# Patient Record
Sex: Female | Born: 1940 | Race: White | Hispanic: No | Smoking: Never smoker
Health system: Southern US, Community
[De-identification: ages and names within clinical notes are randomized; demographics above are authoritative.]

## PROBLEM LIST (undated history)

## (undated) DIAGNOSIS — Z8674 Personal history of sudden cardiac arrest: Secondary | ICD-10-CM

## (undated) DIAGNOSIS — Z90710 Acquired absence of both cervix and uterus: Secondary | ICD-10-CM

## (undated) DIAGNOSIS — R601 Generalized edema: Secondary | ICD-10-CM

## (undated) DIAGNOSIS — J189 Pneumonia, unspecified organism: Secondary | ICD-10-CM

## (undated) DIAGNOSIS — I2510 Atherosclerotic heart disease of native coronary artery without angina pectoris: Secondary | ICD-10-CM

## (undated) DIAGNOSIS — I10 Essential (primary) hypertension: Secondary | ICD-10-CM

## (undated) DIAGNOSIS — J449 Chronic obstructive pulmonary disease, unspecified: Secondary | ICD-10-CM

## (undated) DIAGNOSIS — J9600 Acute respiratory failure, unspecified whether with hypoxia or hypercapnia: Secondary | ICD-10-CM

## (undated) DIAGNOSIS — I219 Acute myocardial infarction, unspecified: Secondary | ICD-10-CM

## (undated) DIAGNOSIS — Z9049 Acquired absence of other specified parts of digestive tract: Secondary | ICD-10-CM

## (undated) DIAGNOSIS — R579 Shock, unspecified: Secondary | ICD-10-CM

## (undated) DIAGNOSIS — K922 Gastrointestinal hemorrhage, unspecified: Secondary | ICD-10-CM

## (undated) DIAGNOSIS — E876 Hypokalemia: Secondary | ICD-10-CM

## (undated) DIAGNOSIS — Z951 Presence of aortocoronary bypass graft: Secondary | ICD-10-CM

## (undated) DIAGNOSIS — I509 Heart failure, unspecified: Secondary | ICD-10-CM

## (undated) DIAGNOSIS — E46 Unspecified protein-calorie malnutrition: Secondary | ICD-10-CM

## (undated) DIAGNOSIS — K632 Fistula of intestine: Secondary | ICD-10-CM

## (undated) DIAGNOSIS — N189 Chronic kidney disease, unspecified: Secondary | ICD-10-CM

## (undated) DIAGNOSIS — D759 Disease of blood and blood-forming organs, unspecified: Secondary | ICD-10-CM

## (undated) DIAGNOSIS — D649 Anemia, unspecified: Secondary | ICD-10-CM

## (undated) DIAGNOSIS — E872 Acidosis, unspecified: Secondary | ICD-10-CM

## (undated) DIAGNOSIS — N390 Urinary tract infection, site not specified: Secondary | ICD-10-CM

## (undated) DIAGNOSIS — Z8719 Personal history of other diseases of the digestive system: Secondary | ICD-10-CM

## (undated) DIAGNOSIS — A809 Acute poliomyelitis, unspecified: Secondary | ICD-10-CM

## (undated) DIAGNOSIS — Z9889 Other specified postprocedural states: Secondary | ICD-10-CM

## (undated) HISTORY — PX: ABDOMINAL HYSTERECTOMY: SHX81

## (undated) HISTORY — PX: OTHER SURGICAL HISTORY: SHX169

## (undated) HISTORY — PX: TONSILLECTOMY: SUR1361

---
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
## 1996-06-06 HISTORY — PX: CORONARY ARTERY BYPASS GRAFT: SHX141

## 1998-06-16 ENCOUNTER — Observation Stay (HOSPITAL_COMMUNITY): Admission: AD | Admit: 1998-06-16 | Discharge: 1998-06-17 | Payer: Self-pay | Admitting: Cardiology

## 1998-06-16 ENCOUNTER — Encounter: Payer: Self-pay | Admitting: Cardiology

## 2005-10-13 ENCOUNTER — Encounter: Admission: RE | Admit: 2005-10-13 | Discharge: 2005-10-13 | Payer: Self-pay | Admitting: Neurosurgery

## 2005-10-27 ENCOUNTER — Encounter: Admission: RE | Admit: 2005-10-27 | Discharge: 2005-10-27 | Payer: Self-pay | Admitting: Neurosurgery

## 2005-11-16 ENCOUNTER — Encounter: Admission: RE | Admit: 2005-11-16 | Discharge: 2005-11-16 | Payer: Self-pay | Admitting: Neurosurgery

## 2013-08-04 DIAGNOSIS — D649 Anemia, unspecified: Secondary | ICD-10-CM

## 2013-08-04 DIAGNOSIS — J189 Pneumonia, unspecified organism: Secondary | ICD-10-CM

## 2013-08-04 DIAGNOSIS — D759 Disease of blood and blood-forming organs, unspecified: Secondary | ICD-10-CM

## 2013-08-04 DIAGNOSIS — J9600 Acute respiratory failure, unspecified whether with hypoxia or hypercapnia: Secondary | ICD-10-CM

## 2013-08-04 DIAGNOSIS — R579 Shock, unspecified: Secondary | ICD-10-CM

## 2013-08-04 DIAGNOSIS — E46 Unspecified protein-calorie malnutrition: Secondary | ICD-10-CM

## 2013-08-04 HISTORY — DX: Shock, unspecified: R57.9

## 2013-08-04 HISTORY — DX: Anemia, unspecified: D64.9

## 2013-08-04 HISTORY — DX: Disease of blood and blood-forming organs, unspecified: D75.9

## 2013-08-04 HISTORY — DX: Pneumonia, unspecified organism: J18.9

## 2013-08-04 HISTORY — DX: Unspecified protein-calorie malnutrition: E46

## 2013-08-04 HISTORY — DX: Acute respiratory failure, unspecified whether with hypoxia or hypercapnia: J96.00

## 2013-08-07 ENCOUNTER — Inpatient Hospital Stay (HOSPITAL_COMMUNITY): Payer: Medicare Other

## 2013-08-07 ENCOUNTER — Encounter (HOSPITAL_COMMUNITY): Payer: Self-pay | Admitting: General Practice

## 2013-08-07 ENCOUNTER — Inpatient Hospital Stay (HOSPITAL_COMMUNITY)
Admission: EM | Admit: 2013-08-07 | Discharge: 2013-09-04 | DRG: 004 | Disposition: E | Payer: Medicare Other | Source: Other Acute Inpatient Hospital | Attending: Pulmonary Disease | Admitting: Pulmonary Disease

## 2013-08-07 DIAGNOSIS — E2749 Other adrenocortical insufficiency: Secondary | ICD-10-CM | POA: Diagnosis not present

## 2013-08-07 DIAGNOSIS — E872 Acidosis, unspecified: Secondary | ICD-10-CM | POA: Diagnosis present

## 2013-08-07 DIAGNOSIS — B952 Enterococcus as the cause of diseases classified elsewhere: Secondary | ICD-10-CM | POA: Diagnosis present

## 2013-08-07 DIAGNOSIS — R609 Edema, unspecified: Secondary | ICD-10-CM

## 2013-08-07 DIAGNOSIS — K838 Other specified diseases of biliary tract: Secondary | ICD-10-CM | POA: Diagnosis not present

## 2013-08-07 DIAGNOSIS — R7309 Other abnormal glucose: Secondary | ICD-10-CM | POA: Diagnosis not present

## 2013-08-07 DIAGNOSIS — Z23 Encounter for immunization: Secondary | ICD-10-CM

## 2013-08-07 DIAGNOSIS — K560 Paralytic ileus: Secondary | ICD-10-CM | POA: Diagnosis present

## 2013-08-07 DIAGNOSIS — K7200 Acute and subacute hepatic failure without coma: Secondary | ICD-10-CM | POA: Diagnosis present

## 2013-08-07 DIAGNOSIS — I214 Non-ST elevation (NSTEMI) myocardial infarction: Secondary | ICD-10-CM | POA: Diagnosis present

## 2013-08-07 DIAGNOSIS — R601 Generalized edema: Secondary | ICD-10-CM

## 2013-08-07 DIAGNOSIS — G9341 Metabolic encephalopathy: Secondary | ICD-10-CM | POA: Diagnosis present

## 2013-08-07 DIAGNOSIS — K5732 Diverticulitis of large intestine without perforation or abscess without bleeding: Secondary | ICD-10-CM

## 2013-08-07 DIAGNOSIS — K632 Fistula of intestine: Secondary | ICD-10-CM

## 2013-08-07 DIAGNOSIS — N189 Chronic kidney disease, unspecified: Secondary | ICD-10-CM | POA: Diagnosis present

## 2013-08-07 DIAGNOSIS — J4489 Other specified chronic obstructive pulmonary disease: Secondary | ICD-10-CM | POA: Diagnosis present

## 2013-08-07 DIAGNOSIS — N179 Acute kidney failure, unspecified: Secondary | ICD-10-CM | POA: Diagnosis present

## 2013-08-07 DIAGNOSIS — J9600 Acute respiratory failure, unspecified whether with hypoxia or hypercapnia: Secondary | ICD-10-CM

## 2013-08-07 DIAGNOSIS — J449 Chronic obstructive pulmonary disease, unspecified: Secondary | ICD-10-CM | POA: Diagnosis present

## 2013-08-07 DIAGNOSIS — J9620 Acute and chronic respiratory failure, unspecified whether with hypoxia or hypercapnia: Secondary | ICD-10-CM | POA: Diagnosis present

## 2013-08-07 DIAGNOSIS — N170 Acute kidney failure with tubular necrosis: Secondary | ICD-10-CM | POA: Diagnosis present

## 2013-08-07 DIAGNOSIS — I129 Hypertensive chronic kidney disease with stage 1 through stage 4 chronic kidney disease, or unspecified chronic kidney disease: Secondary | ICD-10-CM | POA: Diagnosis present

## 2013-08-07 DIAGNOSIS — E8770 Fluid overload, unspecified: Secondary | ICD-10-CM | POA: Diagnosis present

## 2013-08-07 DIAGNOSIS — I509 Heart failure, unspecified: Secondary | ICD-10-CM | POA: Diagnosis present

## 2013-08-07 DIAGNOSIS — K5792 Diverticulitis of intestine, part unspecified, without perforation or abscess without bleeding: Secondary | ICD-10-CM | POA: Diagnosis present

## 2013-08-07 DIAGNOSIS — E46 Unspecified protein-calorie malnutrition: Secondary | ICD-10-CM

## 2013-08-07 DIAGNOSIS — R6520 Severe sepsis without septic shock: Secondary | ICD-10-CM

## 2013-08-07 DIAGNOSIS — E875 Hyperkalemia: Secondary | ICD-10-CM | POA: Diagnosis present

## 2013-08-07 DIAGNOSIS — J189 Pneumonia, unspecified organism: Secondary | ICD-10-CM | POA: Diagnosis present

## 2013-08-07 DIAGNOSIS — R6521 Severe sepsis with septic shock: Secondary | ICD-10-CM

## 2013-08-07 DIAGNOSIS — I252 Old myocardial infarction: Secondary | ICD-10-CM

## 2013-08-07 DIAGNOSIS — E440 Moderate protein-calorie malnutrition: Secondary | ICD-10-CM | POA: Diagnosis present

## 2013-08-07 DIAGNOSIS — E8779 Other fluid overload: Secondary | ICD-10-CM

## 2013-08-07 DIAGNOSIS — E871 Hypo-osmolality and hyponatremia: Secondary | ICD-10-CM | POA: Diagnosis not present

## 2013-08-07 DIAGNOSIS — I959 Hypotension, unspecified: Secondary | ICD-10-CM | POA: Diagnosis present

## 2013-08-07 DIAGNOSIS — Z79899 Other long term (current) drug therapy: Secondary | ICD-10-CM

## 2013-08-07 DIAGNOSIS — I2510 Atherosclerotic heart disease of native coronary artery without angina pectoris: Secondary | ICD-10-CM | POA: Diagnosis present

## 2013-08-07 DIAGNOSIS — Z951 Presence of aortocoronary bypass graft: Secondary | ICD-10-CM

## 2013-08-07 DIAGNOSIS — D696 Thrombocytopenia, unspecified: Secondary | ICD-10-CM | POA: Diagnosis present

## 2013-08-07 DIAGNOSIS — D649 Anemia, unspecified: Secondary | ICD-10-CM | POA: Diagnosis present

## 2013-08-07 DIAGNOSIS — A419 Sepsis, unspecified organism: Principal | ICD-10-CM | POA: Diagnosis present

## 2013-08-07 DIAGNOSIS — R570 Cardiogenic shock: Secondary | ICD-10-CM | POA: Diagnosis present

## 2013-08-07 DIAGNOSIS — J811 Chronic pulmonary edema: Secondary | ICD-10-CM

## 2013-08-07 DIAGNOSIS — J9690 Respiratory failure, unspecified, unspecified whether with hypoxia or hypercapnia: Secondary | ICD-10-CM | POA: Diagnosis present

## 2013-08-07 DIAGNOSIS — N390 Urinary tract infection, site not specified: Secondary | ICD-10-CM | POA: Diagnosis present

## 2013-08-07 DIAGNOSIS — Z66 Do not resuscitate: Secondary | ICD-10-CM | POA: Diagnosis present

## 2013-08-07 DIAGNOSIS — I5043 Acute on chronic combined systolic (congestive) and diastolic (congestive) heart failure: Secondary | ICD-10-CM | POA: Diagnosis present

## 2013-08-07 DIAGNOSIS — Z515 Encounter for palliative care: Secondary | ICD-10-CM

## 2013-08-07 DIAGNOSIS — R579 Shock, unspecified: Secondary | ICD-10-CM | POA: Diagnosis present

## 2013-08-07 HISTORY — DX: Chronic kidney disease, unspecified: N18.9

## 2013-08-07 HISTORY — DX: Acidosis, unspecified: E87.20

## 2013-08-07 HISTORY — DX: Generalized edema: R60.1

## 2013-08-07 HISTORY — DX: Unspecified protein-calorie malnutrition: E46

## 2013-08-07 HISTORY — DX: Acute poliomyelitis, unspecified: A80.9

## 2013-08-07 HISTORY — DX: Acute myocardial infarction, unspecified: I21.9

## 2013-08-07 HISTORY — DX: Acidosis: E87.2

## 2013-08-07 HISTORY — DX: Gastrointestinal hemorrhage, unspecified: K92.2

## 2013-08-07 HISTORY — DX: Hypokalemia: E87.6

## 2013-08-07 HISTORY — DX: Fistula of intestine: K63.2

## 2013-08-07 HISTORY — DX: Personal history of other diseases of the digestive system: Z87.19

## 2013-08-07 HISTORY — DX: Essential (primary) hypertension: I10

## 2013-08-07 HISTORY — DX: Atherosclerotic heart disease of native coronary artery without angina pectoris: I25.10

## 2013-08-07 HISTORY — DX: Shock, unspecified: R57.9

## 2013-08-07 HISTORY — DX: Heart failure, unspecified: I50.9

## 2013-08-07 HISTORY — DX: Personal history of sudden cardiac arrest: Z86.74

## 2013-08-07 HISTORY — DX: Acquired absence of both cervix and uterus: Z90.710

## 2013-08-07 HISTORY — DX: Chronic obstructive pulmonary disease, unspecified: J44.9

## 2013-08-07 HISTORY — DX: Urinary tract infection, site not specified: N39.0

## 2013-08-07 HISTORY — DX: Disease of blood and blood-forming organs, unspecified: D75.9

## 2013-08-07 HISTORY — DX: Acute respiratory failure, unspecified whether with hypoxia or hypercapnia: J96.00

## 2013-08-07 HISTORY — DX: Acquired absence of other specified parts of digestive tract: Z90.49

## 2013-08-07 HISTORY — DX: Other specified postprocedural states: Z98.890

## 2013-08-07 HISTORY — DX: Pneumonia, unspecified organism: J18.9

## 2013-08-07 HISTORY — DX: Presence of aortocoronary bypass graft: Z95.1

## 2013-08-07 HISTORY — DX: Anemia, unspecified: D64.9

## 2013-08-07 LAB — GLUCOSE, CAPILLARY
GLUCOSE-CAPILLARY: 142 mg/dL — AB (ref 70–99)
GLUCOSE-CAPILLARY: 126 mg/dL — AB (ref 70–99)
Glucose-Capillary: 106 mg/dL — ABNORMAL HIGH (ref 70–99)
Glucose-Capillary: 114 mg/dL — ABNORMAL HIGH (ref 70–99)

## 2013-08-07 LAB — URINALYSIS, ROUTINE W REFLEX MICROSCOPIC
Glucose, UA: 100 mg/dL — AB
Ketones, ur: 15 mg/dL — AB
Nitrite: POSITIVE — AB
PH: 7 (ref 5.0–8.0)
Protein, ur: >300 mg/dL — AB
Specific Gravity, Urine: 1.02 (ref 1.005–1.030)
Urobilinogen, UA: >8 mg/dL — ABNORMAL HIGH (ref 0.0–1.0)

## 2013-08-07 LAB — POCT I-STAT 3, ART BLOOD GAS (G3+)
Acid-base deficit: 7 mmol/L — ABNORMAL HIGH (ref 0.0–2.0)
Bicarbonate: 19 mEq/L — ABNORMAL LOW (ref 20.0–24.0)
O2 SAT: 92 %
TCO2: 20 mmol/L (ref 0–100)
pCO2 arterial: 37.3 mmHg (ref 35.0–45.0)
pH, Arterial: 7.315 — ABNORMAL LOW (ref 7.350–7.450)
pO2, Arterial: 67 mmHg — ABNORMAL LOW (ref 80.0–100.0)

## 2013-08-07 LAB — URINE MICROSCOPIC-ADD ON

## 2013-08-07 LAB — COMPREHENSIVE METABOLIC PANEL
ALK PHOS: 102 U/L (ref 39–117)
ALT: 38 U/L — ABNORMAL HIGH (ref 0–35)
AST: 446 U/L — AB (ref 0–37)
Albumin: 2.4 g/dL — ABNORMAL LOW (ref 3.5–5.2)
BILIRUBIN TOTAL: 1.6 mg/dL — AB (ref 0.3–1.2)
BUN: 87 mg/dL — ABNORMAL HIGH (ref 6–23)
CHLORIDE: 100 meq/L (ref 96–112)
CO2: 19 meq/L (ref 19–32)
Calcium: 7.9 mg/dL — ABNORMAL LOW (ref 8.4–10.5)
Creatinine, Ser: 2.31 mg/dL — ABNORMAL HIGH (ref 0.50–1.10)
GFR calc Af Amer: 23 mL/min — ABNORMAL LOW (ref >90)
GFR, EST NON AFRICAN AMERICAN: 20 mL/min — AB (ref >90)
Glucose, Bld: 116 mg/dL — ABNORMAL HIGH (ref 70–99)
Potassium: 5.7 mEq/L — ABNORMAL HIGH (ref 3.7–5.3)
Sodium: 136 mEq/L — ABNORMAL LOW (ref 137–147)
Total Protein: 4.6 g/dL — ABNORMAL LOW (ref 6.0–8.3)

## 2013-08-07 LAB — BASIC METABOLIC PANEL
BUN: 85 mg/dL — AB (ref 6–23)
CHLORIDE: 100 meq/L (ref 96–112)
CO2: 19 meq/L (ref 19–32)
Calcium: 7.9 mg/dL — ABNORMAL LOW (ref 8.4–10.5)
Creatinine, Ser: 2.29 mg/dL — ABNORMAL HIGH (ref 0.50–1.10)
GFR calc Af Amer: 24 mL/min — ABNORMAL LOW (ref >90)
GFR, EST NON AFRICAN AMERICAN: 20 mL/min — AB (ref >90)
GLUCOSE: 111 mg/dL — AB (ref 70–99)
POTASSIUM: 5.7 meq/L — AB (ref 3.7–5.3)
SODIUM: 135 meq/L — AB (ref 137–147)

## 2013-08-07 LAB — CBC
HCT: 26.3 % — ABNORMAL LOW (ref 36.0–46.0)
Hemoglobin: 9.3 g/dL — ABNORMAL LOW (ref 12.0–15.0)
MCH: 30.4 pg (ref 26.0–34.0)
MCHC: 35.4 g/dL (ref 30.0–36.0)
MCV: 85.9 fL (ref 78.0–100.0)
PLATELETS: 206 10*3/uL (ref 150–400)
RBC: 3.06 MIL/uL — ABNORMAL LOW (ref 3.87–5.11)
RDW: 17.6 % — AB (ref 11.5–15.5)
WBC: 34 10*3/uL — ABNORMAL HIGH (ref 4.0–10.5)

## 2013-08-07 LAB — MRSA PCR SCREENING: MRSA by PCR: NEGATIVE

## 2013-08-07 MED ORDER — FUROSEMIDE 10 MG/ML IJ SOLN
160.0000 mg | Freq: Four times a day (QID) | INTRAMUSCULAR | Status: DC
  Administered 2013-08-07 – 2013-08-08 (×3): 160 mg via INTRAVENOUS
  Filled 2013-08-07 (×5): qty 16

## 2013-08-07 MED ORDER — BIOTENE DRY MOUTH MT LIQD
15.0000 mL | Freq: Four times a day (QID) | OROMUCOSAL | Status: DC
  Administered 2013-08-07 – 2013-08-23 (×63): 15 mL via OROMUCOSAL

## 2013-08-07 MED ORDER — SODIUM CHLORIDE 0.9 % IV SOLN
INTRAVENOUS | Status: DC
  Administered 2013-08-07 (×2): via INTRAVENOUS
  Administered 2013-08-15: 250 mL via INTRAVENOUS
  Administered 2013-08-18 – 2013-08-22 (×3): via INTRAVENOUS

## 2013-08-07 MED ORDER — PANTOPRAZOLE SODIUM 40 MG IV SOLR
40.0000 mg | INTRAVENOUS | Status: DC
  Administered 2013-08-07 – 2013-08-22 (×16): 40 mg via INTRAVENOUS
  Filled 2013-08-07 (×17): qty 40

## 2013-08-07 MED ORDER — PNEUMOCOCCAL VAC POLYVALENT 25 MCG/0.5ML IJ INJ
0.5000 mL | INJECTION | INTRAMUSCULAR | Status: AC
  Administered 2013-08-08: 0.5 mL via INTRAMUSCULAR
  Filled 2013-08-07: qty 0.5

## 2013-08-07 MED ORDER — VANCOMYCIN HCL IN DEXTROSE 1-5 GM/200ML-% IV SOLN
1000.0000 mg | INTRAVENOUS | Status: DC
  Administered 2013-08-07 – 2013-08-10 (×4): 1000 mg via INTRAVENOUS
  Filled 2013-08-07 (×5): qty 200

## 2013-08-07 MED ORDER — SODIUM CHLORIDE 0.9 % IV SOLN
0.0000 ug/h | INTRAVENOUS | Status: DC
  Administered 2013-08-07: 100 ug/h via INTRAVENOUS
  Filled 2013-08-07 (×3): qty 50

## 2013-08-07 MED ORDER — SODIUM CHLORIDE 0.9 % IV SOLN
250.0000 mg | Freq: Three times a day (TID) | INTRAVENOUS | Status: DC
  Administered 2013-08-07 – 2013-08-08 (×5): 250 mg via INTRAVENOUS
  Filled 2013-08-07 (×6): qty 250

## 2013-08-07 MED ORDER — HEPARIN SODIUM (PORCINE) 5000 UNIT/ML IJ SOLN
5000.0000 [IU] | Freq: Three times a day (TID) | INTRAMUSCULAR | Status: DC
  Administered 2013-08-07 – 2013-08-10 (×8): 5000 [IU] via SUBCUTANEOUS
  Filled 2013-08-07 (×12): qty 1

## 2013-08-07 MED ORDER — FENTANYL BOLUS VIA INFUSION
25.0000 ug | INTRAVENOUS | Status: DC | PRN
  Filled 2013-08-07: qty 50

## 2013-08-07 MED ORDER — FENTANYL CITRATE 0.05 MG/ML IJ SOLN
50.0000 ug | Freq: Once | INTRAMUSCULAR | Status: DC
  Filled 2013-08-07: qty 2

## 2013-08-07 MED ORDER — CHLORHEXIDINE GLUCONATE 0.12 % MT SOLN
15.0000 mL | Freq: Two times a day (BID) | OROMUCOSAL | Status: DC
  Administered 2013-08-07 – 2013-08-23 (×34): 15 mL via OROMUCOSAL
  Filled 2013-08-07 (×31): qty 15

## 2013-08-07 MED FILL — Fentanyl Citrate Inj 0.05 MG/ML: INTRAMUSCULAR | Qty: 2 | Status: AC

## 2013-08-07 MED FILL — Midazolam HCl Inj 5 MG/5ML (Base Equivalent): INTRAMUSCULAR | Qty: 5 | Status: AC

## 2013-08-07 NOTE — Progress Notes (Signed)
ANTIBIOTIC CONSULT NOTE - INITIAL  Pharmacy Consult for vancomycin and imipenem Indication: intraabdominal infection; UTI with enterococcus  Allergies  Allergen Reactions  . Cephalexin Other (See Comments)    Unknown  . Codeine Nausea And Vomiting    Patient Measurements: Height: 5\' 1"  (154.9 cm) Weight: 189 lb 9.5 oz (86 kg) IBW/kg (Calculated) : 47.8   Vital Signs: Temp: 97.8 F (36.6 C) (03/04 1124) Temp src: Oral (03/04 1124) BP: 118/70 mmHg (03/04 1530) Pulse Rate: 76 (03/04 1530) Intake/Output from previous day:   Intake/Output from this shift: Total I/O In: 70 [I.V.:40; NG/GT:30] Out: 550 [Urine:50; Emesis/NG output:50; Other:350; Stool:100]  Labs:  Recent Labs  14-Dec-2013 1200  WBC 34.0*  HGB 9.3*  PLT 206  CREATININE 2.31*   Estimated Creatinine Clearance: 22.3 ml/min (by C-G formula based on Cr of 2.31). No results found for this basename: VANCOTROUGH, Leodis BinetVANCOPEAK, VANCORANDOM, GENTTROUGH, GENTPEAK, GENTRANDOM, TOBRATROUGH, TOBRAPEAK, TOBRARND, AMIKACINPEAK, AMIKACINTROU, AMIKACIN,  in the last 72 hours   Microbiology: Recent Results (from the past 720 hour(s))  MRSA PCR SCREENING     Status: None   Collection Time    14-Dec-2013 11:24 AM      Result Value Ref Range Status   MRSA by PCR NEGATIVE  NEGATIVE Final   Comment:            The GeneXpert MRSA Assay (FDA     approved for NASAL specimens     only), is one component of a     comprehensive MRSA colonization     surveillance program. It is not     intended to diagnose MRSA     infection nor to guide or     monitor treatment for     MRSA infections.    Medical History: Past Medical History  Diagnosis Date  . COPD (chronic obstructive pulmonary disease)   . CHF (congestive heart failure)   . 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 Hypertension   . Coronary artery disease   . Hypokalemia   . Acute respiratory failure 08/2013  . Malnutrition 08/2013    OF MODERATE  DEGREE  . Pneumonia 08/2013  . Anemia 08/2013    ACUTE  . Acidosis   . Myocardial infarction     NSTEMI  . Chronic kidney disease     CKD  . Blood dyscrasia 08/2013    LEUKOCYTOSIS  . UTI (lower urinary tract infection)   . Shock 08/2013    RESOLVED  . Anasarca   . GI bleed     Assessment: 73 yo F [facility]. Pharmacy consulted to dose imipenem and vancomycin for intraabdominal infection and enterococcus UTI. [facility]: Pt on primaxin 250 mg IV q6h, last dose this am at 0700am Pt on levaquin 750 mg IV q48hr, last dose 3/4 at 0600 Pt on vancomycin 1000 mg IV q24, last dose 3/2 at 0700 Pt on diflucan 200 mg IV q24.  Pt s/p exp lap w/ SBR, LOA on 2/13 for enterocolonic fistula.  Course complicated by ileus, renal failure, cardiopulmonary arrest/coded on 2/22, then to ICU and intubated.\ WBC 34, creat 2.31, wt 86 kg. AF.    Goal of Therapy:  Vancomycin trough level 15-20 mcg/ml  Plan:  1. Imipenem 250 mg IV q8h 2. Vancomycin 1000 mg IV q24 3. F/u renal function; temp, wbc, culture data, clinical course 4. Steady-state vanc trough as needed  Herby AbrahamMichelle T. Donis Kotowski, Pharm.D. [phone]  08/05/2013 4:28 PM

## 2013-08-07 NOTE — H&P (Signed)
Patient unable to answer questions at this time . History taken from MD notes [facility]

## 2013-08-07 NOTE — Consult Note (Signed)
Patient interviewed and examined, agree with PA note above.  Difficult situation with enterocutaneous fistula several weeks following laparotomy, transferred in with multiple medical problems including ventilator dependent respiratory failure, renal failure and anasarca. She certainly would not be a candidate for surgical intervention. Efforts will be directed at wound care and nutritional support and management of her severe medical problems as above. Will review of outside CT scans with radiology to make sure she does not have any collections that required drainage.  Mariella SaaBenjamin T Mikias Lanz MD, FACS  08/30/2013 7:55 PM

## 2013-08-07 NOTE — H&P (Signed)
Name: Leah [name] MRN: [id number] DOB: November 26, 1940    ADMISSION DATE:  08/19/2013  REFERRING MD :  [facility]  PRIMARY SERVICE: PCCM  CHIEF COMPLAINT:  Respiratory Failure   BRIEF PATIENT DESCRIPTION: 73 y/o F admitted to OSH for planned repair of enterocolonic fistula.  Course complicated by ileus, renal fx, cardiopulmonary arrest.  Tx to [facility] on 3/4 for further care.   SIGNIFICANT EVENTS / STUDIES:  2/13 - 3/4:  Admit to [facility] for surgical repair of enterorectal fistula.  Course complicated by ileus with n/v, acute renal failure, respiratory arrest.  F/U CT ABD with enterocutaneous fistula.    2/13 - Ex-lap with SB resection, extensive lysis of adhesions 2/16 - improved per notes, ambulating 2/21 - urinary retention, increased SOB, vomiting August 16, 2022 - POD #9, ongoing urinary retention, ambulating, concern for pos-op ileus.  CODED 08/16/2022 PM, to ICU & intubated ............................................................................................................................................................................................. 3/4 - Progressive decline, acute on chronic renal injury, hypervolemia, increased WBC's  LINES / TUBES: OETT 2/22>>> R IJ TLC 2/14 (?)>>>3/4 L IJ TLC 3/4>>>  CULTURES: UC (OSH)>>>enterococcus faecalis>>>sens imipenem, resistant to levaquin/diflucan  ............................................................................................................................................ 3/4 BCx2>>> 3/4 UC>>> 3/4 Sputum>>>  ANTIBIOTICS: (pt was on diflucan, vanco, imipenem at OSH) Vanco 3/4>>> Imipenem 3/4>>>  HISTORY OF PRESENT ILLNESS:  73 y/o F with PMH of COPD, CHF, HTN, CKD (not on HD) who was initially admitted to [facility] for planned repair of enterocolonic fistula on 07/19/13.  She initially had improvement post-op and was ambulating in hall. However, her course complicated by ileus with n/v, acute renal  failure & enterococcal UTI (See sensitivities as above).  F/U CT ABD with enterocutaneous fistula.  On post op day #9 she had progressive ileus, worsening SOB and had cardiopulmonary arrest.  Patient was transferred to ICU / intubated.  She had progressive volume overload & worsening renal failure despite IV lasix gtt (10 mg/hr). On admission, she weighed 119 lbs and on arrival to [facility] she weighed 189 lbs.     PAST MEDICAL HISTORY :  Past Medical History  Diagnosis Date  . COPD (chronic obstructive pulmonary disease)   . CHF (congestive heart failure)   . 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 Hypertension   . Coronary artery disease   . Hypokalemia   . Acute respiratory failure 08/2013  . Malnutrition 08/2013    OF MODERATE DEGREE  . Pneumonia 08/2013  . Anemia 08/2013    ACUTE  . Acidosis   . Myocardial infarction     NSTEMI  . Chronic kidney disease     CKD  . Blood dyscrasia 08/2013    LEUKOCYTOSIS  . UTI (lower urinary tract infection)   . Shock 08/2013    RESOLVED  . Anasarca   . GI bleed    Past Surgical History  Procedure Laterality Date  . Colostomy    . Appendectomy  1950  . Coronary artery bypass graft  1998    x4  . Abdominal hysterectomy    . Tonsillectomy    . Enterocolic fistula repair     Prior to Admission medications   Medication Sig Start Date End Date Taking? Authorizing Provider  acetaminophen (TYLENOL) 325 MG tablet Take 650 mg by mouth every 6 (six) hours as needed for fever.   Yes Historical Provider, MD  acetaminophen (TYLENOL) 650 MG suppository Place 650 mg rectally every 6 (six) hours as needed for fever.   Yes Historical Provider, MD  Albuterol Sulfate (  PROVENTIL HFA IN) Inhale 8 puffs into the lungs.   Yes Historical Provider, MD  atorvastatin (LIPITOR) 80 MG tablet Take 80 mg by mouth daily.   Yes Historical Provider, MD  bethanechol (URECHOLINE) 25 MG tablet Take 25 mg by mouth 3 (three) times daily.   Yes Historical  Provider, MD  chlorhexidine (PERIDEX) 0.12 % solution Use as directed 15 mLs in the mouth or throat 2 (two) times daily.   Yes Historical Provider, MD  FLORA-Q [facility]) CAPS capsule Take 1 capsule by mouth 2 (two) times daily.   Yes Historical Provider, MD  hydrocortisone sodium succinate (SOLU-CORTEF) 100 mg/2 mL injection Inject 100 mg into the vein every 8 (eight) hours.   Yes Historical Provider, MD  ipratropium (ATROVENT HFA) 17 MCG/ACT inhaler Inhale 2 puffs into the lungs every 6 (six) hours.   Yes Historical Provider, MD  ipratropium-albuterol (DUONEB) 0.5-2.5 (3) MG/3ML SOLN Take 3 mLs by nebulization every 2 (two) hours as needed.   Yes Historical Provider, MD  lansoprazole (PREVACID SOLUTAB) 30 MG disintegrating tablet Take 30 mg by mouth daily.   Yes Historical Provider, MD  losartan (COZAAR) 25 MG tablet Take 25 mg by mouth daily.   Yes Historical Provider, MD  metoprolol (LOPRESSOR) 50 MG tablet Take 50 mg by mouth 2 (two) times daily.   Yes Historical Provider, MD  nefazodone (SERZONE) 200 MG tablet Take 200 mg by mouth 2 (two) times daily.   Yes Historical Provider, MD  nitroGLYCERIN (NITROSTAT) 0.4 MG SL tablet Place 0.4 mg under the tongue every 5 (five) minutes as needed for chest pain.   Yes Historical Provider, MD  Nutritional Supplements (FEEDING SUPPLEMENT, JEVITY 1.5 CAL,) LIQD Place 1,000 mLs into feeding tube continuous.   Yes Historical Provider, MD  nystatin ointment (MYCOSTATIN) Apply 1 application topically 2 (two) times daily.   Yes Historical Provider, MD  ranolazine (RANEXA) 500 MG 12 hr tablet Take 500 mg by mouth 2 (two) times daily.   Yes Historical Provider, MD  senna-docusate (SENOKOT S) 8.6-50 MG per tablet Take 1 tablet by mouth 2 (two) times daily.   Yes Historical Provider, MD   Allergies  Allergen Reactions  . Cephalexin Other (See Comments)    Unknown    FAMILY HISTORY:  No family history on file. SOCIAL HISTORY:  has no tobacco, alcohol, and drug  history on file.  REVIEW OF SYSTEMS:  Unable to perform, pt on vent.   SUBJECTIVE:   VITAL SIGNS: Temp:  [97.8 F (36.6 C)] 97.8 F (36.6 C) (03/04 1124) Pulse Rate:  [64-96] 64 (03/04 1415) Resp:  [0-26] 0 (03/04 1415) BP: (88-145)/(50-103) 88/50 mmHg (03/04 1415) SpO2:  [92 %-95 %] 95 % (03/04 1415) FiO2 (%):  [30 %] 30 % (03/04 1123) Weight:  [189 lb 9.5 oz (86 kg)] 189 lb 9.5 oz (86 kg) (03/04 1124)  HEMODYNAMICS:    VENTILATOR SETTINGS: Vent Mode:  [-] PRVC FiO2 (%):  [30 %] 30 % Set Rate:  [26 bmp] 26 bmp Vt Set:  [380 mL] 380 mL PEEP:  [5 cmH20] 5 cmH20 Plateau Pressure:  [28 cmH20] 28 cmH20  INTAKE / OUTPUT: Intake/Output     03/03 0701 - 03/04 0700 03/04 0701 - 03/05 0700   I.V. (mL/kg)  10 (0.1)   NG/GT  30   Total Intake(mL/kg)  40 (0.5)   Urine (mL/kg/hr)  50   Total Output   50   Net   -10  PHYSICAL EXAMINATION: General:  Chronically ill in NAD on vent, grossly volume overloaded Neuro:  Arouses, follows commands, generalized weakness  HEENT:  OETT, mm pink/moist Cardiovascular:  s1s2 distant Lungs:  resp's even/non-labored, lungs bilaterally coarse Abdomen:  LLQ colostomy, RLQ fistula, abd dressing c/d/i Musculoskeletal:  No acute deformities Skin:  Anasarca, surgical wound dressed  LABS:  CBC  Recent Labs Lab 08-30-2013 1200  WBC 34.0*  HGB 9.3*  HCT 26.3*  PLT 206   Coag's No results found for this basename: APTT, INR,  in the last 168 hours BMET  Recent Labs Lab August 30, 2013 1200  NA 136*  K 5.7*  CL 100  CO2 19  BUN 87*  CREATININE 2.31*  GLUCOSE 116*   Electrolytes  Recent Labs Lab 30-Aug-2013 1200  CALCIUM 7.9*   Sepsis Markers No results found for this basename: LATICACIDVEN, PROCALCITON, O2SATVEN,  in the last 168 hours ABG  Recent Labs Lab 08/30/2013 1314  PHART 7.315*  PCO2ART 37.3  PO2ART 67.0*   Liver Enzymes  Recent Labs Lab 08/30/13 1200  AST 446*  ALT 38*  ALKPHOS 102  BILITOT 1.6*    ALBUMIN 2.4*   Cardiac Enzymes No results found for this basename: TROPONINI, PROBNP,  in the last 168 hours Glucose  Recent Labs Lab 08-30-13 1123 08-30-13 1200  GLUCAP 142* 126*    Imaging Dg Chest Port 1 View  08/30/2013   CLINICAL DATA:  73 year old female respiratory failure. Initial encounter.  EXAM: PORTABLE CHEST - 1 VIEW  COMPARISON:  0510 hr the same day and earlier.  FINDINGS: Portable AP upright view at 1211 hrs. Endotracheal tube tip in good position between the clavicles and carina. Stable enteric tube coursing to the left upper quadrant. Stable right IJ central line. Sequelae of CABG again noted. Increased EKG leads and wires overlying the chest.  Stable cardiac size and mediastinal contours. Stable to mildly regressed confluent and patchy bilateral perihilar opacity. No pneumothorax. No large effusion.  IMPRESSION: 1.  Stable lines and tubes. 2. No significant change in ventilation with confluent bilateral perihilar opacity, with Main differential considerations of bilateral pneumonia/aspiration and asymmetric pulmonary edema.   Electronically Signed   By: Augusto Gamble M.D.   On: 08/30/13 12:34    ASSESSMENT / PLAN:  PULMONARY A: Acute Respiratory Failure Pulmonary Edema ? 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  Ultimately will need PICC line for long term TNA to allow for EC fistula to close -CCS Consult -IV PPI  HEMATOLOGIC A:   Anemia - s/p transfusion at OSH P:  -trend CBC -tx if Hgb <7%, active bleed or MI <8% -heparin SQ  INFECTIOUS A:   UTI R/O PNA - most likely pulmonary edema P:   -reassess  cultures -change foley -change TLC  -abx as above  ENDOCRINE A:   At Risk Hyper / Hypoglycemia  P:   -SSI   NEUROLOGIC A:   Acute Metabolic Encephalopathy - multifactorial in setting of uremia, ICU delirium, fentanyl P:   -fentanyl gtt -promote sleep / wake cycle minimize sedation as able  GLOBAL  -DNR, full medical care otherwise  Canary BrimBrandi Ollis, NP-C [place] [facility] Pgr: [phone] or [phone]  08/08/2013, 2:28 PM   Reviewed above, examined pt, and agree with assessment/plan.  73 yo with complicated abdominal process, VDRF, AKI, and volume overload.  [facility].  Renal consulted >> try lasix first, but may still need HD at some point.  CCS consulted to assist with post-op management.  Continue TNA.  Continue Abx and repeat cx.  CC time 50 minutes.  Coralyn HellingVineet Marri Mcneff, MD [facility] 08/31/2013, 4:28 PM Pager:  [phone] After 3pm call: [phone]

## 2013-08-07 NOTE — Consult Note (Signed)
HPI: I was asked by Dr. Gar Gibbon to see Leah [name] who is a 73 y.o. female transferred from [facility] after surgical repair of an enterorectal fistula complicated by postop ileus and a cardiopulmonary respiratory arrest requiring CPR. 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  She had prolonged hypotension and was fluid resuscitated and treated with pressors, with an apparent weight change from about 120 pounds up to 180 pounds.  She did not respond well to a furosemide drip at about 10 mg per hour. Nephrology is available at [facility] but was not consulted.  She is a DNR. On 2/24 creat was 1.76m/dl. UOP has decreased and creat rising to 2.379mdl today.  BUN is 78.  She has been receiving steroids. UOP was reportedly 300cc over the last day prior to transfer.  Past Medical History  Diagnosis Date  . COPD (chronic obstructive pulmonary disease)   . CHF (congestive heart failure)   . 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 Hypertension   . Coronary artery disease   . Hypokalemia   . Acute respiratory failure 08/2013  . Malnutrition 08/2013    OF MODERATE DEGREE  . Pneumonia 08/2013  . Anemia 08/2013    ACUTE  . Acidosis   . Myocardial infarction     NSTEMI  . Chronic kidney disease     CKD  . Blood dyscrasia 08/2013    LEUKOCYTOSIS  . UTI (lower urinary tract infection)   . Shock 08/2013    RESOLVED  . Anasarca   . GI bleed    Past Surgical History  Procedure Laterality Date  . Colostomy    . Appendectomy  1950  . Coronary artery bypass graft  1998    x4  . Abdominal hysterectomy    . Tonsillectomy    . Enterocolic fistula repair     Social History:  has no tobacco, alcohol, and drug history on file. Allergies:  Allergies  Allergen Reactions  . Cephalexin Other (See Comments)    Unknown  . Codeine Nausea And Vomiting   No family history on file.  Medications:  Prior to Admission:  Prescriptions prior to admission   Medication Sig Dispense Refill  . acetaminophen (TYLENOL) 325 MG tablet Take 650 mg by mouth every 6 (six) hours as needed for fever.      . Marland Kitchencetaminophen (TYLENOL) 650 MG suppository Place 650 mg rectally every 6 (six) hours as needed for fever.      . Albuterol Sulfate (PROVENTIL HFA IN) Inhale 8 puffs into the lungs.      . Marland Kitchentorvastatin (LIPITOR) 80 MG tablet Take 80 mg by mouth daily.      . bethanechol (URECHOLINE) 25 MG tablet Take 25 mg by mouth 3 (three) times daily.      . chlorhexidine (PERIDEX) 0.12 % solution Use as directed 15 mLs in the mouth or throat 2 (two) times daily.      . Marland KitchenLORA-Q (FLORA-Q) CAPS capsule Take 1 capsule by mouth 2 (two) times daily.      . hydrocortisone sodium succinate (SOLU-CORTEF) 100 mg/2 mL injection Inject 100 mg into the vein every 8 (eight) hours.      . Marland Kitchenpratropium (ATROVENT HFA) 17 MCG/ACT inhaler Inhale 2 puffs into the lungs every 6 (six) hours.      . Marland Kitchenpratropium-albuterol (DUONEB) 0.5-2.5 (3) MG/3ML SOLN Take 3 mLs by nebulization every 2 (two) hours as needed.      . lansoprazole (  PREVACID SOLUTAB) 30 MG disintegrating tablet Take 30 mg by mouth daily.      Marland Kitchen losartan (COZAAR) 25 MG tablet Take 25 mg by mouth daily.      . metoprolol (LOPRESSOR) 50 MG tablet Take 50 mg by mouth 2 (two) times daily.      . nefazodone (SERZONE) 200 MG tablet Take 200 mg by mouth 2 (two) times daily.      . nitroGLYCERIN (NITROSTAT) 0.4 MG SL tablet Place 0.4 mg under the tongue every 5 (five) minutes as needed for chest pain.      . Nutritional Supplements (FEEDING SUPPLEMENT, JEVITY 1.5 CAL,) LIQD Place 1,000 mLs into feeding tube continuous.      Marland Kitchen nystatin ointment (MYCOSTATIN) Apply 1 application topically 2 (two) times daily.      . ranolazine (RANEXA) 500 MG 12 hr tablet Take 500 mg by mouth 2 (two) times daily.      Marland Kitchen senna-docusate (SENOKOT S) 8.6-50 MG per tablet Take 1 tablet by mouth 2 (two) times daily.       ROS: not obtainable  Blood pressure  118/70, pulse 76, temperature 97.8 F (36.6 C), temperature source Oral, resp. rate 26, height _0  (1.549 m), weight 86 kg (189 lb 9.5 oz), SpO2 97.00%.  General appearance: slowed mentation and eyes staring Head: Normocephalic, without obvious abnormality, atraumatic Eyes: negative Ears: normal TM's and external ear canals both ears Throat: intubated Resp: clear to auscultation bilaterally Chest wall: no tenderness Cardio: regular rate and rhythm, S1, S2 normal, no murmur, click, rub or gallop GI: .multiple wounds and bag Extremities: edema diffuse anasacrca Skin: Integrity intact Neurologic: Not following commands  Responds by blinking to threat Results for orders placed during the hospital encounter of 08/31/2013 (from the past 48 hour(s))  GLUCOSE, CAPILLARY     Status: Abnormal   Collection Time    08/04/2013 11:23 AM      Result Value Ref Range   Glucose-Capillary 142 (*) 70 - 99 mg/dL  MRSA PCR SCREENING     Status: None   Collection Time    08/21/2013 11:24 AM      Result Value Ref Range   MRSA by PCR NEGATIVE  NEGATIVE   Comment:            The GeneXpert MRSA Assay (FDA     approved for NASAL specimens     only), is one component of a     comprehensive MRSA colonization     surveillance program. It is not     intended to diagnose MRSA     infection nor to guide or     monitor treatment for     MRSA infections.  URINALYSIS, ROUTINE W REFLEX MICROSCOPIC     Status: Abnormal   Collection Time    08/21/2013 11:26 AM      Result Value Ref Range   Color, Urine BROWN (*) YELLOW   Comment: BIOCHEMICALS MAY BE AFFECTED BY COLOR   APPearance TURBID (*) CLEAR   Specific Gravity, Urine 1.020  1.005 - 1.030   pH 7.0  5.0 - 8.0   Glucose, UA 100 (*) NEGATIVE mg/dL   Hgb urine dipstick LARGE (*) NEGATIVE   Bilirubin Urine LARGE (*) NEGATIVE   Ketones, ur 15 (*) NEGATIVE mg/dL   Protein, ur >300 (*) NEGATIVE mg/dL   Urobilinogen, UA >8.0 (*) 0.0 - 1.0 mg/dL   Nitrite POSITIVE  (*) NEGATIVE   Leukocytes, UA LARGE (*) NEGATIVE  URINE MICROSCOPIC-ADD  ON     Status: Abnormal   Collection Time    08/20/2013 11:26 AM      Result Value Ref Range   Squamous Epithelial / LPF FEW (*) RARE   WBC, UA 7-10  <3 WBC/hpf   RBC / HPF 7-10  <3 RBC/hpf   Bacteria, UA MANY (*) RARE   Casts GRANULAR CAST (*) NEGATIVE   Urine-Other RARE YEAST     Comment: LESS THAN 10 mL OF URINE SUBMITTED     MICROSCOPIC EXAM PERFORMED ON UNCONCENTRATED URINE  COMPREHENSIVE METABOLIC PANEL     Status: Abnormal   Collection Time    08/12/2013 12:00 PM      Result Value Ref Range   Sodium 136 (*) 137 - 147 mEq/L   Potassium 5.7 (*) 3.7 - 5.3 mEq/L   Chloride 100  96 - 112 mEq/L   CO2 19  19 - 32 mEq/L   Glucose, Bld 116 (*) 70 - 99 mg/dL   BUN 87 (*) 6 - 23 mg/dL   Creatinine, Ser 2.31 (*) 0.50 - 1.10 mg/dL   Calcium 7.9 (*) 8.4 - 10.5 mg/dL   Total Protein 4.6 (*) 6.0 - 8.3 g/dL   Albumin 2.4 (*) 3.5 - 5.2 g/dL   AST 446 (*) 0 - 37 U/L   ALT 38 (*) 0 - 35 U/L   Alkaline Phosphatase 102  39 - 117 U/L   Total Bilirubin 1.6 (*) 0.3 - 1.2 mg/dL   GFR calc non Af Amer 20 (*) >90 mL/min   GFR calc Af Amer 23 (*) >90 mL/min   Comment: (NOTE)     The eGFR has been calculated using the CKD EPI equation.     This calculation has not been validated in all clinical situations.     eGFR's persistently <90 mL/min signify possible Chronic Kidney     Disease.  CBC     Status: Abnormal   Collection Time    08/10/2013 12:00 PM      Result Value Ref Range   WBC 34.0 (*) 4.0 - 10.5 K/uL   Comment: REPEATED TO VERIFY   RBC 3.06 (*) 3.87 - 5.11 MIL/uL   Hemoglobin 9.3 (*) 12.0 - 15.0 g/dL   HCT 26.3 (*) 36.0 - 46.0 %   MCV 85.9  78.0 - 100.0 fL   MCH 30.4  26.0 - 34.0 pg   MCHC 35.4  30.0 - 36.0 g/dL   RDW 17.6 (*) 11.5 - 15.5 %   Platelets 206  150 - 400 K/uL  GLUCOSE, CAPILLARY     Status: Abnormal   Collection Time    08/05/2013 12:00 PM      Result Value Ref Range   Glucose-Capillary 126 (*) 70 -  99 mg/dL  POCT I-STAT 3, BLOOD GAS (G3+)     Status: Abnormal   Collection Time    08/29/2013  1:14 PM      Result Value Ref Range   pH, Arterial 7.315 (*) 7.350 - 7.450   pCO2 arterial 37.3  35.0 - 45.0 mmHg   pO2, Arterial 67.0 (*) 80.0 - 100.0 mmHg   Bicarbonate 19.0 (*) 20.0 - 24.0 mEq/L   TCO2 20  0 - 100 mmol/L   O2 Saturation 92.0     Acid-base deficit 7.0 (*) 0.0 - 2.0 mmol/L   Patient temperature 97.9 F     Collection site RADIAL, ALLEN'S TEST ACCEPTABLE     Drawn by Operator     Sample  type ARTERIAL     Dg Chest Port 1 View  08/08/2013   CLINICAL DATA:  73 year old female respiratory failure. Initial encounter.  EXAM: PORTABLE CHEST - 1 VIEW  COMPARISON:  0510 hr the same day and earlier.  FINDINGS: Portable AP upright view at 1211 hrs. Endotracheal tube tip in good position between the clavicles and carina. Stable enteric tube coursing to the left upper quadrant. Stable right IJ central line. Sequelae of CABG again noted. Increased EKG leads and wires overlying the chest.  Stable cardiac size and mediastinal contours. Stable to mildly regressed confluent and patchy bilateral perihilar opacity. No pneumothorax. No large effusion.  IMPRESSION: 1.  Stable lines and tubes. 2. No significant change in ventilation with confluent bilateral perihilar opacity, with Main differential considerations of bilateral pneumonia/aspiration and asymmetric pulmonary edema.   Electronically Signed   By: Lars Pinks M.D.   On: 08/13/2013 12:34    Assessment:  1 Oliguric AKI, hemodynamically mediated 2 Volume overload, severe 3 Acidosis, metabolic 4 Hyperkalemia  Plan: 1 Higher dose diuretic trial, furosemide 160 mg IV q 6 hours. 2 If dialytic intervention considered, will need to know goals of care for realistic duration of treatment.  If needed, CVVHD best option given lower BP.  Kristiana Jacko C 08/21/2013, 4:08 PM

## 2013-08-07 NOTE — Procedures (Signed)
Central Venous Catheter Insertion Procedure Note Deveron FurlongSandra K Douds [id number] 02/22/1941  Procedure: Insertion of Central Venous Catheter Indications: Drug and/or fluid administration  Procedure Details Consent: Risks of procedure as well as the alternatives and risks of each were explained to the (patient/caregiver).  Consent for procedure obtained. Time Out: Verified patient identification, verified procedure, site/side was marked, verified correct patient position, special equipment/implants available, medications/allergies/relevent history reviewed, required imaging and test results available.  Performed  Maximum sterile technique was used including antiseptics, cap, gloves, gown, hand hygiene, mask and sheet. Skin prep: Chlorhexidine; local anesthetic administered A antimicrobial bonded/coated triple lumen catheter was placed in the left internal jugular vein using the Seldinger technique.  Evaluation Blood flow good Complications: No apparent complications Patient did tolerate procedure well. Chest X-ray ordered to verify placement.  CXR: pending.  Performed under direct MD supervision using ultrasound guidance.  Wire visualized in vessel on u/s.   Placed by Rutherford Guysahul Desai - PA

## 2013-08-07 NOTE — Consult Note (Signed)
Leah [name] 05/06/1941  [id number].   Primary Care MD: unknown Requesting MD: Dr. Chesley Mires Chief Complaint/Reason for Consult: management of surgical complications HPI: All history essentially obtained from the chart as patient is on the ventilator.  The patient had a prior Hartman's procedure for diverticulitis.  She developed, over time, an rectoenteric fistula.  She went in on 07-19-13 for a planned takedown of this.  She had a laparotomy with LOA, SBR, and takedown of fistula completed.  She did well initially but developed an ileus, had N/V, and aspirated.  She then had cardiopulmonary arrest.  She was intubated and has been on the vent since then.  At some point during these last few weeks, she has developed an enterocutaneous fistula.  She has gone into renal failure and has accrued a 70lb weight gain from fluid retention.  Due to continuing medical problems, she was transferred to [facility] for further care.  We have been asked to assist with surgical care of this patient.  ROS: Unable to obtain as patient is on vent.  History reviewed. No pertinent family history.  Past Medical History  Diagnosis Date  . COPD (chronic obstructive pulmonary disease)   . CHF (congestive heart failure)   . 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 Hypertension   . Coronary artery disease   . Hypokalemia   . Acute respiratory failure 08/2013  . Malnutrition 08/2013    OF MODERATE DEGREE  . Pneumonia 08/2013  . Anemia 08/2013    ACUTE  . Acidosis   . Myocardial infarction     NSTEMI  . Chronic kidney disease     CKD  . Blood dyscrasia 08/2013    LEUKOCYTOSIS  . UTI (lower urinary tract infection)   . Shock 08/2013    RESOLVED  . Anasarca   . GI bleed     Past Surgical History  Procedure Laterality Date  . Sigmoid colectomy with colostomy    . Appendectomy  1950  . Coronary artery bypass graft  1998    x4  . Abdominal hysterectomy    . Tonsillectomy    .  Exploratory laparotomy with small bowel resection, lysis of ahdesions, and takedown of enterorectal fistula      Social History:  has no tobacco, alcohol, and drug history on file.  Allergies:  Allergies  Allergen Reactions  . Cephalexin Other (See Comments)    Unknown  . Codeine Nausea And Vomiting    Medications Prior to Admission  Medication Sig Dispense Refill  . acetaminophen (TYLENOL) 325 MG tablet Take 650 mg by mouth every 6 (six) hours as needed for fever.      Marland Kitchen acetaminophen (TYLENOL) 650 MG suppository Place 650 mg rectally every 6 (six) hours as needed for fever.      . Albuterol Sulfate (PROVENTIL HFA IN) Inhale 8 puffs into the lungs.      Marland Kitchen atorvastatin (LIPITOR) 80 MG tablet Take 80 mg by mouth daily.      . bethanechol (URECHOLINE) 25 MG tablet Take 25 mg by mouth 3 (three) times daily.      . chlorhexidine (PERIDEX) 0.12 % solution Use as directed 15 mLs in the mouth or throat 2 (two) times daily.      Marland Kitchen FLORA-Q (FLORA-Q) CAPS capsule Take 1 capsule by mouth 2 (two) times daily.      . hydrocortisone sodium succinate (SOLU-CORTEF) 100 mg/2 mL injection Inject 100 mg into the  vein every 8 (eight) hours.      Marland Kitchen ipratropium (ATROVENT HFA) 17 MCG/ACT inhaler Inhale 2 puffs into the lungs every 6 (six) hours.      Marland Kitchen ipratropium-albuterol (DUONEB) 0.5-2.5 (3) MG/3ML SOLN Take 3 mLs by nebulization every 2 (two) hours as needed.      . lansoprazole (PREVACID SOLUTAB) 30 MG disintegrating tablet Take 30 mg by mouth daily.      Marland Kitchen losartan (COZAAR) 25 MG tablet Take 25 mg by mouth daily.      . metoprolol (LOPRESSOR) 50 MG tablet Take 50 mg by mouth 2 (two) times daily.      . nefazodone (SERZONE) 200 MG tablet Take 200 mg by mouth 2 (two) times daily.      . nitroGLYCERIN (NITROSTAT) 0.4 MG SL tablet Place 0.4 mg under the tongue every 5 (five) minutes as needed for chest pain.      . Nutritional Supplements (FEEDING SUPPLEMENT, JEVITY 1.5 CAL,) LIQD Place 1,000 mLs into  feeding tube continuous.      Marland Kitchen nystatin ointment (MYCOSTATIN) Apply 1 application topically 2 (two) times daily.      . ranolazine (RANEXA) 500 MG 12 hr tablet Take 500 mg by mouth 2 (two) times daily.      Marland Kitchen senna-docusate (SENOKOT S) 8.6-50 MG per tablet Take 1 tablet by mouth 2 (two) times daily.        Blood pressure 118/70, pulse 76, temperature 97.8 F (36.6 C), temperature source Oral, resp. rate 26, height 5' 1" (1.549 m), weight 189 lb 9.5 oz (86 kg), SpO2 97.00%. Physical Exam: General: obviously edematous ill-appearing white female who is intubated on the ventilator. HEENT: head is normocephalic, atraumatic.  Sclera are noninjected.  PERRL.  Ears and nose without any masses or lesions.  Mouth has ET tube in place Heart: regular, rate, and rhythm.  Normal s1,s2. No obvious murmurs, gallops, or rubs noted.  Palpable radial and pedal pulses bilaterally Lungs: patient on ventilator, relatively clear with anterior ausculation. Abd: extensive anasarca, abdomen is distended secondary to anasarca.  Tenderness unable to be determined due to mental state, few BS.  Midline wound with staples still present.  Lower portion of the wound has an Eakin's pouch in place with succus present.  She has a left sided colostomy present with no output currently. no masses, hernias, or organomegaly MS: all 4 extremities are symmetrical with extensive edema Skin: warm and dry with no masses, lesions, or rashes Psych: unable to assess secondary to on vent    Results for orders placed during the hospital encounter of 08/17/2013 (from the past 48 hour(s))  GLUCOSE, CAPILLARY     Status: Abnormal   Collection Time    08/22/2013 11:23 AM      Result Value Ref Range   Glucose-Capillary 142 (*) 70 - 99 mg/dL  MRSA PCR SCREENING     Status: None   Collection Time    08/15/2013 11:24 AM      Result Value Ref Range   MRSA by PCR NEGATIVE  NEGATIVE   Comment:            The GeneXpert MRSA Assay (FDA     approved  for NASAL specimens     only), is one component of a     comprehensive MRSA colonization     surveillance program. It is not     intended to diagnose MRSA     infection nor to guide or     monitor  treatment for     MRSA infections.  URINALYSIS, ROUTINE W REFLEX MICROSCOPIC     Status: Abnormal   Collection Time    08/29/2013 11:26 AM      Result Value Ref Range   Color, Urine BROWN (*) YELLOW   Comment: BIOCHEMICALS MAY BE AFFECTED BY COLOR   APPearance TURBID (*) CLEAR   Specific Gravity, Urine 1.020  1.005 - 1.030   pH 7.0  5.0 - 8.0   Glucose, UA 100 (*) NEGATIVE mg/dL   Hgb urine dipstick LARGE (*) NEGATIVE   Bilirubin Urine LARGE (*) NEGATIVE   Ketones, ur 15 (*) NEGATIVE mg/dL   Protein, ur >300 (*) NEGATIVE mg/dL   Urobilinogen, UA >8.0 (*) 0.0 - 1.0 mg/dL   Nitrite POSITIVE (*) NEGATIVE   Leukocytes, UA LARGE (*) NEGATIVE  URINE MICROSCOPIC-ADD ON     Status: Abnormal   Collection Time    08/10/2013 11:26 AM      Result Value Ref Range   Squamous Epithelial / LPF FEW (*) RARE   WBC, UA 7-10  <3 WBC/hpf   RBC / HPF 7-10  <3 RBC/hpf   Bacteria, UA MANY (*) RARE   Casts GRANULAR CAST (*) NEGATIVE   Urine-Other RARE YEAST     Comment: LESS THAN 10 mL OF URINE SUBMITTED     MICROSCOPIC EXAM PERFORMED ON UNCONCENTRATED URINE  COMPREHENSIVE METABOLIC PANEL     Status: Abnormal   Collection Time    08/04/2013 12:00 PM      Result Value Ref Range   Sodium 136 (*) 137 - 147 mEq/L   Potassium 5.7 (*) 3.7 - 5.3 mEq/L   Chloride 100  96 - 112 mEq/L   CO2 19  19 - 32 mEq/L   Glucose, Bld 116 (*) 70 - 99 mg/dL   BUN 87 (*) 6 - 23 mg/dL   Creatinine, Ser 2.31 (*) 0.50 - 1.10 mg/dL   Calcium 7.9 (*) 8.4 - 10.5 mg/dL   Total Protein 4.6 (*) 6.0 - 8.3 g/dL   Albumin 2.4 (*) 3.5 - 5.2 g/dL   AST 446 (*) 0 - 37 U/L   ALT 38 (*) 0 - 35 U/L   Alkaline Phosphatase 102  39 - 117 U/L   Total Bilirubin 1.6 (*) 0.3 - 1.2 mg/dL   GFR calc non Af Amer 20 (*) >90 mL/min   GFR calc Af Amer  23 (*) >90 mL/min   Comment: (NOTE)     The eGFR has been calculated using the CKD EPI equation.     This calculation has not been validated in all clinical situations.     eGFR's persistently <90 mL/min signify possible Chronic Kidney     Disease.  CBC     Status: Abnormal   Collection Time    08/12/2013 12:00 PM      Result Value Ref Range   WBC 34.0 (*) 4.0 - 10.5 K/uL   Comment: REPEATED TO VERIFY   RBC 3.06 (*) 3.87 - 5.11 MIL/uL   Hemoglobin 9.3 (*) 12.0 - 15.0 g/dL   HCT 26.3 (*) 36.0 - 46.0 %   MCV 85.9  78.0 - 100.0 fL   MCH 30.4  26.0 - 34.0 pg   MCHC 35.4  30.0 - 36.0 g/dL   RDW 17.6 (*) 11.5 - 15.5 %   Platelets 206  150 - 400 K/uL  GLUCOSE, CAPILLARY     Status: Abnormal   Collection Time    08/16/2013 12:00  PM      Result Value Ref Range   Glucose-Capillary 126 (*) 70 - 99 mg/dL  POCT I-STAT 3, BLOOD GAS (G3+)     Status: Abnormal   Collection Time    08/09/2013  1:14 PM      Result Value Ref Range   pH, Arterial 7.315 (*) 7.350 - 7.450   pCO2 arterial 37.3  35.0 - 45.0 mmHg   pO2, Arterial 67.0 (*) 80.0 - 100.0 mmHg   Bicarbonate 19.0 (*) 20.0 - 24.0 mEq/L   TCO2 20  0 - 100 mmol/L   O2 Saturation 92.0     Acid-base deficit 7.0 (*) 0.0 - 2.0 mmol/L   Patient temperature 97.9 F     Collection site RADIAL, ALLEN'S TEST ACCEPTABLE     Drawn by Operator     Sample type ARTERIAL     Dg Chest Port 1 View  08/12/2013   CLINICAL DATA:  73 year old female respiratory failure. Initial encounter.  EXAM: PORTABLE CHEST - 1 VIEW  COMPARISON:  0510 hr the same day and earlier.  FINDINGS: Portable AP upright view at 1211 hrs. Endotracheal tube tip in good position between the clavicles and carina. Stable enteric tube coursing to the left upper quadrant. Stable right IJ central line. Sequelae of CABG again noted. Increased EKG leads and wires overlying the chest.  Stable cardiac size and mediastinal contours. Stable to mildly regressed confluent and patchy bilateral perihilar  opacity. No pneumothorax. No large effusion.  IMPRESSION: 1.  Stable lines and tubes. 2. No significant change in ventilation with confluent bilateral perihilar opacity, with Main differential considerations of bilateral pneumonia/aspiration and asymmetric pulmonary edema.   Electronically Signed   By: Lars Pinks M.D.   On: 08/29/2013 12:34       Assessment/Plan 1. S/p ex lap with SBR, LOA, and takedown of rectoenteric fistula 2. EC fistula 3. VDRF 4. ARF 5. S/p cardiopulmonary arrest 6. PCM/TNA  Plan: 1. We will need to remove her staples and plan on removing her Eakin's pouch tomorrow to better be able to examine and address this problem.  For now, dry dressings to her midline wound.  Cont routine ostomy care. 2. She will need to remain NPO on TNA until her fistula heals.  Recommend restarting this once she has access. 3. Will need to look at CT scan from yesterday with our radiologist to go over this and determine if anything else needs to be acutely done. 4. Cont abx coverage for intra-abdominal processes at this time. 5. We will follow with you.  Verbie Babic E 08/17/2013, 4:18 PM Pager: [phone]

## 2013-08-08 ENCOUNTER — Inpatient Hospital Stay (HOSPITAL_COMMUNITY): Payer: Medicare Other

## 2013-08-08 DIAGNOSIS — I059 Rheumatic mitral valve disease, unspecified: Secondary | ICD-10-CM

## 2013-08-08 LAB — CBC
HCT: 26.7 % — ABNORMAL LOW (ref 36.0–46.0)
HEMOGLOBIN: 9 g/dL — AB (ref 12.0–15.0)
MCH: 29.3 pg (ref 26.0–34.0)
MCHC: 33.7 g/dL (ref 30.0–36.0)
MCV: 87 fL (ref 78.0–100.0)
PLATELETS: 208 10*3/uL (ref 150–400)
RBC: 3.07 MIL/uL — ABNORMAL LOW (ref 3.87–5.11)
RDW: 17.7 % — ABNORMAL HIGH (ref 11.5–15.5)
WBC: 35.9 10*3/uL — ABNORMAL HIGH (ref 4.0–10.5)

## 2013-08-08 LAB — DIFFERENTIAL
Basophils Absolute: 0 10*3/uL (ref 0.0–0.1)
Basophils Relative: 0 % (ref 0–1)
EOS ABS: 0 10*3/uL (ref 0.0–0.7)
Eosinophils Relative: 0 % (ref 0–5)
LYMPHS ABS: 1.1 10*3/uL (ref 0.7–4.0)
Lymphocytes Relative: 3 % — ABNORMAL LOW (ref 12–46)
MONO ABS: 1.4 10*3/uL — AB (ref 0.1–1.0)
MONOS PCT: 4 % (ref 3–12)
Neutro Abs: 33.4 10*3/uL — ABNORMAL HIGH (ref 1.7–7.7)
Neutrophils Relative %: 93 % — ABNORMAL HIGH (ref 43–77)

## 2013-08-08 LAB — COMPREHENSIVE METABOLIC PANEL WITH GFR
ALT: 34 U/L (ref 0–35)
AST: 555 U/L — ABNORMAL HIGH (ref 0–37)
Albumin: 2.2 g/dL — ABNORMAL LOW (ref 3.5–5.2)
Alkaline Phosphatase: 101 U/L (ref 39–117)
BUN: 91 mg/dL — ABNORMAL HIGH (ref 6–23)
CO2: 19 meq/L (ref 19–32)
Calcium: 8.2 mg/dL — ABNORMAL LOW (ref 8.4–10.5)
Chloride: 99 meq/L (ref 96–112)
Creatinine, Ser: 2.45 mg/dL — ABNORMAL HIGH (ref 0.50–1.10)
GFR calc Af Amer: 22 mL/min — ABNORMAL LOW
GFR calc non Af Amer: 19 mL/min — ABNORMAL LOW
Glucose, Bld: 88 mg/dL (ref 70–99)
Potassium: 5.8 meq/L — ABNORMAL HIGH (ref 3.7–5.3)
Sodium: 134 meq/L — ABNORMAL LOW (ref 137–147)
Total Bilirubin: 1.7 mg/dL — ABNORMAL HIGH (ref 0.3–1.2)
Total Protein: 4.7 g/dL — ABNORMAL LOW (ref 6.0–8.3)

## 2013-08-08 LAB — URINE CULTURE
Colony Count: >=100000
Special Requests: NORMAL

## 2013-08-08 LAB — PREALBUMIN: Prealbumin: 15.6 mg/dL — ABNORMAL LOW (ref 17.0–34.0)

## 2013-08-08 LAB — BASIC METABOLIC PANEL
BUN: 93 mg/dL — ABNORMAL HIGH (ref 6–23)
CO2: 18 mEq/L — ABNORMAL LOW (ref 19–32)
CREATININE: 2.6 mg/dL — AB (ref 0.50–1.10)
Calcium: 8 mg/dL — ABNORMAL LOW (ref 8.4–10.5)
Chloride: 99 mEq/L (ref 96–112)
GFR, EST AFRICAN AMERICAN: 20 mL/min — AB (ref >90)
GFR, EST NON AFRICAN AMERICAN: 17 mL/min — AB (ref >90)
Glucose, Bld: 73 mg/dL (ref 70–99)
Potassium: 5.8 mEq/L — ABNORMAL HIGH (ref 3.7–5.3)
Sodium: 133 mEq/L — ABNORMAL LOW (ref 137–147)

## 2013-08-08 LAB — MAGNESIUM: Magnesium: 2.1 mg/dL (ref 1.5–2.5)

## 2013-08-08 LAB — GLUCOSE, CAPILLARY
GLUCOSE-CAPILLARY: 70 mg/dL (ref 70–99)
GLUCOSE-CAPILLARY: 54 mg/dL — AB (ref 70–99)
GLUCOSE-CAPILLARY: 87 mg/dL (ref 70–99)
Glucose-Capillary: 85 mg/dL (ref 70–99)
Glucose-Capillary: 93 mg/dL (ref 70–99)
Glucose-Capillary: 83 mg/dL (ref 70–99)
Glucose-Capillary: 72 mg/dL (ref 70–99)

## 2013-08-08 LAB — POCT ACTIVATED CLOTTING TIME
ACTIVATED CLOTTING TIME: 193 s
ACTIVATED CLOTTING TIME: 193 s
Activated Clotting Time: 199 seconds
Activated Clotting Time: 204 seconds
Activated Clotting Time: 210 seconds

## 2013-08-08 LAB — PHOSPHORUS: Phosphorus: 7.2 mg/dL — ABNORMAL HIGH (ref 2.3–4.6)

## 2013-08-08 LAB — TRIGLYCERIDES: Triglycerides: 91 mg/dL (ref <150)

## 2013-08-08 MED ORDER — PRISMASOL BGK 0/2.5 32-2.5 MEQ/L IV SOLN
INTRAVENOUS | Status: DC
  Administered 2013-08-08 – 2013-08-11 (×4): via INTRAVENOUS_CENTRAL
  Filled 2013-08-08 (×5): qty 5000

## 2013-08-08 MED ORDER — INSULIN ASPART 100 UNIT/ML ~~LOC~~ SOLN
0.0000 [IU] | Freq: Four times a day (QID) | SUBCUTANEOUS | Status: DC
  Administered 2013-08-09: 1 [IU] via SUBCUTANEOUS

## 2013-08-08 MED ORDER — HEPARIN SODIUM (PORCINE) 1000 UNIT/ML DIALYSIS
1000.0000 [IU] | INTRAMUSCULAR | Status: DC | PRN
  Administered 2013-08-08: 4000 [IU] via INTRAVENOUS_CENTRAL
  Administered 2013-08-21: 5000 [IU] via INTRAVENOUS_CENTRAL
  Filled 2013-08-08: qty 2
  Filled 2013-08-08: qty 6
  Filled 2013-08-08: qty 1
  Filled 2013-08-08: qty 2
  Filled 2013-08-08: qty 3

## 2013-08-08 MED ORDER — ALBUTEROL SULFATE (2.5 MG/3ML) 0.083% IN NEBU: 2.5000 mg | INHALATION_SOLUTION | RESPIRATORY_TRACT | Status: DC | PRN

## 2013-08-08 MED ORDER — M.V.I. ADULT IV INJ
INTRAVENOUS | Status: AC
  Administered 2013-08-08: 18:00:00 via INTRAVENOUS
  Filled 2013-08-08: qty 1000

## 2013-08-08 MED ORDER — PRISMASOL BGK 0/2.5 32-2.5 MEQ/L IV SOLN
INTRAVENOUS | Status: DC
  Administered 2013-08-08 – 2013-08-09 (×4): via INTRAVENOUS_CENTRAL
  Filled 2013-08-08 (×10): qty 5000

## 2013-08-08 MED ORDER — FAT EMULSION 20 % IV EMUL
250.0000 mL | INTRAVENOUS | Status: AC
  Administered 2013-08-08: 250 mL via INTRAVENOUS
  Filled 2013-08-08: qty 250

## 2013-08-08 MED ORDER — DEXTROSE 5 % IV SOLN
2.0000 ug/min | INTRAVENOUS | Status: DC
  Administered 2013-08-08 (×2): 10 ug/min via INTRAVENOUS
  Administered 2013-08-08: 30 ug/min via INTRAVENOUS
  Administered 2013-08-08: 20 ug/min via INTRAVENOUS
  Administered 2013-08-09: 10 ug/min via INTRAVENOUS
  Administered 2013-08-09: 6 ug/min via INTRAVENOUS
  Administered 2013-08-10: 9 ug/min via INTRAVENOUS
  Administered 2013-08-10: 4 ug/min via INTRAVENOUS
  Administered 2013-08-11: 14 ug/min via INTRAVENOUS
  Administered 2013-08-11: 12 ug/min via INTRAVENOUS
  Administered 2013-08-11: 7.013 ug/min via INTRAVENOUS
  Administered 2013-08-12: 11.493 ug/min via INTRAVENOUS
  Administered 2013-08-12 (×2): 12 ug/min via INTRAVENOUS
  Administered 2013-08-13: 15 ug/min via INTRAVENOUS
  Administered 2013-08-13: 12 ug/min via INTRAVENOUS
  Filled 2013-08-08 (×15): qty 4

## 2013-08-08 MED ORDER — SODIUM CHLORIDE 0.9 % FOR CRRT
INTRAVENOUS_CENTRAL | Status: DC | PRN
  Administered 2013-08-22: 13:00:00 via INTRAVENOUS_CENTRAL
  Filled 2013-08-08: qty 1000

## 2013-08-08 MED ORDER — PHENYLEPHRINE HCL 10 MG/ML IJ SOLN
30.0000 ug/min | INTRAVENOUS | Status: DC
  Filled 2013-08-08: qty 1

## 2013-08-08 MED ORDER — SODIUM CHLORIDE 0.9 % IV SOLN
100.0000 mg | Freq: Every day | INTRAVENOUS | Status: DC
  Filled 2013-08-08: qty 100

## 2013-08-08 MED ORDER — HEPARIN BOLUS VIA INFUSION (CRRT)
1000.0000 [IU] | INTRAVENOUS | Status: DC | PRN
  Filled 2013-08-08: qty 1000

## 2013-08-08 MED ORDER — HEPARIN SODIUM (PORCINE) 5000 UNIT/ML IJ SOLN
250.0000 [IU]/h | INTRAMUSCULAR | Status: DC
  Administered 2013-08-08: 0.5 [IU]/h via INTRAVENOUS_CENTRAL
  Administered 2013-08-10: 250 [IU]/h via INTRAVENOUS_CENTRAL
  Filled 2013-08-08 (×3): qty 2

## 2013-08-08 NOTE — Procedures (Signed)
HD Catheter Insertion Procedure Note Deveron FurlongSandra K Paras [id number] 05/27/1941  Procedure: Insertion of HD Catheter Indications: CVVH  Procedure Details Consent: Risks of procedure as well as the alternatives and risks of each were explained to the (patient/caregiver).  Consent for procedure obtained. Time Out: Verified patient identification, verified procedure, site/side was marked, verified correct patient position, special equipment/implants available, medications/allergies/relevent history reviewed, required imaging and test results available.  Performed  Maximum sterile technique was used including antiseptics, cap, gloves, gown, hand hygiene, mask and sheet. Skin prep: Chlorhexidine; local anesthetic administered A antimicrobial bonded/coated triple lumen catheter was placed in the right internal jugular vein using the Seldinger technique.  Evaluation Blood flow good Complications: No apparent complications Patient did tolerate procedure well. Chest X-ray ordered to verify placement.  CXR: pending.  Procedure performed under direct US guidance and guidewire was visualized in the vessel.   Rutherford Guysahul Desai, PA - [facility] Pgr: [phone]  or [phone]  Supervised and presented through entire procedure.  Lonia FarberZUBELEVITSKIY, Rihana Kiddy, MD Pulmonary and Critical Care Medicine [facility] Pager: [phone]

## 2013-08-08 NOTE — Consult Note (Addendum)
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  Pt is not awake and on vent, no family in room for pouch change.  CCS in room to assess wound and ostomy sites and discuss plan of care. Stomal assessment/size: Stoma red and viable, above skin level, appears edematous, approx 2 inches.    Mod amt yellow drainage in pouch (approx 50cc) no stool or flatus. Peristomal assessment: Leaking mod amt yellow drainage around edges of stoma R/T generalized anasarca but skin intact surrounding stoma. Ostomy pouching: 1pc. Applied one piece pouch with barrier ring to maintain seal.  Education provided: Will follow for educational sessions when stable and out of ICU.  WOC wound consult note Reason for Consult: Midline abd with staples intact and well approximated.  Mod amt yellow drainage from between staples R/T anasarca. Dry 4X4 and tape applied to site. 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 Dressing procedure/placement/frequency: Difficult to maintain a pouch seal R/T constant moisture on skin to abd.  Applied Eakin pouch to control drainage; this may be attached to drainage bag if large amt occurs.  Emptied approx 100cc from previous drainage bag. Applied pink tape in a picture-frame fashion to help maintain seal. Supplies ordered to bedside for staff use. Leah Mcgeeawn Leobardo Granlund MSN, RN, CWOCN, ChadronWCN-AP, CNS [phone]

## 2013-08-08 NOTE — Progress Notes (Signed)
eLink Physician-Brief Progress Note Patient Name: Leah FurlongSandra K [name] DOB: 02/20/1941 MRN: [id number]  Date of Service  08/08/2013   HPI/Events of Note   Tachy with levophed.  eICU Interventions  Add neo and hold levophed.   Intervention Category Major Interventions: Hypotension - evaluation and management  [name] 08/08/2013, 6:49 PM

## 2013-08-08 NOTE — Progress Notes (Signed)
PULMONARY / CRITICAL CARE MEDICINE   Name: Leah [name] MRN: [id number] DOB: [birth year]    ADMISSION DATE:  09/03/2013  REFERRING MD :  [facility]  PRIMARY SERVICE: PCCM  CHIEF COMPLAINT:  Respiratory Failure   BRIEF PATIENT DESCRIPTION: 73 y/o admitted to [facility] for planned repair of enterocolonic fistula.  Course was complicated by ileus, renal failure, cardiopulmonary arrest.  Transferred to [facility] on 3/4 for further care.   SIGNIFICANT EVENTS / STUDIES:  2/13  OR >>> Ex-lap with small bowel resection, extensive lysis of adhesions, vomiting 2/22  Cardiac arrest 3/5    TTE >>> 3/5    CVVHD started  LINES / TUBES: OETT 2/22 >>> R IJ TLC 2/14 >>> 3/4 L IJ TLC 3/4 >>> Foley ??? >>> NGT ??? >>>  CULTURES: ???[facility]) >>> ENTEROCOCCUS FAECALIS 3/4 Blood >>> 3/4 Urine >>> 3/4 Sputum >>>  ANTIBIOTICS: Vanco 3/4 >>> Imipenem 3/4 >>>  INTERVAL HISTORY:  Seen by Renal.  HD cath placement requested.  VITAL SIGNS: Temp:  [96 F (35.6 C)-97.9 F (36.6 C)] 97.5 F (36.4 C) (03/05 0430) Pulse Rate:  [59-112] 83 (03/05 0800) Resp:  [0-28] 14 (03/05 0800) BP: (82-149)/(40-106) 104/61 mmHg (03/05 0800) SpO2:  [92 %-100 %] 100 % (03/05 0800) FiO2 (%):  [30 %] 30 % (03/05 0800) Weight:  [86 kg (189 lb 9.5 oz)-86.6 kg (190 lb 14.7 oz)] 86.6 kg (190 lb 14.7 oz) (03/05 0622)  HEMODYNAMICS:   VENTILATOR SETTINGS: Vent Mode:  [-] PSV;CPAP FiO2 (%):  [30 %] 30 % Set Rate:  [26 bmp] 26 bmp Vt Set:  [380 mL] 380 mL PEEP:  [5 cmH20] 5 cmH20 Pressure Support:  [15 cmH20] 15 cmH20 Plateau Pressure:  [28 cmH20-32 cmH20] 30 cmH20  INTAKE / OUTPUT: Intake/Output     03/04 0701 - 03/05 0700 03/05 0701 - 03/06 0700   I.V. (mL/kg) 303.7 (3.5)    NG/GT 60    IV Piggyback 798    Total Intake(mL/kg) 1161.7 (13.4)    Urine (mL/kg/hr) 106    Emesis/NG output 150    Other 1225    Stool 275    Total Output 1756     Net -594.3           PHYSICAL  EXAMINATION: General:  Mechanically ventilated, synchronous Neuro:  Sedated, gag / cough diminished HEENT:  OETT / NGT Cardiovascular:  Regular, no murmurs Lungs:  Bilateral air entry, no added sounds Abdomen:  LLQ colostomy, RLQ fistula, surgical dressing c/d/i Musculoskeletal:  Moves all extremities Skin:  Anasarca  LABS:  CBC  Recent Labs Lab 08/12/2013 1200 08/08/13 0405  WBC 34.0* 35.9*  HGB 9.3* 9.0*  HCT 26.3* 26.7*  PLT 206 208   Coag's No results found for this basename: APTT, INR,  in the last 168 hours  BMET  Recent Labs Lab 08/05/2013 1200 08/26/2013 1648 08/08/13 0405  NA 136* 135* 134*  K 5.7* 5.7* 5.8*  CL 100 100 99  CO2 19 19 19   BUN 87* 85* 91*  CREATININE 2.31* 2.29* 2.45*  GLUCOSE 116* 111* 88   Electrolytes  Recent Labs Lab 08/30/2013 1200 08/11/2013 1648 08/08/13 0405  CALCIUM 7.9* 7.9* 8.2*  MG  --   --  2.1  PHOS  --   --  7.2*   Sepsis Markers No results found for this basename: LATICACIDVEN, PROCALCITON, O2SATVEN,  in the last 168 hours  ABG  Recent Labs Lab 08/28/2013 1314  PHART 7.315*  PCO2ART 37.3  PO2ART 67.0*   Liver Enzymes  Recent Labs Lab 08/26/2013 1200 08/08/13 0405  AST 446* 555*  ALT 38* 34  ALKPHOS 102 101  BILITOT 1.6* 1.7*  ALBUMIN 2.4* 2.2*   Cardiac Enzymes No results found for this basename: TROPONINI, PROBNP,  in the last 168 hours  Glucose  Recent Labs Lab 08/17/2013 1200 08/15/2013 1635 08/16/2013 1900 08/29/2013 2348 08/08/13 0433 08/08/13 0811  GLUCAP 126* 106* 114* 93 85 83   IMAGING:  Dg Chest Port 1 View  08/08/2013   CLINICAL DATA:  Check endotracheal tube position  EXAM: PORTABLE CHEST - 1 VIEW  COMPARISON:  08/21/2013  FINDINGS: The endotracheal tube is again identified 5.2 cm above the carina. A right-sided central venous line has been removed in the interval. A left jugular line remains stable with the tip in the proximal right atrium. A nasogastric catheter is noted proximally. Its distal  aspect is not well visualized on this exam. Diffuse bilateral airspace disease and vascular congestion is noted. Postsurgical changes are seen.  IMPRESSION: Increasing vascular congestion superimposed over the bilateral infiltrates.  Tubes and lines as described.   Electronically Signed   By: Alcide CleverMark  Lukens M.D.   On: 08/08/2013 08:08   Dg Chest Portable 1 View  08/22/2013   CLINICAL DATA:  Central line placement.  EXAM: PORTABLE CHEST - 1 VIEW  COMPARISON:  Single view of the chest 08/30/2013 at 05/2010.  FINDINGS: New left IJ catheter is in place with the tip projecting over the right atrium. Right IJ catheter with the tip projecting in the mid superior vena cava is unchanged. Endotracheal tube and NG tube are again noted.  Diffuse bilateral airspace disease persists. No pneumothorax. Cardiomegaly noted. Recommend withdrawal of 3.5 x 4 cm.  IMPRESSION: New left IJ catheter tip projects in the right atrium. The line should be withdrawn 3.5-4 cm. Support apparatus is otherwise unchanged. No pneumothorax.  No change in diffuse bilateral airspace disease.   Electronically Signed   By: Drusilla Kannerhomas  Dalessio M.D.   On: 08/16/2013 17:47   Dg Chest Port 1 View  08/15/2013   CLINICAL DATA:  73 year old female respiratory failure. Initial encounter.  EXAM: PORTABLE CHEST - 1 VIEW  COMPARISON:  0510 hr the same day and earlier.  FINDINGS: Portable AP upright view at 1211 hrs. Endotracheal tube tip in good position between the clavicles and carina. Stable enteric tube coursing to the left upper quadrant. Stable right IJ central line. Sequelae of CABG again noted. Increased EKG leads and wires overlying the chest.  Stable cardiac size and mediastinal contours. Stable to mildly regressed confluent and patchy bilateral perihilar opacity. No pneumothorax. No large effusion.  IMPRESSION: 1.  Stable lines and tubes. 2. No significant change in ventilation with confluent bilateral perihilar opacity, with Main differential  considerations of bilateral pneumonia/aspiration and asymmetric pulmonary edema.   Electronically Signed   By: Augusto GambleLee  Hall M.D.   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  CRITICAL CARE:  The patient is critically ill with multiple organ systems failure and requires high complexity decision making for assessment and support, frequent evaluation and titration of therapies, application of advanced monitoring technologies and extensive interpretation of multiple databases. Critical Care Time devoted to patient care services described in this note is 35 minutes.   Lonia Farber, MD Pulmonary and Critical Care Medicine [facility] Pager: [phone]  08/08/2013, 10:02 AM

## 2013-08-08 NOTE — Progress Notes (Signed)
Aline insertion attempted unsuccessfully by 2 RT's per protocol. No complications noted. MD and RN aware.

## 2013-08-08 NOTE — Progress Notes (Signed)
Patient  examined, agree with PA note above. Will review outside CT today  Mariella SaaBenjamin T Delbert Darley MD, FACS  08/08/2013 10:16 AM

## 2013-08-08 NOTE — Progress Notes (Signed)
Patient ID: Deveron FurlongSandra K Goodley, female   DOB: 03/01/1941, 73 y.o.   MRN: [id number]    Subjective: Pt on vent.  Still with diffuse anasarca  Objective: Vital signs in last 24 hours: Temp:  [96 F (35.6 C)-97.9 F (36.6 C)] 97.5 F (36.4 C) (03/05 0430) Pulse Rate:  [59-112] 83 (03/05 0800) Resp:  [0-28] 14 (03/05 0800) BP: (82-149)/(40-106) 104/61 mmHg (03/05 0800) SpO2:  [92 %-100 %] 100 % (03/05 0800) FiO2 (%):  [30 %] 30 % (03/05 0800) Weight:  [189 lb 9.5 oz (86 kg)-190 lb 14.7 oz (86.6 kg)] 190 lb 14.7 oz (86.6 kg) (03/05 0622)    Intake/Output from previous day: 03/04 0701 - 03/05 0700 In: 1161.7 [I.V.:303.7; NG/GT:60; IV Piggyback:798] Out: 1756 [Urine:106; Emesis/NG output:150; Stool:275] Intake/Output this shift:    PE: Abd: diffuse edema, abdominal wall hard secondary to edema.  Eakin's pouch removed.  Suspect this drainage is actually secondary to weeping from anasarca instead of an EC fistula.  Due to this drainage and extensive edema, we will leave staples for now.  Ostomy is pink and viable, no significant output  Lab Results:   Recent Labs  01/12/14 1200 08/08/13 0405  WBC 34.0* 35.9*  HGB 9.3* 9.0*  HCT 26.3* 26.7*  PLT 206 208   BMET  Recent Labs  01/12/14 1648 08/08/13 0405  NA 135* 134*  K 5.7* 5.8*  CL 100 99  CO2 19 19  GLUCOSE 111* 88  BUN 85* 91*  CREATININE 2.29* 2.45*  CALCIUM 7.9* 8.2*   PT/INR No results found for this basename: LABPROT, INR,  in the last 72 hours CMP     Component Value Date/Time   NA 134* 08/08/2013 0405   K 5.8* 08/08/2013 0405   CL 99 08/08/2013 0405   CO2 19 08/08/2013 0405   GLUCOSE 88 08/08/2013 0405   BUN 91* 08/08/2013 0405   CREATININE 2.45* 08/08/2013 0405   CALCIUM 8.2* 08/08/2013 0405   PROT 4.7* 08/08/2013 0405   ALBUMIN 2.2* 08/08/2013 0405   AST 555* 08/08/2013 0405   ALT 34 08/08/2013 0405   ALKPHOS 101 08/08/2013 0405   BILITOT 1.7* 08/08/2013 0405   GFRNONAA 19* 08/08/2013 0405   GFRAA 22* 08/08/2013 0405   Lipase   No results found for this basename: lipase       Studies/Results: Dg Chest Port 1 View  08/08/2013   CLINICAL DATA:  Check endotracheal tube position  EXAM: PORTABLE CHEST - 1 VIEW  COMPARISON:  12/31/2013  FINDINGS: The endotracheal tube is again identified 5.2 cm above the carina. A right-sided central venous line has been removed in the interval. A left jugular line remains stable with the tip in the proximal right atrium. A nasogastric catheter is noted proximally. Its distal aspect is not well visualized on this exam. Diffuse bilateral airspace disease and vascular congestion is noted. Postsurgical changes are seen.  IMPRESSION: Increasing vascular congestion superimposed over the bilateral infiltrates.  Tubes and lines as described.   Electronically Signed   By: Alcide CleverMark  Lukens M.D.   On: 08/08/2013 08:08   Dg Chest Portable 1 View  08/11/2013   CLINICAL DATA:  Central line placement.  EXAM: PORTABLE CHEST - 1 VIEW  COMPARISON:  Single view of the chest 12/31/2013 at 05/2010.  FINDINGS: New left IJ catheter is in place with the tip projecting over the right atrium. Right IJ catheter with the tip projecting in the mid superior vena cava is unchanged. Endotracheal tube and NG  tube are again noted.  Diffuse bilateral airspace disease persists. No pneumothorax. Cardiomegaly noted. Recommend withdrawal of 3.5 x 4 cm.  IMPRESSION: New left IJ catheter tip projects in the right atrium. The line should be withdrawn 3.5-4 cm. Support apparatus is otherwise unchanged. No pneumothorax.  No change in diffuse bilateral airspace disease.   Electronically Signed   By: Drusilla Kanner M.D.   On: 08/14/2013 17:47   Dg Chest Port 1 View  08/19/2013   CLINICAL DATA:  73 year old female respiratory failure. Initial encounter.  EXAM: PORTABLE CHEST - 1 VIEW  COMPARISON:  0510 hr the same day and earlier.  FINDINGS: Portable AP upright view at 1211 hrs. Endotracheal tube tip in good position between the clavicles and  carina. Stable enteric tube coursing to the left upper quadrant. Stable right IJ central line. Sequelae of CABG again noted. Increased EKG leads and wires overlying the chest.  Stable cardiac size and mediastinal contours. Stable to mildly regressed confluent and patchy bilateral perihilar opacity. No pneumothorax. No large effusion.  IMPRESSION: 1.  Stable lines and tubes. 2. No significant change in ventilation with confluent bilateral perihilar opacity, with Main differential considerations of bilateral pneumonia/aspiration and asymmetric pulmonary edema.   Electronically Signed   By: Augusto Gamble M.D.   On: 08/25/2013 12:34    Anti-infectives: Anti-infectives   Start     Dose/Rate Route Frequency Ordered Stop   08/26/2013 1800  vancomycin (VANCOCIN) IVPB 1000 mg/200 mL premix     1,000 mg 200 mL/hr over 60 Minutes Intravenous Every 24 hours 08/15/2013 1625     08/21/2013 1700  imipenem-cilastatin (PRIMAXIN) 250 mg in sodium chloride 0.9 % 100 mL IVPB     250 mg 200 mL/hr over 30 Minutes Intravenous 3 times per day 08/24/2013 1624         Assessment/Plan  1. S/p ex lap with LOA, SBR 2. Severe fluid overload with extensive anasarca 3. ARF 4. PCM/TNA  Plan: 1. Doubt this drainage from midline wound is an EC fistula.  There is no odor to this and it appears to be serous fluid and not succus.  WOC, RN has pouched this back up to help protect her skin.  We will leave the staples for now to give her skin extra support. 2. Cont TNA for now.  We will review her CT scan with radiology to confirm no fistulas or leaks are present.  If there aren't, we MAY try some trickle TFs at some point in the future with no real intention of advancing them for a while as her bowels are likely so edematous they will not work.  This trickle may help stimulate them a little bit.  We will decide on this later.  For now, cont NGT in place.   LOS: 1 day    Nicholous Girgenti E 08/08/2013, 9:50 AM Pager: [phone]

## 2013-08-08 NOTE — Progress Notes (Signed)
PARENTERAL NUTRITION CONSULT NOTE - INITIAL  Pharmacy Consult for TPN Indication: EC fistula  Allergies  Allergen Reactions  . Cephalexin Other (See Comments)    Unknown  . Codeine Nausea And Vomiting    Patient Measurements: Height: 5\' 1"  (154.9 cm) Weight: 190 lb 14.7 oz (86.6 kg) IBW/kg (Calculated) : 47.8 Usual Weight: 55 kg (this was admit weight at [facility] 2/13)  Vital Signs: Temp: 97.5 F (36.4 C) (03/05 1234) Temp src: Oral (03/05 1234) BP: 81/56 mmHg (03/05 1220) Pulse Rate: 70 (03/05 1220) Intake/Output from previous day: 03/04 0701 - 03/05 0700 In: 1161.7 [I.V.:303.7; NG/GT:60; IV Piggyback:798] Out: 1756 [Urine:106; Emesis/NG output:150; Stool:275] Intake/Output from this shift:    Labs:  Recent Labs  08/06/2013 1200 08/08/13 0405  WBC 34.0* 35.9*  HGB 9.3* 9.0*  HCT 26.3* 26.7*  PLT 206 208     Recent Labs  08/15/2013 1200 08/20/2013 1648 08/08/13 0405 08/08/13 0450  NA 136* 135* 134*  --   K 5.7* 5.7* 5.8*  --   CL 100 100 99  --   CO2 19 19 19   --   GLUCOSE 116* 111* 88  --   BUN 87* 85* 91*  --   CREATININE 2.31* 2.29* 2.45*  --   CALCIUM 7.9* 7.9* 8.2*  --   MG  --   --  2.1  --   PHOS  --   --  7.2*  --   PROT 4.6*  --  4.7*  --   ALBUMIN 2.4*  --  2.2*  --   AST 446*  --  555*  --   ALT 38*  --  34  --   ALKPHOS 102  --  101  --   BILITOT 1.6*  --  1.7*  --   PREALBUMIN  --   --  15.6*  --   TRIG  --   --   --  91   Estimated Creatinine Clearance: 21 ml/min (by C-G formula based on Cr of 2.45).    Recent Labs  08/08/13 0433 08/08/13 0811 08/08/13 1154  GLUCAP 85 83 70    Medical History: Past Medical History  Diagnosis Date  . COPD (chronic obstructive pulmonary disease)   . CHF (congestive heart failure)   . 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 Hypertension   . Coronary artery disease   . Hypokalemia   . Acute respiratory failure 08/2013  . Malnutrition 08/2013    OF MODERATE DEGREE  .  Pneumonia 08/2013  . Anemia 08/2013    ACUTE  . Acidosis   . Myocardial infarction     NSTEMI  . Chronic kidney disease     CKD  . Blood dyscrasia 08/2013    LEUKOCYTOSIS  . UTI (lower urinary tract infection)   . Shock 08/2013    RESOLVED  . Anasarca   . GI bleed   . History of hysterectomy   . Hx of CABG   . Hx of appendectomy   . Personal history of surgery to other organs     tonsilectomy  . History of GI bleed   . Polio   . H/O cardiac arrest     Medications:  Prescriptions prior to admission  Medication Sig Dispense Refill  . acetaminophen (TYLENOL) 325 MG tablet Take 650 mg by mouth every 6 (six) hours as needed for fever.      Marland Kitchen acetaminophen (TYLENOL) 650 MG suppository Place 650 mg  rectally every 6 (six) hours as needed for fever.      . Albuterol Sulfate (PROVENTIL HFA IN) Inhale 8 puffs into the lungs.      Marland Kitchen atorvastatin (LIPITOR) 80 MG tablet Take 80 mg by mouth daily.      . bethanechol (URECHOLINE) 25 MG tablet Take 25 mg by mouth 3 (three) times daily.      . chlorhexidine (PERIDEX) 0.12 % solution Use as directed 15 mLs in the mouth or throat 2 (two) times daily.      Marland Kitchen FLORA-Q (FLORA-Q) CAPS capsule Take 1 capsule by mouth 2 (two) times daily.      . hydrocortisone sodium succinate (SOLU-CORTEF) 100 mg/2 mL injection Inject 100 mg into the vein every 8 (eight) hours.      Marland Kitchen ipratropium (ATROVENT HFA) 17 MCG/ACT inhaler Inhale 2 puffs into the lungs every 6 (six) hours.      Marland Kitchen ipratropium-albuterol (DUONEB) 0.5-2.5 (3) MG/3ML SOLN Take 3 mLs by nebulization every 2 (two) hours as needed.      . lansoprazole (PREVACID SOLUTAB) 30 MG disintegrating tablet Take 30 mg by mouth daily.      Marland Kitchen losartan (COZAAR) 25 MG tablet Take 25 mg by mouth daily.      . metoprolol (LOPRESSOR) 50 MG tablet Take 50 mg by mouth 2 (two) times daily.      . nefazodone (SERZONE) 200 MG tablet Take 200 mg by mouth 2 (two) times daily.      . nitroGLYCERIN (NITROSTAT) 0.4 MG SL tablet  Place 0.4 mg under the tongue every 5 (five) minutes as needed for chest pain.      . Nutritional Supplements (FEEDING SUPPLEMENT, JEVITY 1.5 CAL,) LIQD Place 1,000 mLs into feeding tube continuous.      Marland Kitchen nystatin ointment (MYCOSTATIN) Apply 1 application topically 2 (two) times daily.      . ranolazine (RANEXA) 500 MG 12 hr tablet Take 500 mg by mouth 2 (two) times daily.      Marland Kitchen senna-docusate (SENOKOT S) 8.6-50 MG per tablet Take 1 tablet by mouth 2 (two) times daily.        Insulin Requirements in the past 24 hours:  None  Current Nutrition:  None - was on Clinimix 5/15 at 49ml/hr at [facility] - placed on hold 3/3 (started 2/27). Was also on TF at various rates during [facility] stay also but didn't tolerate.  Assessment: 73 y.o. female presented 3/4 from [facility]. Admitted 2/13 to [facility] for planned repair of rectoenteric fistula which was completed. Course complicated by ileus, renal fx, cardiopulmonary arrest. Tx to [facility] on 3/4 for further care.Pharmacy consulted to dose TNA.  GI:  Some time over last few weeks, thought to have developed EC fistula. CCS consulted. Surgery doesn't think drainage is EC fistula but plan to review CT scan to confirm no fistulas or leaks present. If none present, may consider TFs at some point but with no real plans to advance for long time due to bowels are likely too edematous to be working. Baseline prealb 15.6 on transfer to [facility] . TG 91.  Endo: No h/o DM. Empiric sensitive SSI q6h when TPN starts. Noted that pt required 10 units insulin in TPN at [facility].  Lytes: K 5.8, Phos 7.2, Na low. Will need TPN without electrolytes.  Renal: h/o CKD. SCr up to 2.45. Pt for CRRT for volume issues. Pt wt up ~70lb since admit with diffuse anasarca. MIVF at [facility].  Pulm: Intubated at [facility]. Fio2  30%  Cards: acute on chronic diast HF, s/p cardio [facility]. BP low-nl, HR 70-108.  Hepatobil: LFTs wnl except for AST up to 555.  Neuro: Fent  gtt  ID: Imipenem and vancomycin for intraabdominal infection and enterococcus UTI, currently afebrile, leukocytosis with WBC 35.9K.  Best Practices: PPI IV, SQ hep  TPN Access: CVC 3/4  TP[facility] day[facility]   Nutritional Goals:  1612 kcal (goal for permissive underfeeding ~1200 kcal); protein 115 gm day  Plan:  1. Start clinimix 5/15 (no electrolytes) at 1140ml/hr and lipids at 5 ml/hr. Will advance to goal as tolerated. Unfortunately with premixed TPN, will not be able to provide very low kcal, high protein diet but will match as closely as we can. Likely will need to be on lipids only twice weekly. 2. Start empiric sensitive SSI q6h when TPN starts 3. Will provide multivitamins daily in TPN 4. Will provide trace elements M/W/F with national shortage 5. Will f/u TPN labs in a.m. 6. Will follow closely with renal MD in regards to max volume of TPN.  Christoper Fabianaron Gayl Ivanoff, PharmD, BCPS Clinical pharmacist, pager [phone] 08/08/2013,12:53 PM

## 2013-08-08 NOTE — Progress Notes (Signed)
INITIAL NUTRITION ASSESSMENT  DOCUMENTATION CODES Per approved criteria  -Obesity Unspecified   INTERVENTION:  TPN dosing per Pharmacy to provide ~1200 kcals (25 kcals/kg ideal weight) and 100% of protein needs as able.  May be unable to meet protein needs without exceeding calorie goals due to premixed Clinimix solution.  NUTRITION DIAGNOSIS: Inadequate oral intake related to inability to eat as evidenced by NPO status.   Goal: Nutrition support to provide 60-70% of estimated calorie needs (22-25 kcals/kg ideal body weight) and 100% of estimated protein needs, based on ASPEN guidelines for permissive underfeeding in critically ill obese individuals.  Monitor:  TF tolerance/adequacy, weight trend, labs, vent status.  Reason for Assessment: MD Consult for new TPN  73 y.o. female  Admitting Dx: Respiratory failure  ASSESSMENT: Patient is a 73 y/o F admitted [facility]13 for planned repair of enterocolonic fistula. S/P exploratory laparotomy with SB resection on 2/13. Course complicated by ileus, renal failure, cardiopulmonary arrest. [facility] for further care.  Discussed patient in ICU rounds today. Starting CRRT and TPN today. Unable to complete nutrition focused physical exam at this time.   Patient is currently intubated on ventilator support.  MV: 10.1 L/min Temp (24hrs), Avg:96.5 F (35.8 C), Min:96 F (35.6 C), Max:97.9 F (36.6 C)   Height: Ht Readings from Last 1 Encounters:  March 04, 2014 5\' 1"  (1.549 m)    Weight: Wt Readings from Last 1 Encounters:  08/08/13 190 lb 14.7 oz (86.6 kg)    Ideal Body Weight: 47.7 kg  % Ideal Body Weight: 182%  Wt Readings from Last 10 Encounters:  08/08/13 190 lb 14.7 oz (86.6 kg)    Usual Body Weight: unknown  % Usual Body Weight: N/A  BMI:  Body mass index is 36.09 kg/(m^2). Obesity, class 2  Estimated Nutritional Needs: Kcal: 1612 Protein: 115 gm Fluid: 1.6-1.8 L  Skin: surgical abdominal  wounds  Diet Order:  NPO  EDUCATION NEEDS: -No education needs identified at this time   Intake/Output Summary (Last 24 hours) at 08/08/13 1125 Last data filed at 08/08/13 0622  Gross per 24 hour  Intake 1131.67 ml  Output   1756 ml  Net -624.33 ml    Last BM: None documented since admission   Labs:   Recent Labs Lab March 04, 2014 1200 March 04, 2014 1648 08/08/13 0405  NA 136* 135* 134*  K 5.7* 5.7* 5.8*  CL 100 100 99  CO2 19 19 19   BUN 87* 85* 91*  CREATININE 2.31* 2.29* 2.45*  CALCIUM 7.9* 7.9* 8.2*  MG  --   --  2.1  PHOS  --   --  7.2*  GLUCOSE 116* 111* 88    CBG (last 3)   Recent Labs  March 04, 2014 2348 08/08/13 0433 08/08/13 0811  GLUCAP 93 85 83    Scheduled Meds: . antiseptic oral rinse  15 mL Mouth Rinse QID  . chlorhexidine  15 mL Mouth Rinse BID  . fentaNYL  50 mcg Intravenous Once  . heparin subcutaneous  5,000 Units Subcutaneous 3 times per day  . imipenem-cilastatin  250 mg Intravenous 3 times per day  . pantoprazole (PROTONIX) IV  40 mg Intravenous Q24H  . pneumococcal 23 valent vaccine  0.5 mL Intramuscular Tomorrow-1000  . vancomycin  1,000 mg Intravenous Q24H    Continuous Infusions: . sodium chloride 10 mL/hr at 08/08/13 0000  . fentaNYL infusion INTRAVENOUS Stopped (March 04, 2014 2300)  . heparin 10,000 units/ 20 mL infusion syringe    .  norepinephrine (LEVOPHED) Adult infusion    . dialysis replacement fluid (prismasate)    . dialysis replacement fluid (prismasate)    . dialysate [facility])      Past Medical History  Diagnosis Date  . COPD (chronic obstructive pulmonary disease)   . CHF (congestive heart failure)   . 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 Hypertension   . Coronary artery disease   . Hypokalemia   . Acute respiratory failure 08/2013  . Malnutrition 08/2013    OF MODERATE DEGREE  . Pneumonia 08/2013  . Anemia 08/2013    ACUTE  . Acidosis   . Myocardial infarction     NSTEMI  . Chronic  kidney disease     CKD  . Blood dyscrasia 08/2013    LEUKOCYTOSIS  . UTI (lower urinary tract infection)   . Shock 08/2013    RESOLVED  . Anasarca   . GI bleed   . History of hysterectomy   . Hx of CABG   . Hx of appendectomy   . Personal history of surgery to other organs     tonsilectomy  . History of GI bleed   . Polio   . H/O cardiac arrest     Past Surgical History  Procedure Laterality Date  . Sigmoid colectomy with colostomy    . Appendectomy  1950  . Coronary artery bypass graft  1998    x4  . Abdominal hysterectomy    . Tonsillectomy    . Exploratory laparotomy with small bowel resection, lysis of ahdesions, and takedown of enterorectal fistula      Joaquin Courts, RD, LDN, CNSC Pager [phone] After Hours Pager [phone]

## 2013-08-08 NOTE — Progress Notes (Signed)
  Echocardiogram 2D Echocardiogram has been performed.  Georgian CoWILLIAMS, Zahra Peffley 08/08/2013, 12:47 PM

## 2013-08-08 NOTE — Progress Notes (Signed)
Assessment:  1 Oliguric AKI, hemodynamically mediated  2 Volume overload, severe  3 Acidosis, metabolic  4 Hyperkalemia  Plan:  CVVHDF  Subjective: Interval History:  Failed diuretic trial  Objective: Vital signs in last 24 hours: Temp:  [96 F (35.6 C)-97.9 F (36.6 C)] 97.5 F (36.4 C) (03/05 0430) Pulse Rate:  [59-112] 83 (03/05 0800) Resp:  [0-28] 14 (03/05 0800) BP: (82-149)/(40-106) 104/61 mmHg (03/05 0800) SpO2:  [92 %-100 %] 100 % (03/05 0800) FiO2 (%):  [30 %] 30 % (03/05 0800) Weight:  [86 kg (189 lb 9.5 oz)-86.6 kg (190 lb 14.7 oz)] 86.6 kg (190 lb 14.7 oz) (03/05 0622) Weight change:   Intake/Output from previous day: 03/04 0701 - 03/05 0700 In: 1161.7 [I.V.:303.7; NG/GT:60; IV Piggyback:798] Out: 1756 [Urine:106; Emesis/NG output:150; Stool:275] Intake/Output this shift:    no change in exam  Lab Results:  Recent Labs  2014/03/28 1200 08/08/13 0405  WBC 34.0* 35.9*  HGB 9.3* 9.0*  HCT 26.3* 26.7*  PLT 206 208   BMET:  Recent Labs  2014/03/28 1648 08/08/13 0405  NA 135* 134*  K 5.7* 5.8*  CL 100 99  CO2 19 19  GLUCOSE 111* 88  BUN 85* 91*  CREATININE 2.29* 2.45*  CALCIUM 7.9* 8.2*   No results found for this basename: PTH,  in the last 72 hours Iron Studies: No results found for this basename: IRON, TIBC, TRANSFERRIN, FERRITIN,  in the last 72 hours Studies/Results: Dg Chest Port 1 View  08/08/2013   CLINICAL DATA:  Check endotracheal tube position  EXAM: PORTABLE CHEST - 1 VIEW  COMPARISON:  31-Mar-2014  FINDINGS: The endotracheal tube is again identified 5.2 cm above the carina. A right-sided central venous line has been removed in the interval. A left jugular line remains stable with the tip in the proximal right atrium. A nasogastric catheter is noted proximally. Its distal aspect is not well visualized on this exam. Diffuse bilateral airspace disease and vascular congestion is noted. Postsurgical changes are seen.  IMPRESSION: Increasing  vascular congestion superimposed over the bilateral infiltrates.  Tubes and lines as described.   Electronically Signed   By: Alcide CleverMark  Lukens M.D.   On: 08/08/2013 08:08   Dg Chest Portable 1 View  08/22/2013   CLINICAL DATA:  Central line placement.  EXAM: PORTABLE CHEST - 1 VIEW  COMPARISON:  Single view of the chest 31-Mar-2014 at 05/2010.  FINDINGS: New left IJ catheter is in place with the tip projecting over the right atrium. Right IJ catheter with the tip projecting in the mid superior vena cava is unchanged. Endotracheal tube and NG tube are again noted.  Diffuse bilateral airspace disease persists. No pneumothorax. Cardiomegaly noted. Recommend withdrawal of 3.5 x 4 cm.  IMPRESSION: New left IJ catheter tip projects in the right atrium. The line should be withdrawn 3.5-4 cm. Support apparatus is otherwise unchanged. No pneumothorax.  No change in diffuse bilateral airspace disease.   Electronically Signed   By: Drusilla Kannerhomas  Dalessio M.D.   On: 31-Mar-2014 17:47   Dg Chest Port 1 View  08/30/2013   CLINICAL DATA:  73 year old female respiratory failure. Initial encounter.  EXAM: PORTABLE CHEST - 1 VIEW  COMPARISON:  0510 hr the same day and earlier.  FINDINGS: Portable AP upright view at 1211 hrs. Endotracheal tube tip in good position between the clavicles and carina. Stable enteric tube coursing to the left upper quadrant. Stable right IJ central line. Sequelae of CABG again noted. Increased EKG leads and  wires overlying the chest.  Stable cardiac size and mediastinal contours. Stable to mildly regressed confluent and patchy bilateral perihilar opacity. No pneumothorax. No large effusion.  IMPRESSION: 1.  Stable lines and tubes. 2. No significant change in ventilation with confluent bilateral perihilar opacity, with Main differential considerations of bilateral pneumonia/aspiration and asymmetric pulmonary edema.   Electronically Signed   By: Augusto Gamble M.D.   On: 08/13/2013 12:34    Scheduled: . antiseptic  oral rinse  15 mL Mouth Rinse QID  . chlorhexidine  15 mL Mouth Rinse BID  . fentaNYL  50 mcg Intravenous Once  . furosemide  160 mg Intravenous 4 times per day  . heparin subcutaneous  5,000 Units Subcutaneous 3 times per day  . imipenem-cilastatin  250 mg Intravenous 3 times per day  . pantoprazole (PROTONIX) IV  40 mg Intravenous Q24H  . pneumococcal 23 valent vaccine  0.5 mL Intramuscular Tomorrow-1000  . vancomycin  1,000 mg Intravenous Q24H    LOS: 1 day   Euel Castile C 08/08/2013,8:55 AM

## 2013-08-09 ENCOUNTER — Inpatient Hospital Stay (HOSPITAL_COMMUNITY): Payer: Medicare Other

## 2013-08-09 LAB — RENAL FUNCTION PANEL
Albumin: 2.1 g/dL — ABNORMAL LOW (ref 3.5–5.2)
Albumin: 2 g/dL — ABNORMAL LOW (ref 3.5–5.2)
BUN: 71 mg/dL — ABNORMAL HIGH (ref 6–23)
BUN: 53 mg/dL — ABNORMAL HIGH (ref 6–23)
CALCIUM: 7.6 mg/dL — AB (ref 8.4–10.5)
CHLORIDE: 99 meq/L (ref 96–112)
CO2: 19 meq/L (ref 19–32)
CO2: 23 meq/L (ref 19–32)
Calcium: 7.8 mg/dL — ABNORMAL LOW (ref 8.4–10.5)
Chloride: 98 mEq/L (ref 96–112)
Creatinine, Ser: 1.96 mg/dL — ABNORMAL HIGH (ref 0.50–1.10)
Creatinine, Ser: 1.46 mg/dL — ABNORMAL HIGH (ref 0.50–1.10)
GFR calc non Af Amer: 25 mL/min — ABNORMAL LOW (ref >90)
GFR, EST AFRICAN AMERICAN: 28 mL/min — AB (ref >90)
GFR, EST AFRICAN AMERICAN: 41 mL/min — AB (ref >90)
GFR, EST NON AFRICAN AMERICAN: 35 mL/min — AB (ref >90)
Glucose, Bld: 112 mg/dL — ABNORMAL HIGH (ref 70–99)
Glucose, Bld: 156 mg/dL — ABNORMAL HIGH (ref 70–99)
POTASSIUM: 4.3 meq/L (ref 3.7–5.3)
Phosphorus: 5.3 mg/dL — ABNORMAL HIGH (ref 2.3–4.6)
Phosphorus: 4.4 mg/dL (ref 2.3–4.6)
Potassium: 3.8 mEq/L (ref 3.7–5.3)
SODIUM: 133 meq/L — AB (ref 137–147)
Sodium: 135 mEq/L — ABNORMAL LOW (ref 137–147)

## 2013-08-09 LAB — POCT ACTIVATED CLOTTING TIME
ACTIVATED CLOTTING TIME: 204 s
ACTIVATED CLOTTING TIME: 204 s
ACTIVATED CLOTTING TIME: 210 s
ACTIVATED CLOTTING TIME: 210 s
ACTIVATED CLOTTING TIME: 215 s
ACTIVATED CLOTTING TIME: 215 s
ACTIVATED CLOTTING TIME: 227 s
Activated Clotting Time: 215 seconds
Activated Clotting Time: 227 seconds

## 2013-08-09 LAB — CORTISOL: Cortisol, Plasma: 27 ug/dL

## 2013-08-09 LAB — GLUCOSE, CAPILLARY
GLUCOSE-CAPILLARY: 117 mg/dL — AB (ref 70–99)
GLUCOSE-CAPILLARY: 130 mg/dL — AB (ref 70–99)
GLUCOSE-CAPILLARY: 141 mg/dL — AB (ref 70–99)
Glucose-Capillary: 124 mg/dL — ABNORMAL HIGH (ref 70–99)
Glucose-Capillary: 127 mg/dL — ABNORMAL HIGH (ref 70–99)
Glucose-Capillary: 178 mg/dL — ABNORMAL HIGH (ref 70–99)

## 2013-08-09 LAB — CBC
HCT: 26.9 % — ABNORMAL LOW (ref 36.0–46.0)
Hemoglobin: 9.1 g/dL — ABNORMAL LOW (ref 12.0–15.0)
MCH: 29.7 pg (ref 26.0–34.0)
MCHC: 33.8 g/dL (ref 30.0–36.0)
MCV: 87.9 fL (ref 78.0–100.0)
Platelets: 193 10*3/uL (ref 150–400)
RBC: 3.06 MIL/uL — AB (ref 3.87–5.11)
RDW: 18.2 % — AB (ref 11.5–15.5)
WBC: 33.3 10*3/uL — AB (ref 4.0–10.5)

## 2013-08-09 LAB — APTT

## 2013-08-09 LAB — MAGNESIUM: Magnesium: 2.2 mg/dL (ref 1.5–2.5)

## 2013-08-09 MED ORDER — SODIUM CHLORIDE 0.9 % IV SOLN
250.0000 mg | Freq: Three times a day (TID) | INTRAVENOUS | Status: DC
  Administered 2013-08-10 – 2013-08-23 (×40): 250 mg via INTRAVENOUS
  Filled 2013-08-09 (×43): qty 250

## 2013-08-09 MED ORDER — TRACE MINERALS CR-CU-F-FE-I-MN-MO-SE-ZN IV SOLN
INTRAVENOUS | Status: AC
  Administered 2013-08-09: 20:00:00 via INTRAVENOUS
  Filled 2013-08-09: qty 2000

## 2013-08-09 MED ORDER — INSULIN ASPART 100 UNIT/ML ~~LOC~~ SOLN
0.0000 [IU] | SUBCUTANEOUS | Status: DC
  Administered 2013-08-09: 3 [IU] via SUBCUTANEOUS
  Administered 2013-08-09 (×2): 2 [IU] via SUBCUTANEOUS
  Administered 2013-08-10 (×2): 3 [IU] via SUBCUTANEOUS
  Administered 2013-08-10: 5 [IU] via SUBCUTANEOUS
  Administered 2013-08-10: 8 [IU] via SUBCUTANEOUS
  Administered 2013-08-10 – 2013-08-11 (×3): 3 [IU] via SUBCUTANEOUS
  Administered 2013-08-11 (×2): 5 [IU] via SUBCUTANEOUS
  Administered 2013-08-11: 3 [IU] via SUBCUTANEOUS
  Administered 2013-08-11 – 2013-08-12 (×7): 5 [IU] via SUBCUTANEOUS
  Administered 2013-08-12: 8 [IU] via SUBCUTANEOUS
  Administered 2013-08-13 (×3): 3 [IU] via SUBCUTANEOUS
  Administered 2013-08-13: 5 [IU] via SUBCUTANEOUS
  Administered 2013-08-13 (×2): 3 [IU] via SUBCUTANEOUS
  Administered 2013-08-14 (×4): 2 [IU] via SUBCUTANEOUS
  Administered 2013-08-14 – 2013-08-15 (×4): 3 [IU] via SUBCUTANEOUS
  Administered 2013-08-15: 2 [IU] via SUBCUTANEOUS
  Administered 2013-08-15 – 2013-08-16 (×5): 3 [IU] via SUBCUTANEOUS
  Administered 2013-08-16: 5 [IU] via SUBCUTANEOUS
  Administered 2013-08-16 – 2013-08-18 (×10): 3 [IU] via SUBCUTANEOUS
  Administered 2013-08-18 (×2): 2 [IU] via SUBCUTANEOUS
  Administered 2013-08-18: 3 [IU] via SUBCUTANEOUS
  Administered 2013-08-18 – 2013-08-19 (×4): 2 [IU] via SUBCUTANEOUS
  Administered 2013-08-20: 0 [IU] via SUBCUTANEOUS
  Administered 2013-08-20: 3 [IU] via SUBCUTANEOUS
  Administered 2013-08-20 – 2013-08-22 (×4): 2 [IU] via SUBCUTANEOUS

## 2013-08-09 MED ORDER — PRISMASOL BGK 4/2.5 32-4-2.5 MEQ/L IV SOLN
INTRAVENOUS | Status: DC
  Administered 2013-08-09 – 2013-08-23 (×65): via INTRAVENOUS_CENTRAL
  Filled 2013-08-09 (×75): qty 5000

## 2013-08-09 MED ORDER — FENTANYL CITRATE 0.05 MG/ML IJ SOLN
50.0000 ug | INTRAMUSCULAR | Status: DC | PRN
  Filled 2013-08-09 (×2): qty 2

## 2013-08-09 MED ORDER — FENTANYL CITRATE 0.05 MG/ML IJ SOLN
50.0000 ug | INTRAMUSCULAR | Status: DC | PRN
  Administered 2013-08-10 – 2013-08-12 (×4): 50 ug via INTRAVENOUS
  Filled 2013-08-09 (×2): qty 2

## 2013-08-09 MED ORDER — IMIPENEM-CILASTATIN 250 MG IV SOLR: 250.0000 mg | Freq: Two times a day (BID) | INTRAVENOUS | Status: DC

## 2013-08-09 MED ORDER — FAT EMULSION 20 % IV EMUL
250.0000 mL | INTRAVENOUS | Status: AC
  Administered 2013-08-09: 250 mL via INTRAVENOUS
  Filled 2013-08-09: qty 250

## 2013-08-09 NOTE — Progress Notes (Signed)
ANTIBIOTIC CONSULT NOTE - FOLLOW UP  Pharmacy Consult for Vancomycin, Imipenem Indication: intraabdominal infection, Enterococcus UTI  Allergies  Allergen Reactions  . Cephalexin Other (See Comments)    Unknown  . Codeine Nausea And Vomiting    Patient Measurements: Height: 5\' 1"  (154.9 cm) Weight: 188 lb 7.9 oz (85.5 kg) IBW/kg (Calculated) : 47.8  Vital Signs: Temp: 97.9 F (36.6 C) (03/06 1610) Temp src: Oral (03/06 0812) BP: 128/60 mmHg (03/06 1000) Pulse Rate: 92 (03/06 1000) Intake/Output from previous day: 03/05 0701 - 03/06 0700 In: 1891.8 [I.V.:903.8; IV Piggyback:400; TPN:588] Out: 5116 [Urine:575; Emesis/NG output:270; Drains:800; Stool:475] Intake/Output from this shift: Total I/O In: 277.5 [I.V.:142.5; TPN:135] Out: 1101 [Drains:150; Other:951]  Labs:  Recent Labs  Sep 01, 2013 1200  08/08/13 0405 08/08/13 1605 08/09/13 0500  WBC 34.0*  --  35.9*  --   --   HGB 9.3*  --  9.0*  --   --   PLT 206  --  208  --   --   CREATININE 2.31*  < > 2.45* 2.60* 1.96*  < > = values in this interval not displayed. Estimated Creatinine Clearance: 26.1 ml/min (by C-G formula based on Cr of 1.96). No results found for this basename: VANCOTROUGH, Leodis Binet, VANCORANDOM, GENTTROUGH, GENTPEAK, GENTRANDOM, TOBRATROUGH, TOBRAPEAK, TOBRARND, AMIKACINPEAK, AMIKACINTROU, AMIKACIN,  in the last 72 hours   Microbiology: Recent Results (from the past 720 hour(s))  MRSA PCR SCREENING     Status: None   Collection Time    Sep 01, 2013 11:24 AM      Result Value Ref Range Status   MRSA by PCR NEGATIVE  NEGATIVE Final   Comment:            The GeneXpert MRSA Assay (FDA     approved for NASAL specimens     only), is one component of a     comprehensive MRSA colonization     surveillance program. It is not     intended to diagnose MRSA     infection nor to guide or     monitor treatment for     MRSA infections.  URINE CULTURE     Status: None   Collection Time    09/01/13 11:26 AM       Result Value Ref Range Status   Specimen Description URINE, CATHETERIZED   Final   Special Requests Normal   Final   Culture  Setup Time     Final   Value: 09/01/2013 12:02     Performed at [facility] Count     Final   Value: >=100,000 COLONIES/ML     Performed at [facility]   Culture     Final   Value: Multiple bacterial morphotypes present, none predominant. Suggest appropriate recollection if clinically indicated.     Performed at [facility]   Report Status 08/08/2013 FINAL   Final  CULTURE, BLOOD (ROUTINE X 2)     Status: None   Collection Time    09/01/2013  4:20 PM      Result Value Ref Range Status   Specimen Description BLOOD   Final   Special Requests     Final   Value: BOTTLES DRAWN AEROBIC AND ANAEROBIC 10CC LEFT IJ CVC   Culture  Setup Time     Final   Value: Sep 01, 2013 20:59     Performed at [facility]   Culture     Final   Value:  BLOOD CULTURE RECEIVED NO GROWTH TO DATE CULTURE WILL BE HELD FOR 5 DAYS BEFORE ISSUING A FINAL NEGATIVE REPORT     [facility]formed at [facility]   Report Status PENDING   Incomplete    Anti-infectives   Start     Dose/Rate Route Frequency Ordered Stop   08/10/13 0600  imipenem-cilastatin (PRIMAXIN) 250 mg in sodium chloride 0.9 % 100 mL IVPB     250 mg 200 mL/hr over 30 Minutes Intravenous 3 times per day 08/09/13 0021     08/09/13 1000  imipenem-cilastatin (PRIMAXIN) 250 mg in sodium chloride 0.9 % 100 mL IVPB  Status:  Discontinued     250 mg 200 mL/hr over 30 Minutes Intravenous Every 12 hours 08/09/13 0019 08/09/13 0021   08/08/13 1000  micafungin (MYCAMINE) 100 mg in sodium chloride 0.9 % 100 mL IVPB  Status:  Discontinued     100 mg 100 mL/hr over 1 Hours Intravenous Daily 08/08/13 0950 08/08/13 1001   2013-11-16 1800  vancomycin (VANCOCIN) IVPB 1000 mg/200 mL premix     1,000 mg 200 mL/hr over 60 Minutes Intravenous Every 24 hours 2013-11-16 1625     2013-11-16 1700   imipenem-cilastatin (PRIMAXIN) 250 mg in sodium chloride 0.9 % 100 mL IVPB  Status:  Discontinued     250 mg 200 mL/hr over 30 Minutes Intravenous 3 times per day 2013-11-16 1624 08/09/13 0019      Assessment: 73 year old female who continues on Vancomycin and Primaxin for Enterococcus UTI and intraabdominal infection.  Her antibiotic doses are appropriate for CRRT.  She is tolerating CRRT at this time.  Goal of Therapy:  Vancomycin trough level 15-20 mcg/ml  Plan:  Continue Imipenem 250mg  IV q8h Continue Vancomycin 1gm IV q24h Anticipate checking Vancomycin trough Sunday or Monday Follow for CRRT tolerance  Estella HuskMichelle Zofia Peckinpaugh, Pharm.D., BCPS, AAHIVP Clinical Pharmacist Phone: [phone] or [phone] 08/09/2013, 10:41 AM

## 2013-08-09 NOTE — Progress Notes (Signed)
PARENTERAL NUTRITION CONSULT NOTE:  FOLLOW-UP  Pharmacy Consult:  TPN Indication:  Suspect TF intolerance d/t to severe edematous bowel  Allergies  Allergen Reactions  . Cephalexin Other (See Comments)    Unknown  . Codeine Nausea And Vomiting    Patient Measurements: Height: 5\' 1"  (154.9 cm) Weight: 188 lb 7.9 oz (85.5 kg) IBW/kg (Calculated) : 47.8 Usual Weight: 55 kg (this was admit weight at [facility] 2/13)  Vital Signs: Temp: 99 F (37.2 C) (03/06 0430) Temp src: Oral (03/06 0430) BP: 108/48 mmHg (03/06 0500) Pulse Rate: 107 (03/06 0600) Intake/Output from previous day: 03/05 0701 - 03/06 0700 In: 1799.3 [I.V.:856.3; IV Piggyback:400; TPN:543] Out: 4226 [Urine:575; Emesis/NG output:270; Drains:575; Stool:125]  Labs:  Recent Labs  08/13/2013 1200 08/08/13 0405 08/09/13 0405  WBC 34.0* 35.9*  --   HGB 9.3* 9.0*  --   HCT 26.3* 26.7*  --   PLT 206 208  --   APTT  --   --  >200*     Recent Labs  08/31/2013 1200 08/11/2013 1648 08/08/13 0405 08/08/13 0450 08/08/13 1605 08/09/13 0500  NA 136* 135* 134*  --  133* 133*  K 5.7* 5.7* 5.8*  --  5.8* 4.3  CL 100 100 99  --  99 98  CO2 19 19 19   --  18* 19  GLUCOSE 116* 111* 88  --  73 112*  BUN 87* 85* 91*  --  93* 71*  CREATININE 2.31* 2.29* 2.45*  --  2.60* 1.96*  CALCIUM 7.9* 7.9* 8.2*  --  8.0* 7.8*  MG  --   --  2.1  --   --  2.2  PHOS  --   --  7.2*  --   --  5.3*  PROT 4.6*  --  4.7*  --   --   --   ALBUMIN 2.4*  --  2.2*  --   --  2.1*  AST 446*  --  555*  --   --   --   ALT 38*  --  34  --   --   --   ALKPHOS 102  --  101  --   --   --   BILITOT 1.6*  --  1.7*  --   --   --   PREALBUMIN  --   --  15.6*  --   --   --   TRIG  --   --   --  91  --   --    Estimated Creatinine Clearance: 26.1 ml/min (by C-G formula based on Cr of 1.96).    Recent Labs  08/08/13 1919 08/08/13 2349 08/09/13 0427  GLUCAP 87 130* 124*     Insulin Requirements in the past 24 hours:  None - SSI started  today  Assessment: 37 YOF admitted 07/19/13 to [facility] for planned repair of rectoenteric fistula, which was completed. Course complicated by ileus, renal failure, and cardiopulmonary arrest.  Patient transferred to [facility] on 08/10/2013 for further care.  Pharmacy consulted to manage TPN.  GI: bowels are too edematous to tolerate TF and may trickle feed when volume status improves per Surgery.  No longer suspects EF fistula. Baseline prealbumin was 15.6 on transfer to [facility], TG WNL, NG + drains + stool O/P trending up. Endo: no hx DM - required 10 units insulin in TPN at [place] but none in TPN right now.  Hypoglycemic yesterday, CBGs trended up since [place]: none in TPN - hyponatremia, Phos trending  down but remains elevated, others WNL Renal: hx CKD started on CRRT on 08/08/13 for volume regulation - SCr down 1.96, up ~70lbs since admit with diffuse anasarca ([facility], [facility]).  NS at 10 ml/hr.  Making some urine 0.3 ml/kg/hr.  TPN providing 2 g/kg/day of protein. Pulm: [facility]. FiO2 30% Cards: AoC dHF (EF 35-40%), s/p [facility] - MAP 60s on norepi, HR mostly normal, NSR Hepatobil: LFTs WNL except for AST at 555; tbili elevated at 1.7 Neuro: Fent gtt - GCS 13 ID: Vanc/Primaxin for intra-abd infxn + Enterococcus UTI, afebrile, leukocytosis with WBC 35.9K. Best Practices: PPI IV, SQ heparin, MC TPN Access: CVC placed 08/06/2013 TPN day#: [facility]   Current Nutrition:  None - was on Clinimix 5/15 at 7660ml/hr [facility] - placed on hold 3/3 (started 2/27). Was also on TF at various rates [facility] also but didn't tolerate.  Nutritional Goals:  1612 kCal (goal for permissive underfeeding ~1200 kcal); protein 115 gm per day   Plan:  - Increase Clinimix 5/15 (no electrolytes) to 83 ml/hr and give lipids at 10 ml/hr on MWF only to minimize kCal provision.  Unfortunately with premixed TPN, will not be able to  provide very low kCal and high protein diet but will match as closely as we can.  TPN will provide a daily average of 1620 kCal and 100gm protein daily. - TPN will provide ~2L fluid per day.  Renal aware. - Daily IV multivitamin in TPN - Trace elements M/W/F with national shortage - Watch CBGs - F/U daily    Royce Stegman D. Laney Potashang, PharmD, BCPS Pager:  [phone] 08/09/2013, 9:45 AM

## 2013-08-09 NOTE — Progress Notes (Signed)
Patient ID: Leah [name], female   DOB: [birth year], 73 y.o.   MRN: [id number]    Subjective: Pt intubated  Objective: Vital signs in last 24 hours: Temp:  [93 F (33.9 C)-99 F (37.2 C)] 97.9 F (36.6 C) (03/06 0812) Pulse Rate:  [70-112] 77 (03/06 0805) Resp:  [0-30] 22 (03/06 0805) BP: (64-141)/(22-99) 126/58 mmHg (03/06 0805) SpO2:  [93 %-100 %] 100 % (03/06 0805) FiO2 (%):  [30 %] 30 % (03/06 0805) Weight:  [188 lb 7.9 oz (85.5 kg)] 188 lb 7.9 oz (85.5 kg) (03/06 0500)    Intake/Output from previous day: 03/05 0701 - 03/06 0700 In: 1891.8 [I.V.:903.8; IV Piggyback:400; TPN:588] Out: 5116 [Urine:575; Emesis/NG output:270; Drains:800; Stool:475] Intake/Output this shift: Total I/O In: 92.5 [I.V.:47.5; TPN:45] Out: 460 [Drains:150; Other:310]  PE: Abd: a little bit softer as some diuresis.  About 1100cc in her drainage bag that has come from her wound with diuresis.  Does NOT appear enteric.  Staples intact.  Ostomy in place with same serous drainage present as from wound.  Lab Results:   Recent Labs  08/31/2013 1200 08/08/13 0405  WBC 34.0* 35.9*  HGB 9.3* 9.0*  HCT 26.3* 26.7*  PLT 206 208   BMET  Recent Labs  08/08/13 1605 08/09/13 0500  NA 133* 133*  K 5.8* 4.3  CL 99 98  CO2 18* 19  GLUCOSE 73 112*  BUN 93* 71*  CREATININE 2.60* 1.96*  CALCIUM 8.0* 7.8*   PT/INR No results found for this basename: LABPROT, INR,  in the last 72 hours CMP     Component Value Date/Time   NA 133* 08/09/2013 0500   K 4.3 08/09/2013 0500   CL 98 08/09/2013 0500   CO2 19 08/09/2013 0500   GLUCOSE 112* 08/09/2013 0500   BUN 71* 08/09/2013 0500   CREATININE 1.96* 08/09/2013 0500   CALCIUM 7.8* 08/09/2013 0500   PROT 4.7* 08/08/2013 0405   ALBUMIN 2.1* 08/09/2013 0500   AST 555* 08/08/2013 0405   ALT 34 08/08/2013 0405   ALKPHOS 101 08/08/2013 0405   BILITOT 1.7* 08/08/2013 0405   GFRNONAA 25* 08/09/2013 0500   GFRAA 28* 08/09/2013 0500   Lipase  No results found for this basename: lipase        Studies/Results: Dg Chest Port 1 View  08/08/2013   CLINICAL DATA:  Status post line placement.  EXAM: PORTABLE CHEST - 1 VIEW  COMPARISON:  Same day.  FINDINGS: Endotracheal and nasogastric tubes are unchanged in position. Interval placement of right internal jugular catheter line with distal tip in the expected position of the SVC. No pneumothorax is seen. Stable cardiomegaly. Stable bilateral perihilar and basilar opacity consistent with pulmonary edema or pneumonia. Status post coronary artery bypass graft. Left internal jugular catheter line is unchanged in position.  IMPRESSION: Interval placement of right internal jugular catheter line with distal tip in expected position of the SVC. No pneumothorax is seen. Stable other support apparatus. Continued presence of bilateral lung opacities consistent with pneumonia or edema.   Electronically Signed   By: Roque Lias M.D.   On: 08/08/2013 13:27   Dg Chest Port 1 View  08/08/2013   CLINICAL DATA:  Central line placement  EXAM: PORTABLE CHEST - 1 VIEW  COMPARISON:  DG CHEST 1V PORT dated 08/08/2013; DG CHEST 1V PORT dated 08/08/2013  FINDINGS: New right internal jugular vein dialysis catheter with its tip in the upper SVC and no pneumothorax. Tubular device is otherwise stable. Diffuse airspace  disease increased at the lung bases. Pleural effusions not excluded. Cardiomegaly.  IMPRESSION: Right internal jugular dialysis catheter placed with its tip in the SVC and no pneumothorax.  Worsening CHF.   Electronically Signed   By: Maryclare Bean M.D.   On: 08/08/2013 12:08   Dg Chest Port 1 View  08/08/2013   CLINICAL DATA:  Check endotracheal tube position  EXAM: PORTABLE CHEST - 1 VIEW  COMPARISON:  08/26/2013  FINDINGS: The endotracheal tube is again identified 5.2 cm above the carina. A right-sided central venous line has been removed in the interval. A left jugular line remains stable with the tip in the proximal right atrium. A nasogastric catheter is noted  proximally. Its distal aspect is not well visualized on this exam. Diffuse bilateral airspace disease and vascular congestion is noted. Postsurgical changes are seen.  IMPRESSION: Increasing vascular congestion superimposed over the bilateral infiltrates.  Tubes and lines as described.   Electronically Signed   By: Alcide Clever M.D.   On: 08/08/2013 08:08   Dg Chest Portable 1 View  08/16/2013   CLINICAL DATA:  Central line placement.  EXAM: PORTABLE CHEST - 1 VIEW  COMPARISON:  Single view of the chest 08/13/2013 at 05/2010.  FINDINGS: New left IJ catheter is in place with the tip projecting over the right atrium. Right IJ catheter with the tip projecting in the mid superior vena cava is unchanged. Endotracheal tube and NG tube are again noted.  Diffuse bilateral airspace disease persists. No pneumothorax. Cardiomegaly noted. Recommend withdrawal of 3.5 x 4 cm.  IMPRESSION: New left IJ catheter tip projects in the right atrium. The line should be withdrawn 3.5-4 cm. Support apparatus is otherwise unchanged. No pneumothorax.  No change in diffuse bilateral airspace disease.   Electronically Signed   By: Drusilla Kanner M.D.   On: 08/08/2013 17:47   Dg Chest Port 1 View  08/29/2013   CLINICAL DATA:  73 year old female respiratory failure. Initial encounter.  EXAM: PORTABLE CHEST - 1 VIEW  COMPARISON:  0510 hr the same day and earlier.  FINDINGS: Portable AP upright view at 1211 hrs. Endotracheal tube tip in good position between the clavicles and carina. Stable enteric tube coursing to the left upper quadrant. Stable right IJ central line. Sequelae of CABG again noted. Increased EKG leads and wires overlying the chest.  Stable cardiac size and mediastinal contours. Stable to mildly regressed confluent and patchy bilateral perihilar opacity. No pneumothorax. No large effusion.  IMPRESSION: 1.  Stable lines and tubes. 2. No significant change in ventilation with confluent bilateral perihilar opacity, with Main  differential considerations of bilateral pneumonia/aspiration and asymmetric pulmonary edema.   Electronically Signed   By: Augusto Gamble M.D.   On: 08/18/2013 12:34    Anti-infectives: Anti-infectives   Start     Dose/Rate Route Frequency Ordered Stop   08/10/13 0600  imipenem-cilastatin (PRIMAXIN) 250 mg in sodium chloride 0.9 % 100 mL IVPB     250 mg 200 mL/hr over 30 Minutes Intravenous 3 times per day 08/09/13 0021     08/09/13 1000  imipenem-cilastatin (PRIMAXIN) 250 mg in sodium chloride 0.9 % 100 mL IVPB  Status:  Discontinued     250 mg 200 mL/hr over 30 Minutes Intravenous Every 12 hours 08/09/13 0019 08/09/13 0021   08/08/13 1000  micafungin (MYCAMINE) 100 mg in sodium chloride 0.9 % 100 mL IVPB  Status:  Discontinued     100 mg 100 mL/hr over 1 Hours Intravenous Daily 08/08/13  [id number] 08/08/13 1001   07-10-13 1800  vancomycin (VANCOCIN) IVPB 1000 mg/200 mL premix     1,000 mg 200 mL/hr over 60 Minutes Intravenous Every 24 hours 07-10-13 1625     07-10-13 1700  imipenem-cilastatin (PRIMAXIN) 250 mg in sodium chloride 0.9 % 100 mL IVPB  Status:  Discontinued     250 mg 200 mL/hr over 30 Minutes Intravenous 3 times per day 07-10-13 1624 08/09/13 0019       Assessment/Plan  1. Post op management of abdomen s/p SBR and LOA 2. VDRF  Plan: 1. Does not appear to have EC fistula.  Suspect this is drainage secondary to severe fluid overload and weeping.  Will continue to follow.  Continue current wound care.  Cont staples for now.   LOS: 2 days    Javaun Dimperio E 08/09/2013, 9:06 AM Pager: [phone]

## 2013-08-09 NOTE — Procedures (Signed)
Under sterile conditions the LIJ CVC was withdrawn 5 cm and resutured into place. A chest X-ray is pending.

## 2013-08-09 NOTE — Progress Notes (Signed)
Patient interviewed and examined, agree with PA note above.  Mariella SaaBenjamin T Lynea Rollison MD, FACS  08/09/2013 9:54 AM

## 2013-08-09 NOTE — Progress Notes (Signed)
Assessment:  1 Oliguric AKI, hemodynamically mediated  2 Volume overload, severe ---responding to ultrafiltration  3 Acidosis, metabolic, improved  4 Hyperkalemia, improved  Plan:  Cont CVVHDF for volume and metabolic derangement, adjustments made, cont -250cc/hr goal  Subjective: Interval History: Tolerating CVVHDF  Objective: Vital signs in last 24 hours: Temp:  [93 F (33.9 C)-99 F (37.2 C)] 97.9 F (36.6 C) (03/06 0812) Pulse Rate:  [70-112] 84 (03/06 1206) Resp:  [0-30] 19 (03/06 1206) BP: (64-141)/(22-99) 98/47 mmHg (03/06 1130) SpO2:  [95 %-100 %] 97 % (03/06 1206) FiO2 (%):  [30 %] 30 % (03/06 1206) Weight:  [85.5 kg (188 lb 7.9 oz)] 85.5 kg (188 lb 7.9 oz) (03/06 0500) Weight change: -0.5 kg (-1 lb 1.6 oz)  Intake/Output from previous day: 03/05 0701 - 03/06 0700 In: 1891.8 [I.V.:903.8; IV Piggyback:400; TPN:588] Out: 5116 [Urine:575; Emesis/NG output:270; Drains:800; Stool:475] Intake/Output this shift: Total I/O In: 362.5 [I.V.:182.5; TPN:180] Out: 2032 [Urine:25; Drains:425; Other:1582]  General appearance: more alert today Extremities: edema difuse severe anasarca  Lab Results:  Recent Labs  08/08/13 0405 08/09/13 1033  WBC 35.9* 33.3*  HGB 9.0* 9.1*  HCT 26.7* 26.9*  PLT 208 193   BMET:  Recent Labs  08/08/13 1605 08/09/13 0500  NA 133* 133*  K 5.8* 4.3  CL 99 98  CO2 18* 19  GLUCOSE 73 112*  BUN 93* 71*  CREATININE 2.60* 1.96*  CALCIUM 8.0* 7.8*   No results found for this basename: PTH,  in the last 72 hours Iron Studies: No results found for this basename: IRON, TIBC, TRANSFERRIN, FERRITIN,  in the last 72 hours Studies/Results: Dg Chest Port 1 View  08/08/2013   CLINICAL DATA:  Status post line placement.  EXAM: PORTABLE CHEST - 1 VIEW  COMPARISON:  Same day.  FINDINGS: Endotracheal and nasogastric tubes are unchanged in position. Interval placement of right internal jugular catheter line with distal tip in the expected position of  the SVC. No pneumothorax is seen. Stable cardiomegaly. Stable bilateral perihilar and basilar opacity consistent with pulmonary edema or pneumonia. Status post coronary artery bypass graft. Left internal jugular catheter line is unchanged in position.  IMPRESSION: Interval placement of right internal jugular catheter line with distal tip in expected position of the SVC. No pneumothorax is seen. Stable other support apparatus. Continued presence of bilateral lung opacities consistent with pneumonia or edema.   Electronically Signed   By: Roque Lias M.D.   On: 08/08/2013 13:27   Dg Chest Port 1 View  08/08/2013   CLINICAL DATA:  Central line placement  EXAM: PORTABLE CHEST - 1 VIEW  COMPARISON:  DG CHEST 1V PORT dated 08/08/2013; DG CHEST 1V PORT dated 08/08/2013  FINDINGS: New right internal jugular vein dialysis catheter with its tip in the upper SVC and no pneumothorax. Tubular device is otherwise stable. Diffuse airspace disease increased at the lung bases. Pleural effusions not excluded. Cardiomegaly.  IMPRESSION: Right internal jugular dialysis catheter placed with its tip in the SVC and no pneumothorax.  Worsening CHF.   Electronically Signed   By: Maryclare Bean M.D.   On: 08/08/2013 12:08   Dg Chest Port 1 View  08/08/2013   CLINICAL DATA:  Check endotracheal tube position  EXAM: PORTABLE CHEST - 1 VIEW  COMPARISON:  08/28/2013  FINDINGS: The endotracheal tube is again identified 5.2 cm above the carina. A right-sided central venous line has been removed in the interval. A left jugular line remains stable with the tip in  the proximal right atrium. A nasogastric catheter is noted proximally. Its distal aspect is not well visualized on this exam. Diffuse bilateral airspace disease and vascular congestion is noted. Postsurgical changes are seen.  IMPRESSION: Increasing vascular congestion superimposed over the bilateral infiltrates.  Tubes and lines as described.   Electronically Signed   By: Alcide CleverMark  Lukens M.D.    On: 08/08/2013 08:08   Dg Chest Portable 1 View  08/31/2013   CLINICAL DATA:  Central line placement.  EXAM: PORTABLE CHEST - 1 VIEW  COMPARISON:  Single view of the chest 09/02/2013 at 05/2010.  FINDINGS: New left IJ catheter is in place with the tip projecting over the right atrium. Right IJ catheter with the tip projecting in the mid superior vena cava is unchanged. Endotracheal tube and NG tube are again noted.  Diffuse bilateral airspace disease persists. No pneumothorax. Cardiomegaly noted. Recommend withdrawal of 3.5 x 4 cm.  IMPRESSION: New left IJ catheter tip projects in the right atrium. The line should be withdrawn 3.5-4 cm. Support apparatus is otherwise unchanged. No pneumothorax.  No change in diffuse bilateral airspace disease.   Electronically Signed   By: Drusilla Kannerhomas  Dalessio M.D.   On: 08/20/2013 17:47   Scheduled: . antiseptic oral rinse  15 mL Mouth Rinse QID  . chlorhexidine  15 mL Mouth Rinse BID  . heparin subcutaneous  5,000 Units Subcutaneous 3 times per day  . [START ON 08/10/2013] imipenem-cilastatin  250 mg Intravenous 3 times per day  . insulin aspart  0-15 Units Subcutaneous 6 times per day  . pantoprazole (PROTONIX) IV  40 mg Intravenous Q24H  . vancomycin  1,000 mg Intravenous Q24H     LOS: 2 days   Nyema Hachey C 08/09/2013,12:19 PM

## 2013-08-09 NOTE — Progress Notes (Addendum)
PULMONARY / CRITICAL CARE MEDICINE   Name: Leah [name] MRN: [id number] DOB: 1941/03/24    ADMISSION DATE:  08/19/2013  REFERRING MD :  [facility]  PRIMARY SERVICE: PCCM  CHIEF COMPLAINT:  Respiratory Failure   BRIEF PATIENT DESCRIPTION: 72 y/o admitted to [facility] for planned repair of enterocolonic fistula.  Course was complicated by ileus, renal failure, cardiopulmonary arrest.  Transferred to [facility] on 3/4 for further care.   SIGNIFICANT EVENTS / STUDIES:  2/13  OR >>> Ex-lap with small bowel resection, extensive lysis of adhesions, vomiting 2/22  Cardiac arrest 3/5    TTE >>> EF 35-40, grade 2 diastolic dysfunction  3/5    CVVHD started  LINES / TUBES: OETT 2/22 >>> R IJ TLC 2/14 >>> 3/4 L IJ TLC 3/4 >>> Foley ??? >>> NGT ??? 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  BP improved, likely erroneous measurement previously. Tolerating CCVHD.  VITAL SIGNS: Temp:  [93 F (33.9 C)-99 F (37.2 C)] 97.9 F (36.6 C) (03/06 0812) Pulse Rate:  [70-112] 92 (03/06 1000) Resp:  [0-30] 22 (03/06 1000) BP: (64-141)/(22-99) 128/60 mmHg (03/06 1000) SpO2:  [93 %-100 %] 100 % (03/06 1000) FiO2 (%):  [30 %] 30 % (03/06 0805) Weight:  [85.5 kg (188 lb 7.9 oz)] 85.5 kg (188 lb 7.9 oz) (03/06 0500)  HEMODYNAMICS:   VENTILATOR SETTINGS: Vent Mode:  [-] PSV;CPAP FiO2 (%):  [30 %] 30 % Set Rate:  [26 bmp] 26 bmp Vt Set:  [380 mL] 380 mL PEEP:  [5 cmH20] 5 cmH20 Pressure Support:  [8 cmH20] 8 cmH20 Plateau Pressure:  [23 cmH20-30 cmH20] 23 cmH20  INTAKE / OUTPUT: Intake/Output     03/05 0701 - 03/06 0700 03/06 0701 - 03/07 0700   I.V. (mL/kg) 903.8 (10.6) 142.5 (1.7)   NG/GT     IV Piggyback 400    TPN 588 135   Total Intake(mL/kg) 1891.8 (22.1) 277.5 (3.2)    Urine (mL/kg/hr) 575 (0.3)    Emesis/NG output 270 (0.1)    Drains 800 (0.4) 150 (0.5)   Other 2996 (1.5) 951 (3.3)   Stool 475 (0.2)    Total Output 5116 1101   Net -3224.2 -823.5         PHYSICAL EXAMINATION: General:  No distress Neuro:  Comfortable, arouses to stimulation, follows commands HEENT:  OETT / NGT Cardiovascular:  Regular, no murmurs Lungs:  Bilateral scattered rhonchi Abdomen:  LLQ colostomy, RLQ fistula draining serosanguinous fluid Musculoskeletal:  Moves all extremities Skin:  Anasarca  LABS:  CBC  Recent Labs Lab 08/29/2013 1200 08/08/13 0405  WBC 34.0* 35.9*  HGB 9.3* 9.0*  HCT 26.3* 26.7*  PLT 206 208   Coag's  Recent Labs Lab 08/09/13 0405  APTT >200*    BMET  Recent Labs Lab 08/08/13 0405 08/08/13 1605 08/09/13 0500  NA 134* 133* 133*  K 5.8* 5.8* 4.3  CL 99 99 98  CO2 19 18* 19  BUN 91* 93* 71*  CREATININE 2.45* 2.60* 1.96*  GLUCOSE 88 73 112*   Electrolytes  Recent Labs Lab 08/08/13 0405 08/08/13 1605 08/09/13 0500  CALCIUM 8.2* 8.0* 7.8*  MG 2.1  --  2.2  PHOS 7.2*  --  5.3*   Sepsis Markers No results found for this basename: LATICACIDVEN, PROCALCITON, O2SATVEN,  in the last 168 hours  ABG  Recent Labs Lab 08/09/2013 1314  PHART 7.315*  PCO2ART 37.3  PO2ART 67.0*   Liver Enzymes  Recent Labs Lab 08/22/2013 1200 08/08/13 0405 08/09/13 0500  AST 446* 555*  --   ALT 38* 34  --   ALKPHOS 102 101  --   BILITOT 1.6* 1.7*  --   ALBUMIN 2.4* 2.2* 2.1*   Cardiac Enzymes No results found for this basename: TROPONINI, PROBNP,  in the last 168 hours  Glucose  Recent Labs Lab 08/08/13 1528 08/08/13 1531 08/08/13 1919 08/08/13 2349 08/09/13 0427 08/09/13 0805  GLUCAP 54* 72 87 130* 124* 117*   IMAGING:  Dg Chest Port 1 View  08/08/2013   CLINICAL DATA:  Status post line placement.  EXAM: PORTABLE CHEST - 1 VIEW  COMPARISON:  Same day.  FINDINGS: Endotracheal and nasogastric tubes are unchanged in  position. Interval placement of right internal jugular catheter line with distal tip in the expected position of the SVC. No pneumothorax is seen. Stable cardiomegaly. Stable bilateral perihilar and basilar opacity consistent with pulmonary edema or pneumonia. Status post coronary artery bypass graft. Left internal jugular catheter line is unchanged in position.  IMPRESSION: Interval placement of right internal jugular catheter line with distal tip in expected position of the SVC. No pneumothorax is seen. Stable other support apparatus. Continued presence of bilateral lung opacities consistent with pneumonia or edema.   Electronically Signed   By: Roque Lias M.D.   On: 08/08/2013 13:27   Dg Chest Port 1 View  08/08/2013   CLINICAL DATA:  Central line placement  EXAM: PORTABLE CHEST - 1 VIEW  COMPARISON:  DG CHEST 1V PORT dated 08/08/2013; DG CHEST 1V PORT dated 08/08/2013  FINDINGS: New right internal jugular vein dialysis catheter with its tip in the upper SVC and no pneumothorax. Tubular device is otherwise stable. Diffuse airspace disease increased at the lung bases. Pleural effusions not excluded. Cardiomegaly.  IMPRESSION: Right internal jugular dialysis catheter placed with its tip in the SVC and no pneumothorax.  Worsening CHF.   Electronically Signed   By: Maryclare Bean M.D.   On: 08/08/2013 12:08   Dg Chest Port 1 View  08/08/2013   CLINICAL DATA:  Check endotracheal tube position  EXAM: PORTABLE CHEST - 1 VIEW  COMPARISON:  08/18/2013  FINDINGS: The endotracheal tube is again identified 5.2 cm above the carina. A right-sided central venous line has been removed in the interval. A left jugular line remains stable with the tip in the proximal right atrium. A nasogastric catheter is noted proximally. Its distal aspect is not well visualized on this exam. Diffuse bilateral airspace disease and vascular congestion is noted. Postsurgical changes are seen.  IMPRESSION: Increasing vascular congestion superimposed  over the bilateral infiltrates.  Tubes and lines as described.   Electronically Signed   By: Alcide Clever M.D.   On: 08/08/2013 08:08   Dg Chest Portable 1 View  08/31/2013   CLINICAL DATA:  Central line placement.  EXAM: PORTABLE CHEST - 1 VIEW  COMPARISON:  Single view of the chest 08/19/2013 at 05/2010.  FINDINGS: New left IJ catheter is in place with the tip projecting over the right atrium. Right IJ catheter with the tip projecting in the mid superior vena cava is unchanged. Endotracheal tube and NG tube are again noted.  Diffuse bilateral airspace disease persists. No pneumothorax. Cardiomegaly noted. Recommend withdrawal of 3.5 x 4 cm.  IMPRESSION: New left IJ catheter  tip projects in the right atrium. The line should be withdrawn 3.5-4 cm. Support apparatus is otherwise unchanged. No pneumothorax.  No change in diffuse bilateral airspace disease.   Electronically Signed   By: Drusilla Kannerhomas  Dalessio M.D.   On: 08/12/2013 17:47   Dg Chest Port 1 View  08/16/2013   CLINICAL DATA:  73 year old female respiratory failure. Initial encounter.  EXAM: PORTABLE CHEST - 1 VIEW  COMPARISON:  0510 hr the same day and earlier.  FINDINGS: Portable AP upright view at 1211 hrs. Endotracheal tube tip in good position between the clavicles and carina. Stable enteric tube coursing to the left upper quadrant. Stable right IJ central line. Sequelae of CABG again noted. Increased EKG leads and wires overlying the chest.  Stable cardiac size and mediastinal contours. Stable to mildly regressed confluent and patchy bilateral perihilar opacity. No pneumothorax. No large effusion.  IMPRESSION: 1.  Stable lines and tubes. 2. No significant change in ventilation with confluent bilateral perihilar opacity, with Main differential considerations of bilateral pneumonia/aspiration and asymmetric pulmonary edema.   Electronically Signed   By: Augusto GambleLee  Hall M.D.   On: 08/18/2013 12:34    ASSESSMENT / PLAN:  PULMONARY A: Acute respiratory  failure Pulmonary edema Possible HCAP COPD without exacerbation P:   Goal pH>7.30, SpO2>92 Continuous mechanical support VAP bundle Daily SBT Trend ABG/CXR Albuterol PRN Mental status is limiting extubation  CARDIOVASCULAR A:  Sepsis S/p Cardiopulmonary arrest Acute on chronic systolic and diastolic CHF NSTEMI P:  Goal MAP > 65 D/c Neo-Synephrine Titrate Levophed gtt to off BB / ACEI contraindicated as on vasopressors DNR  RENAL A:   AKI Hyperkalemia Volume overload, but fluid status improving neg 1L >>> neg 5L P:   Nephrology following Trend BMP Place HD cath Start CVVHD  GASTROINTESTINAL A:   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 P:   SSI  Cortisol level  NEUROLOGIC A:   Acute encephalopathy P:   Goal RASS 0 to -1 Change Fentanyl to PRN  I have personally obtained history, examined patient, evaluated and interpreted laboratory and imaging results, reviewed medical records, formulated assessment / plan and placed orders.  CRITICAL CARE:  The patient is critically ill with multiple organ systems failure and requires high complexity decision making for assessment and support, frequent evaluation and titration of therapies, application of advanced monitoring technologies and extensive interpretation of multiple databases. Critical Care Time devoted to patient care services described in this note is 35 minutes.   Lonia FarberZUBELEVITSKIY, Ivett Luebbe, MD Pulmonary and Critical Care Medicine [facility] Pager: [phone]  08/09/2013, 10:21 AM

## 2013-08-10 ENCOUNTER — Inpatient Hospital Stay (HOSPITAL_COMMUNITY): Payer: Medicare Other

## 2013-08-10 DIAGNOSIS — T8189XA Other complications of procedures, not elsewhere classified, initial encounter: Secondary | ICD-10-CM

## 2013-08-10 LAB — RENAL FUNCTION PANEL
Albumin: 1.9 g/dL — ABNORMAL LOW (ref 3.5–5.2)
Albumin: 1.8 g/dL — ABNORMAL LOW (ref 3.5–5.2)
BUN: 48 mg/dL — AB (ref 6–23)
BUN: 43 mg/dL — ABNORMAL HIGH (ref 6–23)
CHLORIDE: 97 meq/L (ref 96–112)
CHLORIDE: 95 meq/L — AB (ref 96–112)
CO2: 22 mEq/L (ref 19–32)
CO2: 22 meq/L (ref 19–32)
CREATININE: 1.24 mg/dL — AB (ref 0.50–1.10)
Calcium: 7.5 mg/dL — ABNORMAL LOW (ref 8.4–10.5)
Calcium: 7.7 mg/dL — ABNORMAL LOW (ref 8.4–10.5)
Creatinine, Ser: 0.97 mg/dL (ref 0.50–1.10)
GFR calc Af Amer: 49 mL/min — ABNORMAL LOW (ref >90)
GFR calc non Af Amer: 43 mL/min — ABNORMAL LOW (ref >90)
GFR calc non Af Amer: 57 mL/min — ABNORMAL LOW (ref >90)
GFR, EST AFRICAN AMERICAN: 67 mL/min — AB (ref >90)
GLUCOSE: 208 mg/dL — AB (ref 70–99)
Glucose, Bld: 247 mg/dL — ABNORMAL HIGH (ref 70–99)
POTASSIUM: 3.5 meq/L — AB (ref 3.7–5.3)
Phosphorus: 3 mg/dL (ref 2.3–4.6)
Phosphorus: 2.3 mg/dL (ref 2.3–4.6)
Potassium: 4.1 mEq/L (ref 3.7–5.3)
Sodium: 133 mEq/L — ABNORMAL LOW (ref 137–147)
Sodium: 128 mEq/L — ABNORMAL LOW (ref 137–147)

## 2013-08-10 LAB — MAGNESIUM: MAGNESIUM: 2.1 mg/dL (ref 1.5–2.5)

## 2013-08-10 LAB — GLUCOSE, CAPILLARY
GLUCOSE-CAPILLARY: 182 mg/dL — AB (ref 70–99)
GLUCOSE-CAPILLARY: 180 mg/dL — AB (ref 70–99)
Glucose-Capillary: 188 mg/dL — ABNORMAL HIGH (ref 70–99)
Glucose-Capillary: 191 mg/dL — ABNORMAL HIGH (ref 70–99)
Glucose-Capillary: 205 mg/dL — ABNORMAL HIGH (ref 70–99)
Glucose-Capillary: 276 mg/dL — ABNORMAL HIGH (ref 70–99)

## 2013-08-10 LAB — POCT ACTIVATED CLOTTING TIME
ACTIVATED CLOTTING TIME: 221 s
Activated Clotting Time: 238 seconds
Activated Clotting Time: 238 seconds

## 2013-08-10 LAB — CBC
HCT: 23.8 % — ABNORMAL LOW (ref 36.0–46.0)
HEMOGLOBIN: 8.1 g/dL — AB (ref 12.0–15.0)
MCH: 30.3 pg (ref 26.0–34.0)
MCHC: 34 g/dL (ref 30.0–36.0)
MCV: 89.1 fL (ref 78.0–100.0)
Platelets: 132 10*3/uL — ABNORMAL LOW (ref 150–400)
RBC: 2.67 MIL/uL — ABNORMAL LOW (ref 3.87–5.11)
RDW: 18.2 % — ABNORMAL HIGH (ref 11.5–15.5)
WBC: 23.9 10*3/uL — ABNORMAL HIGH (ref 4.0–10.5)

## 2013-08-10 LAB — APTT

## 2013-08-10 MED ORDER — FAT EMULSION 20 % IV EMUL
250.0000 mL | INTRAVENOUS | Status: AC
  Administered 2013-08-10: 250 mL via INTRAVENOUS
  Filled 2013-08-10: qty 250

## 2013-08-10 MED ORDER — M.V.I. ADULT IV INJ
INTRAVENOUS | Status: AC
  Administered 2013-08-10: 18:00:00 via INTRAVENOUS
  Filled 2013-08-10: qty 2000

## 2013-08-10 MED ORDER — POTASSIUM CHLORIDE 10 MEQ/50ML IV SOLN
10.0000 meq | INTRAVENOUS | Status: AC
  Administered 2013-08-10 (×4): 10 meq via INTRAVENOUS
  Filled 2013-08-10 (×4): qty 50

## 2013-08-10 NOTE — Procedures (Signed)
Admit: 08/17/2013 LOS: 3  51F AKI in setting of small bowel resectiion / LOA for EC fistula c/b cardiac arrest with volume overload, acidosis, and hyperkalemia  Current CRRT Prescription: Start Date: 08/08/13   Catheter: IJ BFR: 250 Pre Blood Pump: 200 DFR: 1000 Replacement Rate: 200 Goal UF: -250/h Anticoagulation: yes Clotting: none    S: Good UF Machine not clotting ACTs remain high at low dose of heparin Some serosanguinous drainage from wound Stable dose of NE Hb at 8.1 this AM On TPN Weight down 22lb  O: 03/06 0701 - 03/07 0700 In: 2330.8 [I.V.:660.8; IV Piggyback:200; TPN:1470] Out: 9714 [Urine:85; Emesis/NG output:25; Drains:1545; Stool:325]  Filed Weights   08/08/13 0622 08/09/13 0500 08/10/13 0424  Weight: 86.6 kg (190 lb 14.7 oz) 85.5 kg (188 lb 7.9 oz) 76.6 kg (168 lb 14 oz)     Recent Labs Lab 08/09/13 0500 08/09/13 1830 08/10/13 0429  NA 133* 135* 133*  K 4.3 3.8 3.5*  CL 98 99 97  CO2 19 23 22   GLUCOSE 112* 156* 208*  BUN 71* 53* 48*  CREATININE 1.96* 1.46* 1.24*  CALCIUM 7.8* 7.6* 7.5*  PHOS 5.3* 4.4 3.0    Recent Labs Lab 08/26/2013 1200 08/08/13 0405 08/09/13 1033 08/10/13 0429  WBC 34.0* 35.9* 33.3* 23.9*  NEUTROABS  --  33.4*  --   --   HGB 9.3* 9.0* 9.1* 8.1*  HCT 26.3* 26.7* 26.9* 23.8*  MCV 85.9 87.0 87.9 89.1  PLT 206 208 193 132*    ABG    Component Value Date/Time   PHART 7.315* 08/13/2013 1314   PCO2ART 37.3 08/20/2013 1314   PO2ART 67.0* 08/06/2013 1314   HCO3 19.0* 08/06/2013 1314   TCO2 20 08/09/2013 1314   ACIDBASEDEF 7.0* 08/26/2013 1314   O2SAT 92.0 08/05/2013 1314    Plan:  1. Dialysis dependent AKI: cont CRRT today with unchanged vol goals.  By report 60lb above baseline when transferred.  Electrolytes stable.  Phos Stable.  If Hb cont to decline need to consider pulling anticoagulation off altogether, but clotting very likely given high UF.    Sabra Heckyan Reyansh Kushnir, MD [facility] pgr [phone]

## 2013-08-10 NOTE — Progress Notes (Signed)
PULMONARY / CRITICAL CARE MEDICINE   Name: Leah [name] MRN: [id number] DOB: 10/05/1940    ADMISSION DATE:  08/18/2013  [facility] :  [facility]  PRIMARY SERVICE: PCCM  CHIEF COMPLAINT:  Respiratory Failure   BRIEF PATIENT DESCRIPTION: 73 [facility] for planned repair of enterocolonic fistula.  Course was complicated by ileus, renal failure, cardiopulmonary arrest.  [facility].   SIGNIFICANT EVENTS / STUDIES:  2/13  OR >>> Ex-lap with small bowel resection, extensive lysis of adhesions, vomiting 2/22  Cardiac arrest 3/05    TTE: EF 35-40, grade 2 diastolic dysfunction  3/05    CVVHD started  LINES / TUBES: R IJ TLC 2/14 >>> 3/4 ETT 2/22 >>> L IJ TLC 3/4 >>>   CULTURES: ?date [facility] >>> ENTEROCOCCUS FAECALIS 3/4 MRSA PCR >>> neg 3/4 Urine >>>  Multiple bacterial morphotypes  3/4 Blood >>   ANTIBIOTICS: Vancomycin 3/4 >>  Imipenem 3/4 >>   INTERVAL HISTORY:   No new issues  VITAL SIGNS: Temp:  [93.7 F (34.3 C)-97.5 F (36.4 C)] 97.5 F (36.4 C) (03/07 0746) Pulse Rate:  [65-96] 83 (03/07 1223) Resp:  [11-30] 25 (03/07 1223) BP: (88-118)/(42-60) 88/42 mmHg (03/07 1223) SpO2:  [96 %-100 %] 98 % (03/07 1223) FiO2 (%):  [30 %] 30 % (03/07 1223) Weight:  [76.6 kg (168 lb 14 oz)] 76.6 kg (168 lb 14 oz) (03/07 0424)  HEMODYNAMICS:   VENTILATOR SETTINGS: Vent Mode:  [-] PSV FiO2 (%):  [30 %] 30 % Set Rate:  [26 bmp] 26 bmp Vt Set:  [380 mL] 380 mL PEEP:  [5 cmH20] 5 cmH20 Pressure Support:  [8 cmH20-12 cmH20] 12 cmH20 Plateau Pressure:  [20 cmH20-21 cmH20] 20 cmH20  INTAKE / OUTPUT: Intake/Output     03/06 0701 - 03/07 0700 03/07 0701 - 03/08 0700   I.V. (mL/kg) 660.8 (8.6)    IV Piggyback 200    TPN 1470    Total Intake(mL/kg) 2330.8 (30.4)    Urine (mL/kg/hr) 85 (0)    Emesis/NG output 25 (0)    Drains 1545 (0.8)    Other 7734 (4.2) 1742 (3.3)   Stool 325 (0.2)    Total  Output 9714 1742   Net -7383.3 -1742         PHYSICAL EXAMINATION: General:  No distress Neuro:  Comfortable, arouses to stimulation, follows commands HEENT:  OETT / NGT Cardiovascular:  Regular, no murmurs Lungs:  Bilateral scattered rhonchi Abdomen:  LLQ colostomy, RLQ fistula draining serosanguinous fluid Musculoskeletal:  Moves all extremities Skin:  Anasarca  LABS: I have reviewed all of today's lab results. Relevant abnormalities are discussed in the A/P section  CXR: NSC edema pattern ASSESSMENT / PLAN:  PULMONARY A: Acute respiratory failure Pulmonary edema Possible HCAP COPD without exacerbation P:   Cont vent support - adjustments made Cont vent bundle Daily SBT as indicated  CARDIOVASCULAR A:  Septic shock S/p Cardiopulmonary arrest Acute on chronic systolic and diastolic CHF NSTEMI P:  Goal MAP > 65 D/c Neo-Synephrine Titrate Levophed gtt to off BB / ACEI contraindicated as on vasopressors DNR  RENAL A:   AKI Hyperkalemia Volume overload, but fluid status improving neg 1L >>> neg 5L P:   CRRT per Nephrology Trend BMP  GASTROINTESTINAL A:   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  CRITICAL CARE:  The patient is critically ill with multiple organ systems failure and requires high complexity decision making for assessment and support, frequent evaluation and titration of therapies, application of advanced  monitoring technologies and extensive interpretation of multiple databases. Critical Care Time devoted to patient care services described in this note is 35 minutes.   Husband updated @ bedside  Billy Fischer, MD Pulmonary and Critical Care Medicine [facility] Pager: [phone]  08/10/2013, 1:53 PM

## 2013-08-10 NOTE — Progress Notes (Signed)
PARENTERAL NUTRITION CONSULT NOTE:  FOLLOW-UP  Pharmacy Consult:  TPN Indication:  Suspect TF intolerance d/t to severe bowel edema  Allergies  Allergen Reactions  . Cephalexin Other (See Comments)    Unknown  . Codeine Nausea And Vomiting    Patient Measurements: Height: 5\' 1"  (154.9 cm) Weight: 168 lb 14 oz (76.6 kg) IBW/kg (Calculated) : 47.8  Usual Weight: 55 kg (this was admit weight at [facility] 2/13)  Vital Signs: Temp: 97.4 F (36.3 C) (03/07 0423) Temp src: Oral (03/07 0423) BP: 112/48 mmHg (03/07 0600) Pulse Rate: 90 (03/07 0600) Intake/Output from previous day: 03/06 0701 - 03/07 0700 In: 2330.8 [I.V.:660.8; IV Piggyback:200; TPN:1470] Out: 9714 [Urine:85; Emesis/NG output:25; Drains:1545; Stool:325]  Labs:  Recent Labs  08/08/13 0405 08/09/13 0405 08/09/13 1033 08/10/13 0429  WBC 35.9*  --  33.3* 23.9*  HGB 9.0*  --  9.1* 8.1*  HCT 26.7*  --  26.9* 23.8*  PLT 208  --  193 132*  APTT  --  >200*  --  >200*     Recent Labs  08/26/2013 1200 08/06/2013 1648  08/08/13 0405 08/08/13 0450  08/09/13 0500 08/09/13 1830 08/10/13 0429  NA 136* 135*  --  134*  --   < > 133* 135* 133*  K 5.7* 5.7*  --  5.8*  --   < > 4.3 3.8 3.5*  CL 100 100  --  99  --   < > 98 99 97  CO2 19 19  --  19  --   < > 19 23 22   GLUCOSE 116* 111*  --  88  --   < > 112* 156* 208*  BUN 87* 85*  --  91*  --   < > 71* 53* 48*  CREATININE 2.31* 2.29*  --  2.45*  --   < > 1.96* 1.46* 1.24*  CALCIUM 7.9* 7.9*  --  8.2*  --   < > 7.8* 7.6* 7.5*  MG  --   --   --  2.1  --   --  2.2  --  2.1  PHOS  --   --   < > 7.2*  --   --  5.3* 4.4 3.0  PROT 4.6*  --   --  4.7*  --   --   --   --   --   ALBUMIN 2.4*  --   --  2.2*  --   --  2.1* 2.0* 1.9*  AST 446*  --   --  555*  --   --   --   --   --   ALT 38*  --   --  34  --   --   --   --   --   ALKPHOS 102  --   --  101  --   --   --   --   --   BILITOT 1.6*  --   --  1.7*  --   --   --   --   --   PREALBUMIN  --   --   --  15.6*  --   --    --   --   --   TRIG  --   --   --   --  91  --   --   --   --   < > = values in this interval not displayed. Estimated Creatinine Clearance: 39 ml/min (by C-G formula based on  Cr of 1.24).    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 Course complicated by ileus, renal failure, and cardiopulmonary arrest.  [facility].  Pharmacy consulted to manage TPN.  GI: bowels are too edematous to tolerate TF and may trickle feed when volume status improves per Surgery.  No longer suspects EF fistula. Baseline prealbumin was 15.[facility].  TG WNL.  NG + stool O/P improving, drain O/P increasing.  PPI IV Endo: no hx DM - CBGs elevated with TPN.  Cortisol WNL. Lytes: none in TPN - hyponatremia, hypokalemia (K+ >/= 4 for ileus), others WNL Renal: hx CKD started on CRRT on 08/08/13 for volume regulation - SCr/BUN improving, up ~70lbs since admit with diffuse anasarca ([facility], [facility], now at 77kg).  NS at 10 ml/hr.  Minimal UOP.  TPN providing 2 g/kg/day of protein while on CRRT. Pulm: [facility] - FiO2 30% (stable) Cards: AoC dHF (EF 35-40%), [facility]/p [facility] - MAP 60s on norepi, HR normal, NSR - need ACEi/ARB once off pressor Hepatobil: LFTs WNL except for AST at 555; tbili elevated at 1.7 Neuro: Fent gtt - GCS 12, CPOT 0 ID: Vanc/Primaxin for intra-abd infxn + Enterococcus UTI, afebrile, leukocytosis improving Best Practices: PPI IV, SQ heparin, MC TPN Access: CVC placed 08/20/2013 TPN day#: [facility]   Current Nutrition:  None - was on Clinimix 5/15 at 5760ml[facility]hr [facility] - placed on hold 3/3 (started 2/27). Was also on TF at various rates during  [facility] also but didn't tolerate.  Nutritional Goals:  1612 kCal (goal for permissive underfeeding ~1200 kCal); protein 115 gm per day   Plan:  - Continue Clinimix 5/15 (no electrolytes) at 83 ml/hr + lipids at 10 ml/hr on MWF only to minimize kCal provision.  Unfortunately with premixed TPN, will not be able to provide very low kCal and high protein diet but will match as closely as we can.  TPN provides a daily average of 1620 kCal and 100gm protein daily. - TPN also provides ~2L fluid per day.  Renal aware. - Daily IV multivitamin in TPN - Trace elements M/W/F with national shortage - Add 15 units regular insulin to TPN - KCL x 4 runs - F/U daily    Nashanti Duquette D. Laney Potashang, PharmD, BCPS Pager:  [phone] 08/10/2013, 7:23 AM

## 2013-08-10 NOTE — Progress Notes (Signed)
[facility] Progress Note    Subjective: Patient on the ventilator.  Minimally responsive.  On CRT  Objective: Vital signs in last 24 hours: Temp:  [93.7 F (34.3 C)-97.5 F (36.4 C)] 97.5 F (36.4 C) (03/07 0746) Pulse Rate:  [65-96] 90 (03/07 0600) Resp:  [11-30] 26 (03/07 0600) BP: (91-132)/(42-60) 112/48 mmHg (03/07 0600) SpO2:  [96 %-100 %] 97 % (03/07 0600) FiO2 (%):  [30 %] 30 % (03/07 0600) Weight:  [76.6 kg (168 lb 14 oz)] 76.6 kg (168 lb 14 oz) (03/07 0424) Last BM Date: 08/09/13  Intake/Output from previous day: 03/06 0701 - 03/07 0700 In: 2330.8 [I.V.:660.8; IV Piggyback:200; TPN:1470] Out: 9714 [Urine:85; Emesis/NG output:25; Drains:1545; Stool:325] Intake/Output this shift:    General: Stable and sedated  Lungs: Clear  Abd: Total of 900cc of drainage from lower abdominal wound.  Dark, burgundy, drainage total 900cc  Extremities: Edematous  Neuro: Sedated  Lab Results:  @LABLAST2 (wbc:2,hgb:2,hct:2,plt:2) BMET  Recent Labs  08/09/13 1830 08/10/13 0429  NA 135* 133*  K 3.8 3.5*  CL 99 97  CO2 23 22  GLUCOSE 156* 208*  BUN 53* 48*  CREATININE 1.46* 1.24*  CALCIUM 7.6* 7.5*   PT/INR No results found for this basename: LABPROT, INR,  in the last 72 hours ABG  Recent Labs  09/03/2013 1314  PHART 7.315*  HCO3 19.0*    Studies/Results: Dg Chest Port 1 View  08/09/2013   CLINICAL DATA:  Recent central vascular catheter adjustment  EXAM: PORTABLE CHEST - 1 VIEW  COMPARISON:  08/08/2013  FINDINGS: A right-sided dialysis catheter is again noted and stable. The endotracheal tube is seen 3.9 cm above the carina. A left central venous line is noted with the catheter tip in the proximal superior vena cava. This is been withdrawn somewhat in the interval from the prior exam. A nasogastric catheter remains in the stomach. The cardiac shadow is stable. Diffuse vascular congestion and bibasilar airspace disease is noted and unchanged. No new focal abnormality is  seen.  IMPRESSION: Tubes and lines as described.  Stable bibasilar changes.   Electronically Signed   By: Alcide CleverMark  Lukens M.D.   On: 08/09/2013 20:00   Dg Chest Port 1 View  08/08/2013   CLINICAL DATA:  Status post line placement.  EXAM: PORTABLE CHEST - 1 VIEW  COMPARISON:  Same day.  FINDINGS: Endotracheal and nasogastric tubes are unchanged in position. Interval placement of right internal jugular catheter line with distal tip in the expected position of the SVC. No pneumothorax is seen. Stable cardiomegaly. Stable bilateral perihilar and basilar opacity consistent with pulmonary edema or pneumonia. Status post coronary artery bypass graft. Left internal jugular catheter line is unchanged in position.  IMPRESSION: Interval placement of right internal jugular catheter line with distal tip in expected position of the SVC. No pneumothorax is seen. Stable other support apparatus. Continued presence of bilateral lung opacities consistent with pneumonia or edema.   Electronically Signed   By: Roque LiasJames  Green M.D.   On: 08/08/2013 13:27   Dg Chest Port 1 View  08/08/2013   CLINICAL DATA:  Central line placement  EXAM: PORTABLE CHEST - 1 VIEW  COMPARISON:  DG CHEST 1V PORT dated 08/08/2013; DG CHEST 1V PORT dated 08/08/2013  FINDINGS: New right internal jugular vein dialysis catheter with its tip in the upper SVC and no pneumothorax. Tubular device is otherwise stable. Diffuse airspace disease increased at the lung bases. Pleural effusions not excluded. Cardiomegaly.  IMPRESSION: Right internal jugular dialysis catheter placed  with its tip in the SVC and no pneumothorax.  Worsening CHF.   Electronically Signed   By: Maryclare Bean M.D.   On: 08/08/2013 12:08    Anti-infectives: Anti-infectives   Start     Dose/Rate Route Frequency Ordered Stop   08/10/13 0600  imipenem-cilastatin (PRIMAXIN) 250 mg in sodium chloride 0.9 % 100 mL IVPB     250 mg 200 mL/hr over 30 Minutes Intravenous 3 times per day 08/09/13 0021      08/09/13 1000  imipenem-cilastatin (PRIMAXIN) 250 mg in sodium chloride 0.9 % 100 mL IVPB  Status:  Discontinued     250 mg 200 mL/hr over 30 Minutes Intravenous Every 12 hours 08/09/13 0019 08/09/13 0021   08/08/13 1000  micafungin (MYCAMINE) 100 mg in sodium chloride 0.9 % 100 mL IVPB  Status:  Discontinued     100 mg 100 mL/hr over 1 Hours Intravenous Daily 08/08/13 0950 08/08/13 1001   09/02/2013 1800  vancomycin (VANCOCIN) IVPB 1000 mg/200 mL premix     1,000 mg 200 mL/hr over 60 Minutes Intravenous Every 24 hours 08/14/2013 1625     08/21/2013 1700  imipenem-cilastatin (PRIMAXIN) 250 mg in sodium chloride 0.9 % 100 mL IVPB  Status:  Discontinued     250 mg 200 mL/hr over 30 Minutes Intravenous 3 times per day 08/15/2013 1624 08/09/13 0019      Assessment/Plan: s/p  Nothing for [place] to do surgically.  LOS: 3 days   Marta Lamas. Gae Bon, MD, FACS [phone] [facility] Surgery 08/10/2013

## 2013-08-11 ENCOUNTER — Inpatient Hospital Stay (HOSPITAL_COMMUNITY): Payer: Medicare Other

## 2013-08-11 DIAGNOSIS — R609 Edema, unspecified: Secondary | ICD-10-CM

## 2013-08-11 DIAGNOSIS — Z9911 Dependence on respirator [ventilator] status: Secondary | ICD-10-CM

## 2013-08-11 DIAGNOSIS — A419 Sepsis, unspecified organism: Secondary | ICD-10-CM | POA: Diagnosis present

## 2013-08-11 DIAGNOSIS — R6520 Severe sepsis without septic shock: Secondary | ICD-10-CM

## 2013-08-11 DIAGNOSIS — R579 Shock, unspecified: Secondary | ICD-10-CM | POA: Diagnosis present

## 2013-08-11 DIAGNOSIS — J9600 Acute respiratory failure, unspecified whether with hypoxia or hypercapnia: Secondary | ICD-10-CM

## 2013-08-11 DIAGNOSIS — R601 Generalized edema: Secondary | ICD-10-CM | POA: Diagnosis present

## 2013-08-11 LAB — COMPREHENSIVE METABOLIC PANEL
ALT: 86 U/L — AB (ref 0–35)
AST: 899 U/L — AB (ref 0–37)
Albumin: 1.8 g/dL — ABNORMAL LOW (ref 3.5–5.2)
Alkaline Phosphatase: 120 U/L — ABNORMAL HIGH (ref 39–117)
BUN: 40 mg/dL — ABNORMAL HIGH (ref 6–23)
CALCIUM: 7.7 mg/dL — AB (ref 8.4–10.5)
CO2: 22 mEq/L (ref 19–32)
Chloride: 96 mEq/L (ref 96–112)
Creatinine, Ser: 0.84 mg/dL (ref 0.50–1.10)
GFR calc Af Amer: 79 mL/min — ABNORMAL LOW (ref >90)
GFR calc non Af Amer: 68 mL/min — ABNORMAL LOW (ref >90)
Glucose, Bld: 203 mg/dL — ABNORMAL HIGH (ref 70–99)
Potassium: 3.8 mEq/L (ref 3.7–5.3)
SODIUM: 131 meq/L — AB (ref 137–147)
TOTAL PROTEIN: 5 g/dL — AB (ref 6.0–8.3)
Total Bilirubin: 1.9 mg/dL — ABNORMAL HIGH (ref 0.3–1.2)

## 2013-08-11 LAB — RENAL FUNCTION PANEL
Albumin: 1.8 g/dL — ABNORMAL LOW (ref 3.5–5.2)
BUN: 38 mg/dL — AB (ref 6–23)
CALCIUM: 7.8 mg/dL — AB (ref 8.4–10.5)
CO2: 23 meq/L (ref 19–32)
CREATININE: 0.67 mg/dL (ref 0.50–1.10)
Chloride: 93 mEq/L — ABNORMAL LOW (ref 96–112)
GFR calc non Af Amer: 86 mL/min — ABNORMAL LOW (ref >90)
Glucose, Bld: 246 mg/dL — ABNORMAL HIGH (ref 70–99)
PHOSPHORUS: 2.9 mg/dL (ref 2.3–4.6)
Potassium: 4 mEq/L (ref 3.7–5.3)
SODIUM: 126 meq/L — AB (ref 137–147)

## 2013-08-11 LAB — GLUCOSE, CAPILLARY
GLUCOSE-CAPILLARY: 180 mg/dL — AB (ref 70–99)
GLUCOSE-CAPILLARY: 215 mg/dL — AB (ref 70–99)
Glucose-Capillary: 190 mg/dL — ABNORMAL HIGH (ref 70–99)
Glucose-Capillary: 214 mg/dL — ABNORMAL HIGH (ref 70–99)
Glucose-Capillary: 241 mg/dL — ABNORMAL HIGH (ref 70–99)
Glucose-Capillary: 237 mg/dL — ABNORMAL HIGH (ref 70–99)

## 2013-08-11 LAB — CBC
HCT: 25.6 % — ABNORMAL LOW (ref 36.0–46.0)
Hemoglobin: 8.5 g/dL — ABNORMAL LOW (ref 12.0–15.0)
MCH: 29.6 pg (ref 26.0–34.0)
MCHC: 33.2 g/dL (ref 30.0–36.0)
MCV: 89.2 fL (ref 78.0–100.0)
PLATELETS: 120 10*3/uL — AB (ref 150–400)
RBC: 2.87 MIL/uL — ABNORMAL LOW (ref 3.87–5.11)
RDW: 18.7 % — AB (ref 11.5–15.5)
WBC: 30.5 10*3/uL — ABNORMAL HIGH (ref 4.0–10.5)

## 2013-08-11 LAB — PHOSPHORUS: Phosphorus: 2 mg/dL — ABNORMAL LOW (ref 2.3–4.6)

## 2013-08-11 LAB — MAGNESIUM: MAGNESIUM: 2.4 mg/dL (ref 1.5–2.5)

## 2013-08-11 LAB — APTT: aPTT: 56 seconds — ABNORMAL HIGH (ref 24–37)

## 2013-08-11 MED ORDER — SODIUM PHOSPHATE 3 MMOLE/ML IV SOLN
10.0000 mmol | Freq: Once | INTRAVENOUS | Status: AC
  Administered 2013-08-11: 10 mmol via INTRAVENOUS
  Filled 2013-08-11: qty 3.33

## 2013-08-11 MED ORDER — POTASSIUM PHOSPHATE DIBASIC 3 MMOLE/ML IV SOLN
10.0000 mmol | Freq: Once | INTRAVENOUS | Status: DC
  Filled 2013-08-11: qty 3.33

## 2013-08-11 MED ORDER — POTASSIUM CHLORIDE 10 MEQ/50ML IV SOLN
10.0000 meq | INTRAVENOUS | Status: AC
  Administered 2013-08-11 (×2): 10 meq via INTRAVENOUS
  Filled 2013-08-11 (×2): qty 50

## 2013-08-11 MED ORDER — FAT EMULSION 20 % IV EMUL
250.0000 mL | INTRAVENOUS | Status: AC
  Administered 2013-08-11: 250 mL via INTRAVENOUS
  Filled 2013-08-11: qty 250

## 2013-08-11 MED ORDER — CLINIMIX/DEXTROSE (5/15) 5 % IV SOLN
INTRAVENOUS | Status: AC
  Administered 2013-08-11: 18:00:00 via INTRAVENOUS
  Filled 2013-08-11: qty 2000

## 2013-08-11 MED ORDER — POTASSIUM CHLORIDE 10 MEQ/50ML IV SOLN: 10.0000 meq | INTRAVENOUS | Status: DC

## 2013-08-11 NOTE — Procedures (Signed)
Name: Leah FurlongSandra K Guggisberg MRN: [id number] DOB: 04/04/1941  DOS:  PROCEDURE NOTE  Procedure:  Arterial catheter placement.  Indications:  Need for invasive hemodynamic monitoring / frequent arterial blood gases measurement.  Consent:  Consent was implied due to the emergency nature of the procedure.  Procedure summary:  Difficulty with threading wire in left radial artery, right radial site of abg.  The patient was identified as Leah [name] and safety timeout was performed. Collateral arterial flow was confirmed by performing Allen's test. Sterile technique was used. The patient's left axillary area was prepped using chlorhexidine scrub and the field was draped in usual sterile fashion with protective barrier. The left axillary artery was cannulated with some difficulty (difficult to thread wire). Blood was aspirated and the catheter was flushed with normal saline without difficulty. Good arterial waveform was obtained. The catheter was secured into place with sterile dressing.  Complications:  No immediate complications were noted.  Estimated blood loss:  Less then 10 mL  J. Derrel Nipody Mahmood Boehringer, MD Critical Care Medicine [facility]  08/11/2013, 9:10 PM

## 2013-08-11 NOTE — Progress Notes (Addendum)
PULMONARY / CRITICAL CARE MEDICINE   Name: Leah FurlongSandra K Stjulien MRN: [id number] DOB: 09/08/1940    ADMISSION DATE:  08/05/2013  [facility]  PRIMARY SERVICE: PCCM  CHIEF COMPLAINT:  Respiratory Failure   BRIEF PATIENT DESCRIPTION: 73 [facility] planned repair of enterocolonic fistula.  Course was complicated by ileus, renal failure, cardiopulmonary arrest.  [facility].   SIGNIFICANT EVENTS / STUDIES:  2/13  OR >>> Ex-lap with small bowel resection, extensive lysis of adhesions, vomiting 2/22  Cardiac arrest 3/05  TTE: EF 35-40, grade 2 diastolic dysfunction  3/05  CVVHD started  LINES / TUBES: R IJ TLC 2/14 >> 3/04 ETT([place])  2/23 >>  R IJ HD cath 3/04 >>  L IJ TLC 3/04 >>    CULTURES: ?date [facility] >>> ENTEROCOCCUS FAECALIS 3/4 MRSA PCR >>> neg 3/4 Urine >>>  Multiple bacterial morphotypes  3/4 Blood >>   ANTIBIOTICS: Vancomycin 3/4 >> 3/08 Imipenem 3/4 >>   INTERVAL HISTORY:   No new issues. Tolerates PS 10 cm H2O  VITAL SIGNS: Temp:  [94.5 F (34.7 C)-98.2 F (36.8 C)] 97.3 F (36.3 C) (03/08 0724) Pulse Rate:  [77-108] 89 (03/08 0948) Resp:  [24-35] 30 (03/08 0700) BP: (79-125)/(34-65) 116/48 mmHg (03/08 0948) SpO2:  [95 %-100 %] 98 % (03/08 0700) FiO2 (%):  [30 %] 30 % (03/08 0948) Weight:  [69.7 kg (153 lb 10.6 oz)] 69.7 kg (153 lb 10.6 oz) (03/08 0400)  HEMODYNAMICS:   VENTILATOR SETTINGS: Vent Mode:  [-] PSV;CPAP FiO2 (%):  [30 %] 30 % Set Rate:  [16 bmp] 16 bmp Vt Set:  [380 mL] 380 mL PEEP:  [5 cmH20] 5 cmH20 Pressure Support:  [10 cmH20-12 cmH20] 10 cmH20 Plateau Pressure:  [18 cmH20-19 cmH20] 19 cmH20  INTAKE / OUTPUT: Intake/Output     03/07 0701 - 03/08 0700 03/08 0701 - 03/09 0700   I.V. (mL/kg) 609.3 (8.7) 98.9 (1.4)   NG/GT 90    IV Piggyback 700 205   TPN 2139 279   Total Intake(mL/kg) 3538.3 (50.8) 582.9 (8.4)   Urine (mL/kg/hr) 25 (0)    Emesis/NG output 300 (0.2)    Drains 515 (0.3) 15 (0.1)   Other 7257 (4.3) 1287 (5)   Stool 50 (0)    Total Output 8147 1302   Net -4608.8 -719.1         PHYSICAL EXAMINATION: General:  RASS 0, + F/C Neuro: Diffusely weak, no focal deficits HEENT: WNL Cardiovascular:  RRR s M Lungs: clear anteriorly Abdomen:  BS absent, LLQ colostomy, RLQ fistula draining serous fluid Ext:  3-4+ symmetric UE and LE edema  LABS: I have reviewed all of today's lab results. Relevant abnormalities are discussed in the A/P section  CXR: NSC edema pattern  ASSESSMENT / PLAN:  PULMONARY A: Acute respiratory failure Pulmonary edema COPD without exacerbation P:   Cont vent support - adjustments made Cont vent bundle Cont PSV as tolerated Daily SBT as indicated  CARDIOVASCULAR A:  Septic shock Cardiogenic shock S/p Cardiopulmonary arrest Acute on chronic systolic and diastolic CHF NSTEMI 2/23 [facility] P:  Wean NE to off for MAP > 65 mmHg  RENAL A:   AKI, anuric Hyperkalemia, resolved Anasasrca P:   CRRT per Nephrology Monitor BMET intermittently Monitor I/Os Correct electrolytes as indicated  GASTROINTESTINAL A:   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  CRITICAL CARE:  The patient is critically ill with multiple organ systems failure and requires high complexity decision making for assessment  and support, frequent evaluation and titration of therapies, application of advanced monitoring technologies and extensive interpretation of multiple databases. Critical Care Time devoted to patient care services described in this note is 40 minutes.   Husband updated @ bedside  Billy Fischer, MD ; [facility] [phone].  After 5:30 PM or weekends, call [phone]

## 2013-08-11 NOTE — Progress Notes (Signed)
Subjective: On vasopressor for BP.   Objective: Vital signs in last 24 hours: Temp:  [94.5 F (34.7 C)-98.2 F (36.8 C)] 97.3 F (36.3 C) (03/08 0724) Pulse Rate:  [77-108] 89 (03/08 0700) Resp:  [24-35] 30 (03/08 0700) BP: (79-125)/(34-65) 115/49 mmHg (03/08 0645) SpO2:  [95 %-100 %] 98 % (03/08 0700) FiO2 (%):  [30 %] 30 % (03/08 0700) Weight:  [153 lb 10.6 oz (69.7 kg)] 153 lb 10.6 oz (69.7 kg) (03/08 0400) Last BM Date: 08/09/13  Intake/Output from previous day: 03/07 0701 - 03/08 0700 In: 3538.3 [I.V.:609.3; NG/GT:90; IV Piggyback:700; TPN:2139] Out: 8147 [Urine:25; Emesis/NG output:300; Drains:515; Stool:50] Intake/Output this shift:    Intubated. Getting a bath/bedsheets changed abd - soft, some distension. No cellulitis. Ostomy -viable, no stool in bag; lower midline eakin pouch - serosang fluid - about 500cc/24hr  Lab Results:   Recent Labs  08/10/13 0429 08/11/13 0512  WBC 23.9* 30.5*  HGB 8.1* 8.5*  HCT 23.8* 25.6*  PLT 132* 120*   BMET  Recent Labs  08/10/13 1933 08/11/13 0512  NA 128* 131*  K 4.1 3.8  CL 95* 96  CO2 22 22  GLUCOSE 247* 203*  BUN 43* 40*  CREATININE 0.97 0.84  CALCIUM 7.7* 7.7*   PT/INR No results found for this basename: LABPROT, INR,  in the last 72 hours ABG No results found for this basename: PHART, PCO2, PO2, HCO3,  in the last 72 hours  Studies/Results: Dg Chest Port 1 View  08/11/2013   CLINICAL DATA:  Respiratory failure.  EXAM: PORTABLE CHEST - 1 VIEW  COMPARISON:  Chest x-ray 08/10/2013.  FINDINGS: An endotracheal tube is in place with tip 3.6 cm above the carina. There is a left-sided internal jugular central venous catheter with tip terminating in the mid superior vena cava. A right internal jugular Vas-Cath with tip terminating at the superior cavoatrial junction. A nasogastric tube is seen extending into the stomach, however, the tip of the nasogastric tube extends below the lower margin of the image. Lung  volumes are low. Widespread patchy interstitial and airspace disease throughout the lungs bilaterally, which may in part relate to underlying pulmonary edema, although given the asymmetry of the findings a superimposed multilobar pneumonia is suspected. Additional more confluent bibasilar opacities suggest coexistent atelectasis and superimposed moderate bilateral pleural effusions. Heart size appears mildly enlarged. The patient is rotated to the left on today's exam, resulting in distortion of the mediastinal contours and reduced diagnostic sensitivity and specificity for mediastinal pathology. Atherosclerosis in the thoracic aorta. Status post median sternotomy for CABG.  IMPRESSION: 1. Support apparatus, as above. 2. Although the appearance of the chest could certainly reflect some underlying pulmonary edema from congestive heart failure, the widespread asymmetrically distributed interstitial and airspace disease raises concern for potential superimposed multilobar pneumonia. 3. Extensive bibasilar up atelectasis with superimposed moderate bilateral pleural effusions.   Electronically Signed   By: Trudie Reed M.D.   On: 08/11/2013 08:07   Dg Chest Port 1 View  08/10/2013   CLINICAL DATA:  Status post intubation  EXAM: PORTABLE CHEST - 1 VIEW  COMPARISON:  08/09/2013  FINDINGS: The endotracheal tube tip is above the carinal. There is a right IJ catheter with tip in the projection of the SVC. Left sided IJ catheter is noted with tip in the SVC. Previous median sternotomy and CABG procedure. There are bilateral pleural effusions left greater night. Interstitial edema and bilateral airspace opacities are again noted. When compared with previous exam there  is improved aeration to the right base.  IMPRESSION: 1. Persistent CHF pattern. 2. Improved aeration to the right base.   Electronically Signed   By: Signa Kellaylor  Stroud M.D.   On: 08/10/2013 09:12   Dg Chest Port 1 View  08/09/2013   CLINICAL DATA:  Recent  central vascular catheter adjustment  EXAM: PORTABLE CHEST - 1 VIEW  COMPARISON:  08/08/2013  FINDINGS: A right-sided dialysis catheter is again noted and stable. The endotracheal tube is seen 3.9 cm above the carina. A left central venous line is noted with the catheter tip in the proximal superior vena cava. This is been withdrawn somewhat in the interval from the prior exam. A nasogastric catheter remains in the stomach. The cardiac shadow is stable. Diffuse vascular congestion and bibasilar airspace disease is noted and unchanged. No new focal abnormality is seen.  IMPRESSION: Tubes and lines as described.  Stable bibasilar changes.   Electronically Signed   By: Alcide CleverMark  Lukens M.D.   On: 08/09/2013 20:00    Anti-infectives: Anti-infectives   Start     Dose/Rate Route Frequency Ordered Stop   08/10/13 0600  imipenem-cilastatin (PRIMAXIN) 250 mg in sodium chloride 0.9 % 100 mL IVPB     250 mg 200 mL/hr over 30 Minutes Intravenous 3 times per day 08/09/13 0021     08/09/13 1000  imipenem-cilastatin (PRIMAXIN) 250 mg in sodium chloride 0.9 % 100 mL IVPB  Status:  Discontinued     250 mg 200 mL/hr over 30 Minutes Intravenous Every 12 hours 08/09/13 0019 08/09/13 0021   08/08/13 1000  micafungin (MYCAMINE) 100 mg in sodium chloride 0.9 % 100 mL IVPB  Status:  Discontinued     100 mg 100 mL/hr over 1 Hours Intravenous Daily 08/08/13 0950 08/08/13 1001   09/03/2013 1800  vancomycin (VANCOCIN) IVPB 1000 mg/200 mL premix     1,000 mg 200 mL/hr over 60 Minutes Intravenous Every 24 hours 08/11/2013 1625     08/14/2013 1700  imipenem-cilastatin (PRIMAXIN) 250 mg in sodium chloride 0.9 % 100 mL IVPB  Status:  Discontinued     250 mg 200 mL/hr over 30 Minutes Intravenous 3 times per day 08/19/2013 1624 08/09/13 0019      Assessment/Plan: 1. Post op management of abdomen s/p SBR and LOA  2. VDRF  No sign of enteric drainage from midline wound.  Cont NPO, NG tube  Mary SellaEric M. Andrey CampanileWilson, MD, FACS General,  Bariatric, & Minimally Invasive Surgery [facility] [place]   LOS: 4 days    Atilano InaWILSON,Lyam Provencio M 08/11/2013

## 2013-08-11 NOTE — Progress Notes (Signed)
PARENTERAL NUTRITION CONSULT NOTE:  FOLLOW-UP  Pharmacy Consult:  TPN Indication:  Suspect TF intolerance d/t to severe bowel edema  Allergies  Allergen Reactions  . Cephalexin Other (See Comments)    Unknown  . Codeine Nausea And Vomiting    Patient Measurements: Height: 5\' 1"  (154.9 cm) Weight: 153 lb 10.6 oz (69.7 kg) IBW/kg (Calculated) : 47.8  Usual Weight: 55 kg ([facility])  Vital Signs: Temp: 98 F (36.7 C) (03/08 0415) Temp src: Oral (03/08 0415) BP: 112/52 mmHg (03/08 0600) Pulse Rate: 90 (03/08 0600) Intake/Output from previous day: 03/07 0701 - 03/08 0700 In: 3409 [I.V.:573; NG/GT:90; IV Piggyback:700; TPN:2046] Out: 7613 [Urine:25; Emesis/NG output:250; Drains:485; Stool:50]  Labs:  Recent Labs  08/09/13 0405 08/09/13 1033 08/10/13 0429 08/11/13 0512  WBC  --  33.3* 23.9* 30.5*  HGB  --  9.1* 8.1* 8.5*  HCT  --  26.9* 23.8* 25.6*  PLT  --  193 132* 120*  APTT >200*  --  >200* 56*     Recent Labs  08/09/13 0500 08/09/13 1830 08/10/13 0429 08/10/13 1933 08/11/13 0512  NA 133* 135* 133* 128* 131*  K 4.3 3.8 3.5* 4.1 3.8  CL 98 99 97 95* 96  CO2 19 23 22 22 22   GLUCOSE 112* 156* 208* 247* 203*  BUN 71* 53* 48* 43* 40*  CREATININE 1.96* 1.46* 1.24* 0.97 0.84  CALCIUM 7.8* 7.6* 7.5* 7.7* 7.7*  MG 2.2  --  2.1  --  2.4  PHOS 5.3* 4.4 3.0 2.3  --   PROT  --   --   --   --  5.0*  ALBUMIN 2.1* 2.0* 1.9* 1.8* 1.8*  AST  --   --   --   --  899*  ALT  --   --   --   --  86*  ALKPHOS  --   --   --   --  120*  BILITOT  --   --   --   --  1.9*   Estimated Creatinine Clearance: 54.9 ml/min (by C-G formula based on Cr of 0.84).    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 Course complicated by ileus, renal failure, and cardiopulmonary arrest.  [facility] further care.  Pharmacy consulted to manage TPN.  GI: bowels are too edematous to tolerate TF and may trickle feed when volume status improves per Surgery.  Surgery no longer suspects EF fistula. [facility]  TG WNL.  NG O/P increased, drain + stool O/P increasing.  PPI IV Endo: no hx DM - CBGs elevated post TPN initiation; significant SSI use starting 3/7.  Cortisol WNL. Lytes: none in TPN - low Na/Phos, mild hypokalemia (K+ >/= 4 for ileus), others WNL Renal: hx CKD started on CRRT on 08/08/13 for volume regulation - SCr/BUN improving, up 60-70lbs since admit with diffuse anasarca ([facility], [facility], now at 70kg).  NS at 10 ml/hr.  Minimal to no UOP.  TPN providing 2 g/kg/day of protein while on CRRT. Pulm: [facility] - FiO2 30% (stable) Cards: AoC dHF (EF 35-40%), s/p  cardio arrest 2/22 at [facility] - MAP 60-70s on norepi, HR mostly normal, NSR - need ACEi/ARB once off pressor Hepatobil: LFTs/tbili elevated and trending up Neuro: Fent gtt - GCS 12, CPOT 0, RASS -1 ID: Vanc/Primaxin for intra-abd infxn + Enterococcus UTI, afebrile, leukocytosis elevated and increased to 30.5 Best Practices: PPI IV, SQ heparin, MC TPN Access: CVC placed 08/06/2013 TPN day#: PTA from [facility] since 2/27   Current Nutrition:  None - was on Clinimix 5/15 at 16ml/hr at [facility] - placed on hold 3/3 (started 2/27). Was also on TF at various rates during [facility] stay also but didn't tolerate.  Nutritional Goals:  1612 kCal (goal for permissive underfeeding ~1200 kCal); protein 115 gm per day   Plan:  - Continue Clinimix 5/15 (no electrolytes) at 83 ml/hr + lipids at 10 ml/hr on MWF only to minimize kCal provision.  Unfortunately with premixed TPN, will not be able to provide very low kCal and high protein diet but will  match as closely as we can.  TPN provides a daily average of 1620 kCal and 100gm protein daily. - TPN also provides ~2L fluid per day.  Renal aware. - Daily IV multivitamin in TPN - Trace elements M/W/F with national shortage - Increase regular insulin in TPN to 32 units - NaPhos 10 mmol IV x 1 - KCL x 2 runs - F/U AM labs - F/U volume status and initiation of trickle feed   Dawana Asper D. Laney Potash, PharmD, BCPS Pager:  [phone] 08/11/2013, 7:12 AM

## 2013-08-11 NOTE — Progress Notes (Signed)
Arterial line insertion attempted by 2 RT's, but unable to place catheter. RN notified. RT will continue to monitor.

## 2013-08-11 NOTE — Progress Notes (Signed)
eLink Physician-Brief Progress Note Patient Name: Leah FurlongSandra K [name] DOB: 09/09/1940 MRN: [id number]  Date of Service  08/11/2013   HPI/Events of Note   Increased Levophed requirements Hypervolemia   eICU Interventions   Titrate levophed up A-line ABG    Intervention Category Major Interventions: Hypotension - evaluation and management;Shock - evaluation and management  Kino Dunsworth 08/11/2013, 6:04 PM

## 2013-08-11 NOTE — Progress Notes (Signed)
Pt back on full support due to drop in BP. RT will continue to monitor.

## 2013-08-11 NOTE — Procedures (Signed)
Admit: 08/20/2013 LOS: 4  53F AKI in setting of small bowel resectiion / LOA for EC fistula c/b cardiac arrest and VDRF with volume overload, acidosis, and hyperkalemia.   Current CRRT Prescription: Start Date: 08/08/13   Catheter: R IJ BFR: 250 Pre Blood Pump: 200 DFR: 1000 Replacement Rate: 200 Goal UF: -200/h Anticoagulation: no Clotting: none   S: Cont with good UF and net neg > 4L yesterday.  Goal neg 200/h currently on 7 NE Heparin off 2/2 red tinged UF, still not clotting Tolerating PSV, more awake this AM Stable doses of NE, with some lability over past 24h  O: 03/07 0701 - 03/08 0700 In: 3538.3 [I.V.:609.3; NG/GT:90; IV Piggyback:700; TPN:2139] Out: 8147 [Urine:25; Emesis/NG output:300; Drains:515; Stool:50]  Filed Weights   08/09/13 0500 08/10/13 0424 08/11/13 0400  Weight: 85.5 kg (188 lb 7.9 oz) 76.6 kg (168 lb 14 oz) 69.7 kg (153 lb 10.6 oz)     Recent Labs Lab 08/10/13 0429 08/10/13 1933 08/11/13 0512 08/11/13 0607  NA 133* 128* 131*  --   K 3.5* 4.1 3.8  --   CL 97 95* 96  --   CO2 22 22 22   --   GLUCOSE 208* 247* 203*  --   BUN 48* 43* 40*  --   CREATININE 1.24* 0.97 0.84  --   CALCIUM 7.5* 7.7* 7.7*  --   PHOS 3.0 2.3  --  2.0*    Recent Labs Lab 08/06/2013 1200 08/08/13 0405 08/09/13 1033 08/10/13 0429 08/11/13 0512  WBC 34.0* 35.9* 33.3* 23.9* 30.5*  NEUTROABS  --  33.4*  --   --   --   HGB 9.3* 9.0* 9.1* 8.1* 8.5*  HCT 26.3* 26.7* 26.9* 23.8* 25.6*  MCV 85.9 87.0 87.9 89.1 89.2  PLT 206 208 193 132* 120*    ABG    Component Value Date/Time   PHART 7.315* 08/17/2013 1314   PCO2ART 37.3 09/03/2013 1314   PO2ART 67.0* 08/19/2013 1314   HCO3 19.0* 08/26/2013 1314   TCO2 20 08/16/2013 1314   ACIDBASEDEF 7.0* 08/06/2013 1314   O2SAT 92.0 08/04/2013 1314    Plan:  1. Dialysis dependent AKI: Will cont for 24h more of CRRT but do not want to inc pressor req to achieve UF goals.  By report was 60lb above baseline weight upon admission and weight  down 35lb since CRRT started.  Tentatively to come off CRRT tomorrow and then assess if renal recovery present or req IHD.  Phos repleted by pharmacy with TPN.  Other electrolytes stable.  Transfusion / anemia mgmt per CCM.     Sabra Heckyan Margia Wiesen, MD [facility] pgr [phone]

## 2013-08-12 ENCOUNTER — Inpatient Hospital Stay (HOSPITAL_COMMUNITY): Payer: Medicare Other

## 2013-08-12 DIAGNOSIS — R6520 Severe sepsis without septic shock: Secondary | ICD-10-CM

## 2013-08-12 DIAGNOSIS — A419 Sepsis, unspecified organism: Secondary | ICD-10-CM

## 2013-08-12 DIAGNOSIS — R579 Shock, unspecified: Secondary | ICD-10-CM

## 2013-08-12 LAB — COMPREHENSIVE METABOLIC PANEL
ALK PHOS: 119 U/L — AB (ref 39–117)
ALT: 120 U/L — AB (ref 0–35)
AST: 714 U/L — ABNORMAL HIGH (ref 0–37)
Albumin: 1.7 g/dL — ABNORMAL LOW (ref 3.5–5.2)
BILIRUBIN TOTAL: 1.9 mg/dL — AB (ref 0.3–1.2)
BUN: 38 mg/dL — AB (ref 6–23)
CHLORIDE: 95 meq/L — AB (ref 96–112)
CO2: 21 mEq/L (ref 19–32)
Calcium: 7.7 mg/dL — ABNORMAL LOW (ref 8.4–10.5)
Creatinine, Ser: 0.58 mg/dL (ref 0.50–1.10)
GFR calc non Af Amer: >90 mL/min (ref >90)
GLUCOSE: 227 mg/dL — AB (ref 70–99)
Potassium: 3.4 mEq/L — ABNORMAL LOW (ref 3.7–5.3)
Sodium: 129 mEq/L — ABNORMAL LOW (ref 137–147)
TOTAL PROTEIN: 4.8 g/dL — AB (ref 6.0–8.3)

## 2013-08-12 LAB — DIFFERENTIAL
BASOS ABS: 0 10*3/uL (ref 0.0–0.1)
Basophils Relative: 0 % (ref 0–1)
Eosinophils Absolute: 0.6 10*3/uL (ref 0.0–0.7)
Eosinophils Relative: 2 % (ref 0–5)
LYMPHS ABS: 1.5 10*3/uL (ref 0.7–4.0)
LYMPHS PCT: 5 % — AB (ref 12–46)
Monocytes Absolute: 1.5 10*3/uL — ABNORMAL HIGH (ref 0.1–1.0)
Monocytes Relative: 5 % (ref 3–12)
NEUTROS ABS: 25.9 10*3/uL — AB (ref 1.7–7.7)
Neutrophils Relative %: 88 % — ABNORMAL HIGH (ref 43–77)

## 2013-08-12 LAB — RENAL FUNCTION PANEL
Albumin: 1.5 g/dL — ABNORMAL LOW (ref 3.5–5.2)
BUN: 37 mg/dL — ABNORMAL HIGH (ref 6–23)
CO2: 21 meq/L (ref 19–32)
Calcium: 7.6 mg/dL — ABNORMAL LOW (ref 8.4–10.5)
Chloride: 94 mEq/L — ABNORMAL LOW (ref 96–112)
Creatinine, Ser: 0.54 mg/dL (ref 0.50–1.10)
GFR calc non Af Amer: >90 mL/min (ref >90)
Glucose, Bld: 238 mg/dL — ABNORMAL HIGH (ref 70–99)
PHOSPHORUS: 2.6 mg/dL (ref 2.3–4.6)
POTASSIUM: 4.5 meq/L (ref 3.7–5.3)
SODIUM: 127 meq/L — AB (ref 137–147)

## 2013-08-12 LAB — BLOOD GAS, ARTERIAL
Acid-base deficit: 4.4 mmol/L — ABNORMAL HIGH (ref 0.0–2.0)
Acid-base deficit: 2.2 mmol/L — ABNORMAL HIGH (ref 0.0–2.0)
Bicarbonate: 22.1 mEq/L (ref 20.0–24.0)
Bicarbonate: 22 mEq/L (ref 20.0–24.0)
DRAWN BY: 330991
DRAWN BY: 24513
FIO2: 0.3 %
FIO2: 30 %
LHR: 16 {breaths}/min
LHR: 16 {breaths}/min
MECHVT: 380 mL
O2 SAT: 95.6 %
O2 Saturation: 97 %
PCO2 ART: 53.9 mmHg — AB (ref 35.0–45.0)
PCO2 ART: 36.8 mmHg (ref 35.0–45.0)
PEEP/CPAP: 5 cmH2O
PEEP: 5 cmH2O
PH ART: 7.233 — AB (ref 7.350–7.450)
PH ART: 7.392 (ref 7.350–7.450)
PO2 ART: 73.9 mmHg — AB (ref 80.0–100.0)
Patient temperature: 97.5
Patient temperature: 97.6
TCO2: 23.8 mmol/L (ref 0–100)
TCO2: 23.2 mmol/L (ref 0–100)
VT: 380 mL
pO2, Arterial: 96.8 mmHg (ref 80.0–100.0)

## 2013-08-12 LAB — CBC
HCT: 24 % — ABNORMAL LOW (ref 36.0–46.0)
Hemoglobin: 8.1 g/dL — ABNORMAL LOW (ref 12.0–15.0)
MCH: 30.8 pg (ref 26.0–34.0)
MCHC: 33.8 g/dL (ref 30.0–36.0)
MCV: 91.3 fL (ref 78.0–100.0)
PLATELETS: 99 10*3/uL — AB (ref 150–400)
RBC: 2.63 MIL/uL — ABNORMAL LOW (ref 3.87–5.11)
RDW: 19.2 % — AB (ref 11.5–15.5)
WBC: 29.5 10*3/uL — AB (ref 4.0–10.5)

## 2013-08-12 LAB — GLUCOSE, CAPILLARY
GLUCOSE-CAPILLARY: 205 mg/dL — AB (ref 70–99)
GLUCOSE-CAPILLARY: 231 mg/dL — AB (ref 70–99)
GLUCOSE-CAPILLARY: 247 mg/dL — AB (ref 70–99)
GLUCOSE-CAPILLARY: 261 mg/dL — AB (ref 70–99)
Glucose-Capillary: 205 mg/dL — ABNORMAL HIGH (ref 70–99)
Glucose-Capillary: 237 mg/dL — ABNORMAL HIGH (ref 70–99)

## 2013-08-12 LAB — TRIGLYCERIDES: Triglycerides: 138 mg/dL (ref <150)

## 2013-08-12 LAB — PREALBUMIN: Prealbumin: 12.3 mg/dL — ABNORMAL LOW (ref 17.0–34.0)

## 2013-08-12 LAB — MAGNESIUM: Magnesium: 2.1 mg/dL (ref 1.5–2.5)

## 2013-08-12 LAB — PHOSPHORUS: Phosphorus: 1.5 mg/dL — ABNORMAL LOW (ref 2.3–4.6)

## 2013-08-12 LAB — APTT: aPTT: 50 seconds — ABNORMAL HIGH (ref 24–37)

## 2013-08-12 MED ORDER — WHITE PETROLATUM GEL
Status: AC
  Administered 2013-08-12: 11:00:00
  Filled 2013-08-12: qty 5

## 2013-08-12 MED ORDER — TRACE MINERALS CR-CU-F-FE-I-MN-MO-SE-ZN IV SOLN
INTRAVENOUS | Status: AC
  Administered 2013-08-12: 17:00:00 via INTRAVENOUS
  Filled 2013-08-12: qty 2000

## 2013-08-12 MED ORDER — POTASSIUM PHOSPHATE DIBASIC 3 MMOLE/ML IV SOLN
20.0000 mmol | Freq: Once | INTRAVENOUS | Status: AC
  Administered 2013-08-12: 20 mmol via INTRAVENOUS
  Filled 2013-08-12: qty 6.67

## 2013-08-12 MED ORDER — FAT EMULSION 20 % IV EMUL
250.0000 mL | INTRAVENOUS | Status: AC
  Administered 2013-08-12: 250 mL via INTRAVENOUS
  Filled 2013-08-12: qty 250

## 2013-08-12 MED ORDER — FENTANYL CITRATE 0.05 MG/ML IJ SOLN
12.5000 ug | INTRAMUSCULAR | Status: DC | PRN
  Administered 2013-08-13: 25 ug via INTRAVENOUS
  Filled 2013-08-12: qty 2

## 2013-08-12 MED ORDER — PRISMASOL BGK 4/2.5 32-4-2.5 MEQ/L IV SOLN
INTRAVENOUS | Status: DC
  Administered 2013-08-12 – 2013-08-22 (×11): via INTRAVENOUS_CENTRAL
  Filled 2013-08-12 (×13): qty 5000

## 2013-08-12 MED ORDER — POTASSIUM CHLORIDE 10 MEQ/50ML IV SOLN
10.0000 meq | INTRAVENOUS | Status: AC
  Administered 2013-08-12 (×3): 10 meq via INTRAVENOUS
  Filled 2013-08-12 (×3): qty 50

## 2013-08-12 MED ORDER — PRISMASOL BGK 4/2.5 32-4-2.5 MEQ/L IV SOLN
INTRAVENOUS | Status: DC
  Administered 2013-08-12 – 2013-08-22 (×10): via INTRAVENOUS_CENTRAL
  Filled 2013-08-12 (×13): qty 5000

## 2013-08-12 NOTE — Progress Notes (Signed)
Inpatient Diabetes Program Recommendations  AACE/ADA: New Consensus Statement on Inpatient Glycemic Control (2013)  Target Ranges:  Prepandial:   less than 140 mg/dL      Peak postprandial:   less than 180 mg/dL (1-2 hours)      Critically ill patients:  140 - 180 mg/dL   Reason for Visit: Note CBG's elevated with TNA.  No history of diabetes.  Note that insulin increased in TNA.  If CBG's continue to be elevated, may consider IV insulin or adding low dose basal insulin such as Levemir 10 units daily.    Beryl MeagerJenny Arlen Legendre, RN, BC-ADM Inpatient Diabetes Coordinator Pager [phone]

## 2013-08-12 NOTE — Progress Notes (Signed)
PULMONARY / CRITICAL CARE MEDICINE   Name: Leah [name] MRN: [id number] DOB: May 05, 1941    ADMISSION DATE:  2013/08/13  REFERRING MD :  [facility]  PRIMARY SERVICE: PCCM  CHIEF COMPLAINT:  Respiratory Failure   BRIEF PATIENT DESCRIPTION: 73 y/o admitted to [facility] for planned repair of enterocolonic fistula.  Course was complicated by ileus, renal failure, cardiopulmonary arrest.  Transferred to [facility] on 3/4 for further care.   LINES / TUBES: R IJ TLC 2/14 >> 3/04 ETT([place])  2/23 >>  R IJ HD cath 3/04 >>  L IJ TLC 3/04 >>    CULTURES: ?date [facility]) >>> ENTEROCOCCUS FAECALIS 3/4 MRSA PCR >>> neg 3/4 Urine >>>  Multiple bacterial morphotypes  3/4 Blood >>   ANTIBIOTICS: Vancomycin 3/4 >> 3/08 Imipenem 3/4 >>   SIGNIFICANT EVENTS / STUDIES:  2/13  OR >>> Ex-lap with small bowel resection, extensive lysis of adhesions, vomiting 2/22  Cardiac arrest - VDRF/Intubated August 13, 2013 - ADMIT [facility] 3/05  TTE: EF 35-40, grade 2 diastolic dysfunction  3/05  CVVHD started 3/8 - No new issues. Tolerates PS 10 cm H2O   SUBJECTIVE/OVERNIGHT/INTERVAL HX 08/12/13: 40# off since [facility] with CR[facility]. Still anuric. Patient is DNAR but full medical care. On levophed .. Family at bedside  VITAL SIGNS: Temp:  [96.1 F (35.6 C)-98.5 F (36.9 C)] 97.9 F (36.6 C) (03/09 0802) Pulse Rate:  [56-117] 117 (03/09 0808) Resp:  [26-37] 32 (03/09 0808) BP: (61-138)/(38-69) 138/64 mmHg (03/09 0808) SpO2:  [95 %-100 %] 100 % (03/09 0808) Arterial Line BP: (91-124)/(39-54) 108/43 mmHg (03/09 0700) FiO2 (%):  [30 %] 30 % (03/09 0809) Weight:  [67.7 kg (149 lb 4 oz)] 67.7 kg (149 lb 4 oz) (03/09 0400)  HEMODYNAMICS:   VENTILATOR SETTINGS: Vent Mode:  [-] PRVC FiO2 (%):  [30 %] 30 % Set Rate:  [16 bmp] 16 bmp Vt Set:  [380 mL] 380 mL PEEP:  [5 cmH20] 5 cmH20 Pressure Support:  [10 cmH20] 10 cmH20 Plateau Pressure:  [18 cmH20-24 cmH20] 24 cmH20  INTAKE /  OUTPUT: Intake/Output     03/08 0701 - 03/09 0700 03/09 0701 - 03/10 0700   I.V. (mL/kg) 1175.7 (17.4)    NG/GT 90    IV Piggyback 489    TPN 2232    Total Intake(mL/kg) 3986.7 (58.9)    Urine (mL/kg/hr) 15 (0)    Emesis/NG output 200 (0.1)    Drains 290 (0.2)    Other 4954 (3) 301 (1.8)   Stool     Total Output 5459 301   Net -1472.3 -301         PHYSICAL EXAMINATION: General:  Very deconditioned. Critically ill looking Neuro: Diffusely weak, no focal deficits, RASS -3 HEENT: WNL Cardiovascular:  RRR s M Lungs: clear anteriorly Abdomen:  BS absent, LLQ colostomy, RLQ fistula draining serous fluid Ext:  3-4+ symmetric UE and LE edema   PULMONARY  Recent Labs Lab 08-13-2013 1314 08/11/13 1827 08/12/13 0325  PHART 7.315* 7.233* 7.392  PCO2ART 37.3 53.9* 36.8  PO2ART 67.0* 96.8 73.9*  HCO3 19.0* 22.1 22.0  TCO2 20 23.8 23.2  O2SAT 92.0 97.0 95.6    CBC  Recent Labs Lab 08/10/13 0429 08/11/13 0512 08/12/13 0505  HGB 8.1* 8.5* 8.1*  HCT 23.8* 25.6* 24.0*  WBC 23.9* 30.5* 29.5*  PLT 132* 120* 99*    COAGULATION No results found for this basename: INR,  in the last 168 hours  CARDIAC  No  results found for this basename: TROPONINI,  in the last 168 hours No results found for this basename: PROBNP,  in the last 168 hours   CHEMISTRY  Recent Labs Lab 08/08/13 0405  08/09/13 0500  08/10/13 0429 08/10/13 1933 08/11/13 0512 08/11/13 0607 08/11/13 1658 08/12/13 0505  NA 134*  < > 133*  < > 133* 128* 131*  --  126* 129*  K 5.8*  < > 4.3  < > 3.5* 4.1 3.8  --  4.0 3.4*  CL 99  < > 98  < > 97 95* 96  --  93* 95*  CO2 19  < > 19  < > 22 22 22   --  23 21  GLUCOSE 88  < > 112*  < > 208* 247* 203*  --  246* 227*  BUN 91*  < > 71*  < > 48* 43* 40*  --  38* 38*  CREATININE 2.45*  < > 1.96*  < > 1.24* 0.97 0.84  --  0.67 0.58  CALCIUM 8.2*  < > 7.8*  < > 7.5* 7.7* 7.7*  --  7.8* 7.7*  MG 2.1  --  2.2  --  2.1  --  2.4  --   --  2.1  PHOS 7.2*  --  5.3*  < >  3.0 2.3  --  2.0* 2.9 1.5*  < > = values in this interval not displayed. Estimated Creatinine Clearance: 56.8 ml/min (by C-G formula based on Cr of 0.58).   LIVER  Recent Labs Lab 08/11/2013 1200 08/08/13 0405  08/10/13 0429 08/10/13 1933 08/11/13 0512 08/11/13 1658 08/12/13 0505  AST 446* 555*  --   --   --  899*  --  714*  ALT 38* 34  --   --   --  86*  --  120*  ALKPHOS 102 101  --   --   --  120*  --  119*  BILITOT 1.6* 1.7*  --   --   --  1.9*  --  1.9*  PROT 4.6* 4.7*  --   --   --  5.0*  --  4.8*  ALBUMIN 2.4* 2.2*  < > 1.9* 1.8* 1.8* 1.8* 1.7*  < > = values in this interval not displayed.   INFECTIOUS No results found for this basename: LATICACIDVEN, PROCALCITON,  in the last 168 hours   ENDOCRINE CBG (last 3)   Recent Labs  08/11/13 2334 08/12/13 0352 08/12/13 0752  GLUCAP 231* 205* 205*         IMAGING x48h  Dg Chest Port 1 View  08/12/2013   CLINICAL DATA:  Follow-up respiratory failure.  EXAM: PORTABLE CHEST - 1 VIEW  COMPARISON:  08/11/2013  FINDINGS: Endotracheal tube is 3.9 cm above the carina. Nasogastric tube extends into the abdomen. Bilateral jugular central venous catheters are in the SVC region. Heart size is grossly stable. Again noted are patchy interstitial and airspace densities bilaterally. Airspace density has mildly improved at the lung bases. Negative for a pneumothorax.  IMPRESSION: Slightly decreased airspace disease may reflect decreasing edema.  Support apparatuses as described.   Electronically Signed   By: Richarda Overlie M.D.   On: 08/12/2013 07:39   Dg Chest Port 1 View  08/11/2013   CLINICAL DATA:  Respiratory failure.  EXAM: PORTABLE CHEST - 1 VIEW  COMPARISON:  Chest x-ray 08/10/2013.  FINDINGS: An endotracheal tube is in place with tip 3.6 cm above the carina. There is a left-sided  internal jugular central venous catheter with tip terminating in the mid superior vena cava. A right internal jugular Vas-Cath with tip terminating at the  superior cavoatrial junction. A nasogastric tube is seen extending into the stomach, however, the tip of the nasogastric tube extends below the lower margin of the image. Lung volumes are low. Widespread patchy interstitial and airspace disease throughout the lungs bilaterally, which may in part relate to underlying pulmonary edema, although given the asymmetry of the findings a superimposed multilobar pneumonia is suspected. Additional more confluent bibasilar opacities suggest coexistent atelectasis and superimposed moderate bilateral pleural effusions. Heart size appears mildly enlarged. The patient is rotated to the left on today's exam, resulting in distortion of the mediastinal contours and reduced diagnostic sensitivity and specificity for mediastinal pathology. Atherosclerosis in the thoracic aorta. Status post median sternotomy for CABG.  IMPRESSION: 1. Support apparatus, as above. 2. Although the appearance of the chest could certainly reflect some underlying pulmonary edema from congestive heart failure, the widespread asymmetrically distributed interstitial and airspace disease raises concern for potential superimposed multilobar pneumonia. 3. Extensive bibasilar up atelectasis with superimposed moderate bilateral pleural effusions.   Electronically Signed   By: Trudie Reedaniel  Entrikin M.D.   On: 08/11/2013 08:07      ASSESSMENT / PLAN:  PULMONARY A: Acute respiratory failure Pulmonary edema COPD without exacerbation   - Failure to wean due to severe physical deconditioning P:   Full vent support SBT as tolerated Needs trach; discussed indication with husband and sister in law  CARDIOVASCULAR A:  Septic shock Cardiogenic shock S/p Cardiopulmonary arrest Acute on chronic systolic and diastolic CHF NSTEMI 2/23 [facility]([place])   - still on pressors  P:  Wean NE to off for MAP > 65 mmHg  RENAL A:   AKI, anuric Hyperkalemia, resolved Anasasrca   - still anuric. 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 Explained chronic critical illness, need for trach, LTAC and prognosis of weeks to months for recovery to home. He is very surprised. Wants to talk to care manager. He is currently too over whelmed to make any decision about trach  I have personally obtained history, examined patient, evaluated and interpreted laboratory and imaging results, reviewed medical records, formulated assessment / plan and placed orders.  CRITICAL CARE:  The patient is critically ill with multiple organ systems failure and requires high complexity decision making for assessment and support, frequent evaluation and titration of therapies, application of advanced monitoring technologies and extensive interpretation of multiple databases. Critical Care Time devoted to patient care services described in this note is 40 minutes.   Dr. Kalman ShanMurali Kelwin Gibler, M.D., [facility] Pulmonary and Critical Care Medicine Staff Physician [facility] [place] [facility] Pager: [phone], If no answer or between  15:00h - 7:00h: call [phone]  [phone]  08/12/2013 9:53 AM

## 2013-08-12 NOTE — Progress Notes (Signed)
Subjective: Intubated. No acute changes  Objective: Vital signs in last 24 hours: Temp:  [96.1 F (35.6 C)-98.5 F (36.9 C)] 97.9 F (36.6 C) (03/09 0802) Pulse Rate:  [56-117] 117 (03/09 0808) Resp:  [26-37] 32 (03/09 0808) BP: (61-138)/(38-69) 138/64 mmHg (03/09 0808) SpO2:  [95 %-100 %] 100 % (03/09 0808) Arterial Line BP: (91-124)/(39-54) 108/43 mmHg (03/09 0700) FiO2 (%):  [30 %] 30 % (03/09 0808) Weight:  [149 lb 4 oz (67.7 kg)] 149 lb 4 oz (67.7 kg) (03/09 0400) Last BM Date: 08/09/13  Intake/Output from previous day: 03/08 0701 - 03/09 0700 In: 3986.7 [I.V.:1175.7; NG/GT:90; IV Piggyback:489; TPN:2232] Out: 5459 [Urine:15; Emesis/NG output:200; Drains:290] Intake/Output this shift: Total I/O In: -  Out: 156 [Other:156]  Abdomen soft Ostomy pink, viable Lower mid-line drainage dark, almost like old hematoma, non purulent, not gross stool  Lab Results:   Recent Labs  08/11/13 0512 08/12/13 0505  WBC 30.5* 29.5*  HGB 8.5* 8.1*  HCT 25.6* 24.0*  PLT 120* 99*   BMET  Recent Labs  08/11/13 1658 08/12/13 0505  NA 126* 129*  K 4.0 3.4*  CL 93* 95*  CO2 23 21  GLUCOSE 246* 227*  BUN 38* 38*  CREATININE 0.67 0.58  CALCIUM 7.8* 7.7*   PT/INR No results found for this basename: LABPROT, INR,  in the last 72 hours ABG  Recent Labs  08/11/13 1827 08/12/13 0325  PHART 7.233* 7.392  HCO3 22.1 22.0    Studies/Results: Dg Chest Port 1 View  08/12/2013   CLINICAL DATA:  Follow-up respiratory failure.  EXAM: PORTABLE CHEST - 1 VIEW  COMPARISON:  08/11/2013  FINDINGS: Endotracheal tube is 3.9 cm above the carina. Nasogastric tube extends into the abdomen. Bilateral jugular central venous catheters are in the SVC region. Heart size is grossly stable. Again noted are patchy interstitial and airspace densities bilaterally. Airspace density has mildly improved at the lung bases. Negative for a pneumothorax.  IMPRESSION: Slightly decreased airspace disease may  reflect decreasing edema.  Support apparatuses as described.   Electronically Signed   By: Richarda Overlie M.D.   On: 08/12/2013 07:39   Dg Chest Port 1 View  08/11/2013   CLINICAL DATA:  Respiratory failure.  EXAM: PORTABLE CHEST - 1 VIEW  COMPARISON:  Chest x-ray 08/10/2013.  FINDINGS: An endotracheal tube is in place with tip 3.6 cm above the carina. There is a left-sided internal jugular central venous catheter with tip terminating in the mid superior vena cava. A right internal jugular Vas-Cath with tip terminating at the superior cavoatrial junction. A nasogastric tube is seen extending into the stomach, however, the tip of the nasogastric tube extends below the lower margin of the image. Lung volumes are low. Widespread patchy interstitial and airspace disease throughout the lungs bilaterally, which may in part relate to underlying pulmonary edema, although given the asymmetry of the findings a superimposed multilobar pneumonia is suspected. Additional more confluent bibasilar opacities suggest coexistent atelectasis and superimposed moderate bilateral pleural effusions. Heart size appears mildly enlarged. The patient is rotated to the left on today's exam, resulting in distortion of the mediastinal contours and reduced diagnostic sensitivity and specificity for mediastinal pathology. Atherosclerosis in the thoracic aorta. Status post median sternotomy for CABG.  IMPRESSION: 1. Support apparatus, as above. 2. Although the appearance of the chest could certainly reflect some underlying pulmonary edema from congestive heart failure, the widespread asymmetrically distributed interstitial and airspace disease raises concern for potential superimposed multilobar pneumonia. 3. Extensive bibasilar  up atelectasis with superimposed moderate bilateral pleural effusions.   Electronically Signed   By: Trudie Reedaniel  Entrikin M.D.   On: 08/11/2013 08:07    Anti-infectives: Anti-infectives   Start     Dose/Rate Route Frequency  Ordered Stop   08/10/13 0600  imipenem-cilastatin (PRIMAXIN) 250 mg in sodium chloride 0.9 % 100 mL IVPB     250 mg 200 mL/hr over 30 Minutes Intravenous 3 times per day 08/09/13 0021     08/09/13 1000  imipenem-cilastatin (PRIMAXIN) 250 mg in sodium chloride 0.9 % 100 mL IVPB  Status:  Discontinued     250 mg 200 mL/hr over 30 Minutes Intravenous Every 12 hours 08/09/13 0019 08/09/13 0021   08/08/13 1000  micafungin (MYCAMINE) 100 mg in sodium chloride 0.9 % 100 mL IVPB  Status:  Discontinued     100 mg 100 mL/hr over 1 Hours Intravenous Daily 08/08/13 0950 08/08/13 1001   08/06/2013 1800  vancomycin (VANCOCIN) IVPB 1000 mg/200 mL premix  Status:  Discontinued     1,000 mg 200 mL/hr over 60 Minutes Intravenous Every 24 hours 08/24/2013 1625 08/11/13 1057   08/15/2013 1700  imipenem-cilastatin (PRIMAXIN) 250 mg in sodium chloride 0.9 % 100 mL IVPB  Status:  Discontinued     250 mg 200 mL/hr over 30 Minutes Intravenous 3 times per day 08/10/2013 1624 08/09/13 0019      Assessment/Plan:  Ostomy, wound drainage  Continuing current wound care.  May need to check a CT later if drain nature changes further  LOS: 5 days    Sujey Gundry A 08/12/2013

## 2013-08-12 NOTE — Progress Notes (Signed)
PARENTERAL NUTRITION CONSULT NOTE:  FOLLOW-UP  Pharmacy Consult:  TPN Indication: Suspect TF intolerance d/t to severe bowel edema  Allergies  Allergen Reactions  . Cephalexin Other (See Comments)    Unknown  . Codeine Nausea And Vomiting    Patient Measurements: Height: 5\' 1"  (154.9 cm) Weight: 149 lb 4 oz (67.7 kg) IBW/kg (Calculated) : 47.8  Usual Weight: 55 kg ([facility])  Vital Signs: Temp: 97.9 F (36.6 C) (03/09 0802) Temp src: Oral (03/09 0802) BP: 138/64 mmHg (03/09 0808) Pulse Rate: 117 (03/09 0808) Intake/Output from previous day: 03/08 0701 - 03/09 0700 In: 3986.7 [I.V.:1175.7; NG/GT:90; IV Piggyback:489; TPN:2232] Out: 5459 [Urine:15; Emesis/NG output:200; Drains:290]  Labs:  Recent Labs  08/10/13 0429 08/11/13 0512 08/12/13 0505  WBC 23.9* 30.5* 29.5*  HGB 8.1* 8.5* 8.1*  HCT 23.8* 25.6* 24.0*  PLT 132* 120* 99*  APTT >200* 56* 50*     Recent Labs  08/10/13 0429  08/11/13 0512 08/11/13 0607 08/11/13 1658 08/12/13 0505  NA 133*  < > 131*  --  126* 129*  K 3.5*  < > 3.8  --  4.0 3.4*  CL 97  < > 96  --  93* 95*  CO2 22  < > 22  --  23 21  GLUCOSE 208*  < > 203*  --  246* 227*  BUN 48*  < > 40*  --  38* 38*  CREATININE 1.24*  < > 0.84  --  0.67 0.58  CALCIUM 7.5*  < > 7.7*  --  7.8* 7.7*  MG 2.1  --  2.4  --   --  2.1  PHOS 3.0  < >  --  2.0* 2.9 1.5*  PROT  --   --  5.0*  --   --  4.8*  ALBUMIN 1.9*  < > 1.8*  --  1.8* 1.7*  AST  --   --  899*  --   --  714*  ALT  --   --  86*  --   --  120*  ALKPHOS  --   --  120*  --   --  119*  BILITOT  --   --  1.9*  --   --  1.9*  TRIG  --   --   --   --   --  138  < > = values in this interval not displayed. Estimated Creatinine Clearance: 56.8 ml/min (by C-G formula based on Cr of 0.58).    Recent Labs  08/11/13 2334 08/12/13 0352 08/12/13 0752  GLUCAP 231* 205* 205*     Insulin Requirements in the past 24 hours:  33 units moderate SSI + 32 units regular  insulin in TPN with CBGs 180-241 last 24h  Assessment: [age] YOF [facility] for planned repair of enterocolonic fistula. Post-op course complicated by ileus, renal failure, and cardiopulmonary arrest.  [facility].  GI: bowels are too edematous to tolerate TF and may trickle feed when volume status improves per Surgery. Surgery no longer suspects EF fistula. Baseline prealbumin was 15.6 [facility]. TG 138. NG O/P decreased 290, PPI IV.  Endo: no hx DM - requiring significant amounts of insulin.  Lytes: none in TPN - low Na 129 and Phos 1.5, mild hypokalemia 3.4 (K+ >/= 4 for ileus). KPhos 20mmol ordered by MD  Renal: hx CKD started on CRRT on 08/08/13 for volume regulation - SCr/BUN improving,  up 60-70lbs since admit with diffuse anasarca (55kg at [facility], initial weight at [facility] was 86kg, now at 67.7kg).  NS at 10 ml/hr. Minimal to no UOP. TPN providing 2 g/kg/day of protein while on CRRT.  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 Norepi drip.  Hepatobil: AST 899>>714, ALT 86>>120, Tbili 1.9 no change.  Neuro:  ID: Primaxin for intra-abd infxn + Enterococcus UTI, afebrile, WBc 29.5.  Best Practices: PPI IV, MC  TPN Access: CVC placed 09/01/2013  TPN day#: PTA from [place] since 2/27   Current Nutrition:  None - was on Clinimix 5/15 at 82ml/hr at [facility] - placed on hold 3/3 (started 2/27). Was also on TF at various rates during [facility] stay also but didn't tolerate.  Nutritional Goals:  1612 kCal (goal for permissive underfeeding ~1200 kCal); protein 115 gm per day - TPN at 49ml/hr and Lipids 85ml/hr on MWF provides a daily average of 1620 kCal and 100gm protein daily.   Plan:  - Continue Clinimix 5/15 (ADD electrolytes) at 83 ml/hr + lipids at 10 ml/hr on MWF only to minimize kCal provision. - TPN also provides ~2L fluid per day. Renal  aware. - Daily IV multivitamin in TPN and trace elements M/W/F with national shortage - Increase regular insulin in TPN to 57 units - NaPhos x 1 by MD   Rewa Weissberg S. Merilynn Finland, PharmD, [facility] Clinical Staff Pharmacist Pager [phone]  08/12/2013, 11:50 AM

## 2013-08-12 NOTE — Progress Notes (Signed)
CVVHD management. K and PO4 low, will replace.  Change replacement fluid to 4K.  Cont on CVVHD as she is still on pressors and UO is very little.  Has been kept even since yest but will try for 50-100cc/hr.  Add back 4pm labs that were deleted.

## 2013-08-12 NOTE — Progress Notes (Signed)
ANTIBIOTIC CONSULT NOTE - FOLLOW UP  Pharmacy Consult for Imipenem Indication: intraabdominal infection  Allergies  Allergen Reactions  . Cephalexin Other (See Comments)    Unknown  . Codeine Nausea And Vomiting    Patient Measurements: Height: 5\' 1"  (154.9 cm) Weight: 149 lb 4 oz (67.7 kg) IBW/kg (Calculated) : 47.8  Vital Signs: Temp: 97.6 F (36.4 C) (03/09 0400) Temp src: Oral (03/09 0400) BP: 105/43 mmHg (03/09 0700) Pulse Rate: 89 (03/09 0700) Intake/Output from previous day: 03/08 0701 - 03/09 0700 In: 3986.7 [I.V.:1175.7; NG/GT:90; IV Piggyback:489; ZOX:0960]TPN:2232] Out: 5459 [Urine:15; Emesis/NG output:200; Drains:290] Intake/Output from this shift:    Labs:  Recent Labs  08/10/13 0429  08/11/13 0512 08/11/13 1658 08/12/13 0505  WBC 23.9*  --  30.5*  --  29.5*  HGB 8.1*  --  8.5*  --  8.1*  PLT 132*  --  120*  --  99*  CREATININE 1.24*  < > 0.84 0.67 0.58  < > = values in this interval not displayed. Estimated Creatinine Clearance: 56.8 ml/min (by C-G formula based on Cr of 0.58). No results found for this basename: VANCOTROUGH, Leodis BinetVANCOPEAK, VANCORANDOM, GENTTROUGH, GENTPEAK, GENTRANDOM, TOBRATROUGH, TOBRAPEAK, TOBRARND, AMIKACINPEAK, AMIKACINTROU, AMIKACIN,  in the last 72 hours   Microbiology: Recent Results (from the past 720 hour(s))  MRSA PCR SCREENING     Status: None   Collection Time    18-Aug-2013 11:24 AM      Result Value Ref Range Status   MRSA by PCR NEGATIVE  NEGATIVE Final   Comment:            The GeneXpert MRSA Assay (FDA     approved for NASAL specimens     only), is one component of a     comprehensive MRSA colonization     surveillance program. It is not     intended to diagnose MRSA     infection nor to guide or     monitor treatment for     MRSA infections.  URINE CULTURE     Status: None   Collection Time    18-Aug-2013 11:26 AM      Result Value Ref Range Status   Specimen Description URINE, CATHETERIZED   Final   Special Requests  Normal   Final   Culture  Setup Time     Final   Value: 05-28-2014 12:02     Performed at [facility]   Colony Count     Final   Value: >=100,000 COLONIES/ML     Performed at [facility]   Culture     Final   Value: Multiple bacterial morphotypes present, none predominant. Suggest appropriate recollection if clinically indicated.     Performed at [facility]   Report Status 08/08/2013 FINAL   Final  CULTURE, BLOOD (ROUTINE X 2)     Status: None   Collection Time    18-Aug-2013  4:20 PM      Result Value Ref Range Status   Specimen Description BLOOD   Final   Special Requests     Final   Value: BOTTLES DRAWN AEROBIC AND ANAEROBIC 10CC LEFT IJ CVC   Culture  Setup Time     Final   Value: 05-28-2014 20:59     Performed at [facility]   Culture     Final   Value:        BLOOD CULTURE RECEIVED NO GROWTH TO DATE CULTURE WILL BE HELD FOR 5 DAYS  BEFORE ISSUING A FINAL NEGATIVE REPORT     Performed at [facility]   Report Status PENDING   Incomplete    Anti-infectives   Start     Dose/Rate Route Frequency Ordered Stop   08/10/13 0600  imipenem-cilastatin (PRIMAXIN) 250 mg in sodium chloride 0.9 % 100 mL IVPB     250 mg 200 mL/hr over 30 Minutes Intravenous 3 times per day 08/09/13 0021     08/09/13 1000  imipenem-cilastatin (PRIMAXIN) 250 mg in sodium chloride 0.9 % 100 mL IVPB  Status:  Discontinued     250 mg 200 mL/hr over 30 Minutes Intravenous Every 12 hours 08/09/13 0019 08/09/13 0021   08/08/13 1000  micafungin (MYCAMINE) 100 mg in sodium chloride 0.9 % 100 mL IVPB  Status:  Discontinued     100 mg 100 mL/hr over 1 Hours Intravenous Daily 08/08/13 0950 08/08/13 1001   08/05/2013 1800  vancomycin (VANCOCIN) IVPB 1000 mg/200 mL premix  Status:  Discontinued     1,000 mg 200 mL/hr over 60 Minutes Intravenous Every 24 hours 08/05/2013 1625 08/11/13 1057   08/26/2013 1700  imipenem-cilastatin (PRIMAXIN) 250 mg in sodium chloride 0.9 % 100 mL IVPB   Status:  Discontinued     250 mg 200 mL/hr over 30 Minutes Intravenous 3 times per day 08/18/2013 1624 08/09/13 0019      Assessment: 73 year old female who continues on Primaxin (Day #6) for intraabdominal infection. Finished course of Vancomycin for enterococcal UTI. Afebrile. WBC 29.5-slight trend down. Her antibiotic dose is appropriate for CRRT.  She is tolerating CRRT at this time.  Goal of Therapy:  Treatment of infection  Plan:  1) Continue Imipenem 250 mg IV q8h. 2) F/u CRRT tolerance and plans for transition off.  Christoper Fabian, PharmD, BCPS Clinical pharmacist, pager [phone] 08/12/2013, 8:00 AM

## 2013-08-13 ENCOUNTER — Inpatient Hospital Stay (HOSPITAL_COMMUNITY): Payer: Medicare Other

## 2013-08-13 DIAGNOSIS — D72829 Elevated white blood cell count, unspecified: Secondary | ICD-10-CM

## 2013-08-13 LAB — RENAL FUNCTION PANEL
ALBUMIN: 1.6 g/dL — AB (ref 3.5–5.2)
Albumin: 1.6 g/dL — ABNORMAL LOW (ref 3.5–5.2)
BUN: 38 mg/dL — ABNORMAL HIGH (ref 6–23)
BUN: 35 mg/dL — AB (ref 6–23)
CHLORIDE: 93 meq/L — AB (ref 96–112)
CO2: 21 meq/L (ref 19–32)
CO2: 21 mEq/L (ref 19–32)
CREATININE: 0.46 mg/dL — AB (ref 0.50–1.10)
Calcium: 7.8 mg/dL — ABNORMAL LOW (ref 8.4–10.5)
Calcium: 7.9 mg/dL — ABNORMAL LOW (ref 8.4–10.5)
Chloride: 95 mEq/L — ABNORMAL LOW (ref 96–112)
Creatinine, Ser: 0.48 mg/dL — ABNORMAL LOW (ref 0.50–1.10)
GFR calc Af Amer: >90 mL/min (ref >90)
GFR calc non Af Amer: >90 mL/min (ref >90)
GFR calc non Af Amer: >90 mL/min (ref >90)
Glucose, Bld: 200 mg/dL — ABNORMAL HIGH (ref 70–99)
Glucose, Bld: 215 mg/dL — ABNORMAL HIGH (ref 70–99)
POTASSIUM: 4.6 meq/L (ref 3.7–5.3)
POTASSIUM: 4.5 meq/L (ref 3.7–5.3)
Phosphorus: 2.1 mg/dL — ABNORMAL LOW (ref 2.3–4.6)
Phosphorus: 2.9 mg/dL (ref 2.3–4.6)
SODIUM: 129 meq/L — AB (ref 137–147)
Sodium: 127 mEq/L — ABNORMAL LOW (ref 137–147)

## 2013-08-13 LAB — GLUCOSE, CAPILLARY
GLUCOSE-CAPILLARY: 160 mg/dL — AB (ref 70–99)
GLUCOSE-CAPILLARY: 175 mg/dL — AB (ref 70–99)
GLUCOSE-CAPILLARY: 214 mg/dL — AB (ref 70–99)
Glucose-Capillary: 187 mg/dL — ABNORMAL HIGH (ref 70–99)
Glucose-Capillary: 196 mg/dL — ABNORMAL HIGH (ref 70–99)
Glucose-Capillary: 183 mg/dL — ABNORMAL HIGH (ref 70–99)

## 2013-08-13 LAB — CBC WITH DIFFERENTIAL/PLATELET
BASOS PCT: 1 % (ref 0–1)
Basophils Absolute: 0.4 10*3/uL — ABNORMAL HIGH (ref 0.0–0.1)
EOS ABS: 0.8 10*3/uL — AB (ref 0.0–0.7)
Eosinophils Relative: 2 % (ref 0–5)
HCT: 23.9 % — ABNORMAL LOW (ref 36.0–46.0)
Hemoglobin: 7.8 g/dL — ABNORMAL LOW (ref 12.0–15.0)
LYMPHS PCT: 6 % — AB (ref 12–46)
Lymphs Abs: 2.3 10*3/uL (ref 0.7–4.0)
MCH: 30 pg (ref 26.0–34.0)
MCHC: 32.6 g/dL (ref 30.0–36.0)
MCV: 91.9 fL (ref 78.0–100.0)
MONO ABS: 1.9 10*3/uL — AB (ref 0.1–1.0)
Monocytes Relative: 5 % (ref 3–12)
NEUTROS ABS: 32.1 10*3/uL — AB (ref 1.7–7.7)
Neutrophils Relative %: 86 % — ABNORMAL HIGH (ref 43–77)
Platelets: 119 10*3/uL — ABNORMAL LOW (ref 150–400)
RBC: 2.6 MIL/uL — AB (ref 3.87–5.11)
RDW: 19.9 % — AB (ref 11.5–15.5)
SMEAR REVIEW: DECREASED
WBC Morphology: INCREASED
WBC: 37.5 10*3/uL — AB (ref 4.0–10.5)

## 2013-08-13 LAB — CULTURE, BLOOD (ROUTINE X 2): Culture: NO GROWTH

## 2013-08-13 LAB — MAGNESIUM: Magnesium: 2.2 mg/dL (ref 1.5–2.5)

## 2013-08-13 LAB — PATHOLOGIST SMEAR REVIEW

## 2013-08-13 LAB — APTT: APTT: 46 s — AB (ref 24–37)

## 2013-08-13 MED ORDER — NOREPINEPHRINE BITARTRATE 1 MG/ML IJ SOLN
2.0000 ug/min | INTRAMUSCULAR | Status: DC
  Administered 2013-08-13: 20 ug/min via INTRAVENOUS
  Administered 2013-08-14: 40 ug/min via INTRAVENOUS
  Administered 2013-08-14: 45 ug/min via INTRAVENOUS
  Administered 2013-08-14: 20 ug/min via INTRAVENOUS
  Administered 2013-08-15 (×2): 50 ug/min via INTRAVENOUS
  Administered 2013-08-15: 45 ug/min via INTRAVENOUS
  Administered 2013-08-16: 40 ug/min via INTRAVENOUS
  Administered 2013-08-16 – 2013-08-17 (×4): 50 ug/min via INTRAVENOUS
  Administered 2013-08-17: 50.027 ug/min via INTRAVENOUS
  Administered 2013-08-17 – 2013-08-18 (×2): 50 ug/min via INTRAVENOUS
  Administered 2013-08-18: 44.053 ug/min via INTRAVENOUS
  Administered 2013-08-19: 18 ug/min via INTRAVENOUS
  Administered 2013-08-20: 8 ug/min via INTRAVENOUS
  Administered 2013-08-21: 10 ug/min via INTRAVENOUS
  Administered 2013-08-22: 18 ug/min via INTRAVENOUS
  Filled 2013-08-13 (×25): qty 16

## 2013-08-13 MED ORDER — M.V.I. ADULT IV INJ
INTRAVENOUS | Status: AC
  Administered 2013-08-13: 18:00:00 via INTRAVENOUS
  Filled 2013-08-13: qty 2000

## 2013-08-13 MED ORDER — POTASSIUM PHOSPHATE DIBASIC 3 MMOLE/ML IV SOLN
10.0000 mmol | Freq: Once | INTRAVENOUS | Status: AC
  Administered 2013-08-13: 10 mmol via INTRAVENOUS
  Filled 2013-08-13: qty 3.33

## 2013-08-13 MED ORDER — IOHEXOL 300 MG/ML  SOLN
25.0000 mL | INTRAMUSCULAR | Status: AC
  Administered 2013-08-13 (×2): 25 mL via ORAL

## 2013-08-13 NOTE — Progress Notes (Signed)
I have seen and examined the patient and agree with the assessment and plans. Will CT her today to evaluate for potential fistula  Leah [name] A. Magnus IvanBlackman  MD, FACS

## 2013-08-13 NOTE — Progress Notes (Signed)
CVVHD management.  Still on levophed. K better.  PO4 still sl low, will replace.  Pulling about 50cc/hr and will cont at 50-100cc/hr as BP tolerates.  No UO recorded.

## 2013-08-13 NOTE — Consult Note (Signed)
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   Stomal assessment/size: Last week the stoma was very edematous and this has resolved. Stoma red and viable, 1 1/4 inches, slightly above skin level Peristomal assessment: Intact skin surrounding Output: Last week drainage was light yellow liquid.  Currently pouch with 50cc dark brown semi-formed stool Ostomy pouching: 1pc. Education provided:  Pt obtunded and on vent.  Not ready for teaching.  Will follow and begin sessions when stable and out of ICU.  No family at bedside. Applied one piece pouch.  WOC wound follow-up consult note CCS team following for assessment and plan of care. Wound type: Midline abd remains with staples intact and well approximated except to middle abd.  Open area between staples approx 3 X1cm draining mod amt dark brown liquid.  No odor.  This drainage was previously clear yellow.  Applied small [place] pouch to control drainage over open area.  Connected to bedside drainage bag. Extra supplies at bedside for nursing to use PRN if leakage occurs. Cammie Mcgeeawn Darrius Montano MSN, RN, CWOCN, [place], CNS [phone]

## 2013-08-13 NOTE — Progress Notes (Signed)
PULMONARY / CRITICAL CARE MEDICINE   Name: Leah [name] MRN: [id number] DOB: 11/06/1940    ADMISSION DATE:  08/04/2013  REFERRING MD :  [facility]  PRIMARY SERVICE: PCCM  CHIEF COMPLAINT:  Respiratory Failure   BRIEF PATIENT DESCRIPTION: 73 y/o admitted to [facility] for planned repair of enterocolonic fistula.  Course was complicated by ileus, renal failure, cardiopulmonary arrest.  Transferred to [facility] on 3/4 for further care.   LINES / TUBES: R IJ TLC 2/14 >> 3/04 ETT([place])  2/23 >>  R IJ HD cath 3/04 >>  L IJ TLC 3/04 >>    CULTURES: ?date [facility]) >>> ENTEROCOCCUS FAECALIS 3/4 MRSA PCR >>> neg 3/4 Urine >>>  Multiple bacterial morphotypes  3/4 Blood >>   ANTIBIOTICS: Vancomycin 3/4 >> 3/08 Imipenem 3/4 >>   SIGNIFICANT EVENTS / STUDIES:  2/13  OR >>> Ex-lap with small bowel resection, extensive lysis of adhesions, vomiting 2/22  Cardiac arrest - VDRF/Intubated 08/29/2013 - ADMIT [facility] 3/05  TTE: EF 35-40, grade 2 diastolic dysfunction  3/05  CVVHD started 3/8 - No new issues. Tolerates PS 10 cm H2O 08/12/13: 40# off [facility]. Still anuric. Patient is DNAR but full medical care. On levophed .. Family at bedside    SUBJECTIVE/OVERNIGHT/INTERVAL HX 08/13/13: No sedation but on levophed . RN says occ opens eyes but unresponsive to this MD (last fentanyl was 1aM)  VITAL SIGNS: Temp:  [97 F (36.1 C)-98.2 F (36.8 C)] 97 F (36.1 C) (03/10 1300) Pulse Rate:  [66-109] 77 (03/10 1300) Resp:  [25-38] 25 (03/10 1300) BP: (82-116)/(35-58) 107/42 mmHg (03/10 1300) SpO2:  [91 %-100 %] 97 % (03/10 1300) Arterial Line BP: (62-124)/(24-54) 93/37 mmHg (03/10 1300) FiO2 (%):  [30 %] 30 % (03/10 1300) Weight:  [66 kg (145 lb 8.1 oz)] 66 kg (145 lb 8.1 oz) (03/10 0400)  HEMODYNAMICS:   VENTILATOR SETTINGS: Vent Mode:  [-] PRVC FiO2 (%):  [30 %] 30 % Set Rate:  [16 bmp] 16 bmp Vt Set:  [380 mL] 380 mL PEEP:  [5  cmH20] 5 cmH20 Plateau Pressure:  [23 cmH20-24 cmH20] 24 cmH20  INTAKE / OUTPUT: Intake/Output     03/09 0701 - 03/10 0700 03/10 0701 - 03/11 0700   I.V. (mL/kg) 1253.5 (19) 450.2 (6.8)   NG/GT 60 650   IV Piggyback 854 210   TPN 2232 558   Total Intake(mL/kg) 4399.5 (66.7) 1868.2 (28.3)   Urine (mL/kg/hr) 5 (0)    Emesis/NG output 250 (0.2)    Drains 245 (0.2) 102 (0.2)   Other 4584 (2.9) 1518 (3.3)   Total Output 5084 1620   Net -684.5 +248.2         PHYSICAL EXAMINATION: General:  Very deconditioned. Critically ill looking Neuro: Diffusely weak, no focal deficits, RASS -3 (last fentanyl 0) HEENT: WNL Cardiovascular:  RRR s M Lungs: clear anteriorly Abdomen:  BS absent, LLQ colostomy, RLQ fistula draining serous fluid Ext:  3-4+ symmetric UE and LE edema   PULMONARY  Recent Labs Lab 09/02/2013 1314 08/11/13 1827 08/12/13 0325  PHART 7.315* 7.233* 7.392  PCO2ART 37.3 53.9* 36.8  PO2ART 67.0* 96.8 73.9*  HCO3 19.0* 22.1 22.0  TCO2 20 23.8 23.2  O2SAT 92.0 97.0 95.6    CBC  Recent Labs Lab 08/11/13 0512 08/12/13 0505 08/13/13 0507  HGB 8.5* 8.1* 7.8*  HCT 25.6* 24.0* 23.9*  WBC 30.5* 29.5* 37.5*  PLT 120* 99* 119*    COAGULATION No  results found for this basename: INR,  in the last 168 hours  CARDIAC  No results found for this basename: TROPONINI,  in the last 168 hours No results found for this basename: PROBNP,  in the last 168 hours   CHEMISTRY  Recent Labs Lab 08/09/13 0500  08/10/13 0429  08/11/13 0512 08/11/13 0607 08/11/13 1658 08/12/13 0505 08/12/13 1630 08/13/13 0507  NA 133*  < > 133*  < > 131*  --  126* 129* 127* 129*  K 4.3  < > 3.5*  < > 3.8  --  4.0 3.4* 4.5 4.6  CL 98  < > 97  < > 96  --  93* 95* 94* 95*  CO2 19  < > 22  < > 22  --  23 21 21 21   GLUCOSE 112*  < > 208*  < > 203*  --  246* 227* 238* 200*  BUN 71*  < > 48*  < > 40*  --  38* 38* 37* 38*  CREATININE 1.96*  < > 1.24*  < > 0.84  --  0.67 0.58 0.54 0.48*  CALCIUM  7.8*  < > 7.5*  < > 7.7*  --  7.8* 7.7* 7.6* 7.8*  MG 2.2  --  2.1  --  2.4  --   --  2.1  --  2.2  PHOS 5.3*  < > 3.0  < >  --  2.0* 2.9 1.5* 2.6 2.1*  < > = values in this interval not displayed. Estimated Creatinine Clearance: 56.1 ml/min (by C-G formula based on Cr of 0.48).   LIVER  Recent Labs Lab 08/08/2013 1200 08/08/13 0405  08/11/13 0512 08/11/13 1658 08/12/13 0505 08/12/13 1630 08/13/13 0507  AST 446* 555*  --  899*  --  714*  --   --   ALT 38* 34  --  86*  --  120*  --   --   ALKPHOS 102 101  --  120*  --  119*  --   --   BILITOT 1.6* 1.7*  --  1.9*  --  1.9*  --   --   PROT 4.6* 4.7*  --  5.0*  --  4.8*  --   --   ALBUMIN 2.4* 2.2*  < > 1.8* 1.8* 1.7* 1.5* 1.6*  < > = values in this interval not displayed.   INFECTIOUS No results found for this basename: LATICACIDVEN, PROCALCITON,  in the last 168 hours   ENDOCRINE CBG (last 3)   Recent Labs  08/13/13 0436 08/13/13 0745 08/13/13 1321  GLUCAP 187* 175* 196*         IMAGING x48h  Dg Chest Port 1 View  08/13/2013   CLINICAL DATA:  Hypoxia  EXAM: PORTABLE CHEST - 1 VIEW  COMPARISON:  August 12, 2013  FINDINGS: Endotracheal tube tip is 4.1 cm above the carina. Right jugular catheter tip is in the superior vena cava near the cavoatrial junction. Port-A-Cath tip is in the superior vena cava. No pneumothorax.  There is cardiomegaly which is stable. There is consolidation in both lower lobes, more on the left than on the right. There is interstitial edema in the mid and lower lung zones bilaterally. There are small effusions bilaterally. No adenopathy.  IMPRESSION: Tube and catheter positions as described without pneumothorax. Consolidation in both lower lobes, more on the left than on the right. Suspect a degree of underlying congestive heart failure.   Electronically Signed   By: Chrissie NoaWilliam  Margarita Grizzle M.D.   On: 08/13/2013 07:41   Dg Chest Port 1 View  08/12/2013   CLINICAL DATA:  Follow-up respiratory failure.   EXAM: PORTABLE CHEST - 1 VIEW  COMPARISON:  08/11/2013  FINDINGS: Endotracheal tube is 3.9 cm above the carina. Nasogastric tube extends into the abdomen. Bilateral jugular central venous catheters are in the SVC region. Heart size is grossly stable. Again noted are patchy interstitial and airspace densities bilaterally. Airspace density has mildly improved at the lung bases. Negative for a pneumothorax.  IMPRESSION: Slightly decreased airspace disease may reflect decreasing edema.  Support apparatuses as described.   Electronically Signed   By: Richarda Overlie M.D.   On: 08/12/2013 07:39      ASSESSMENT / PLAN:  PULMONARY A: Acute respiratory failure COPD without exacerbation LLL PNA on CT abd 08/13/13   - Failure to wean due to severe physical deconditioning P:   Full vent support SBT as tolerated Needs trach; discussed indication with husband and sister in law  CARDIOVASCULAR A:  Septic shock Cardiogenic shock S/p Cardiopulmonary arrest Acute on chronic systolic and diastolic CHF NSTEMI 2/23 [facility])   - still on pressors  P:  Wean NE to off for MAP > 65 mmHg  RENAL A:   AKI, anuric Hyperkalemia, resolved Anasasrca   - still anuric. 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 Severe Ileus Protein calorie malnutrition   - CT abdomen for possible ileus done  P:   Await CT SUP: IV PPI Cont TPN Surgery following  HEMATOLOGIC A:   Anemia Mild thrombocytopenia P:  DVT px: SCDs Trend CBC  INFECTIOUS A:   Severe sepsis UTI P:   Cx / abx as above   ENDOCRINE A:   Hyperglycemia P:   Cont SSI   NEUROLOGIC A:   Acute encephalopathy   - still only minimal responsive only but per RN occ follows commands P:   Goal RASS 0 to -1 Cont PRN fentanyl; reduce dose  GLOBAL  08/12/13 husband not at bedside but learned during multi disciplinary rounds that despite several health care providers educating him  about trach, ltach he is unable to comprehend what this all means. However, it appears emotionally he wants her to continue to get care and is not interested in pure palliation and he is hopeful for a recovery. Therefore, willl offer trach  08/13/13: husband consented for trach  I have personally obtained history, examined patient, evaluated and interpreted laboratory and imaging results, reviewed medical records, formulated assessment / plan and placed orders.  CRITICAL CARE:  The patient is critically ill with multiple organ systems failure and requires high complexity decision making for assessment and support, frequent evaluation and titration of therapies, application of advanced monitoring technologies and extensive interpretation of multiple databases. Critical Care Time devoted to patient care services described in this note is 40 minutes.   Dr. Kalman Shan, M.D., [facility].C.P Pulmonary and Critical Care Medicine Staff Physician [facility] [place] [facility] Pager: [phone], If no answer or between  15:00h - 7:00h: call [phone]  08/13/2013 2:03 PM

## 2013-08-13 NOTE — Progress Notes (Signed)
Patient ID: Leah FurlongSandra K [name], female   DOB: 02/07/1941, 73 y.o.   MRN: [id number]    Subjective: Pt on vent.  Open eyes to her name  Objective: Vital signs in last 24 hours: Temp:  [97.3 F (36.3 C)-98.2 F (36.8 C)] 97.5 F (36.4 C) (03/10 0745) Pulse Rate:  [66-109] 97 (03/10 0823) Resp:  [28-37] 35 (03/10 0823) BP: (82-119)/(35-58) 98/45 mmHg (03/10 0823) SpO2:  [91 %-100 %] 94 % (03/10 0823) Arterial Line BP: (62-124)/(24-54) 99/43 mmHg (03/10 0800) FiO2 (%):  [30 %] 30 % (03/10 0824) Weight:  [145 lb 8.1 oz (66 kg)] 145 lb 8.1 oz (66 kg) (03/10 0400) Last BM Date: 08/09/13  Intake/Output from previous day: 03/09 0701 - 03/10 0700 In: 4399.5 [I.V.:1253.5; NG/GT:60; IV Piggyback:854; WUJ:8119]TPN:2232] Out: 5084 [Urine:5; Emesis/NG output:250; Drains:245] Intake/Output this shift: Total I/O In: 148 [I.V.:55; TPN:93] Out: 205 [Drains:2; Other:203]  PE: Abd: softer, abdominal incision draining thick brown/bilious output, ostomy is healthy but no output  Lab Results:   Recent Labs  08/12/13 0505 08/13/13 0507  WBC 29.5* 37.5*  HGB 8.1* 7.8*  HCT 24.0* 23.9*  PLT 99* 119*   BMET  Recent Labs  08/12/13 1630 08/13/13 0507  NA 127* 129*  K 4.5 4.6  CL 94* 95*  CO2 21 21  GLUCOSE 238* 200*  BUN 37* 38*  CREATININE 0.54 0.48*  CALCIUM 7.6* 7.8*   PT/INR No results found for this basename: LABPROT, INR,  in the last 72 hours CMP     Component Value Date/Time   NA 129* 08/13/2013 0507   K 4.6 08/13/2013 0507   CL 95* 08/13/2013 0507   CO2 21 08/13/2013 0507   GLUCOSE 200* 08/13/2013 0507   BUN 38* 08/13/2013 0507   CREATININE 0.48* 08/13/2013 0507   CALCIUM 7.8* 08/13/2013 0507   PROT 4.8* 08/12/2013 0505   ALBUMIN 1.6* 08/13/2013 0507   AST 714* 08/12/2013 0505   ALT 120* 08/12/2013 0505   ALKPHOS 119* 08/12/2013 0505   BILITOT 1.9* 08/12/2013 0505   GFRNONAA >90 08/13/2013 0507   GFRAA >90 08/13/2013 0507   Lipase  No results found for this basename: lipase        Studies/Results: Dg Chest Port 1 View  08/13/2013   CLINICAL DATA:  Hypoxia  EXAM: PORTABLE CHEST - 1 VIEW  COMPARISON:  August 12, 2013  FINDINGS: Endotracheal tube tip is 4.1 cm above the carina. Right jugular catheter tip is in the superior vena cava near the cavoatrial junction. Port-A-Cath tip is in the superior vena cava. No pneumothorax.  There is cardiomegaly which is stable. There is consolidation in both lower lobes, more on the left than on the right. There is interstitial edema in the mid and lower lung zones bilaterally. There are small effusions bilaterally. No adenopathy.  IMPRESSION: Tube and catheter positions as described without pneumothorax. Consolidation in both lower lobes, more on the left than on the right. Suspect a degree of underlying congestive heart failure.   Electronically Signed   By: Bretta BangWilliam  Woodruff M.D.   On: 08/13/2013 07:41   Dg Chest Port 1 View  08/12/2013   CLINICAL DATA:  Follow-up respiratory failure.  EXAM: PORTABLE CHEST - 1 VIEW  COMPARISON:  08/11/2013  FINDINGS: Endotracheal tube is 3.9 cm above the carina. Nasogastric tube extends into the abdomen. Bilateral jugular central venous catheters are in the SVC region. Heart size is grossly stable. Again noted are patchy interstitial and airspace densities bilaterally. Airspace density has  mildly improved at the lung bases. Negative for a pneumothorax.  IMPRESSION: Slightly decreased airspace disease may reflect decreasing edema.  Support apparatuses as described.   Electronically Signed   By: Richarda Overlie M.D.   On: 08/12/2013 07:39    Anti-infectives: Anti-infectives   Start     Dose/Rate Route Frequency Ordered Stop   08/10/13 0600  imipenem-cilastatin (PRIMAXIN) 250 mg in sodium chloride 0.9 % 100 mL IVPB     250 mg 200 mL/hr over 30 Minutes Intravenous 3 times per day 08/09/13 0021     08/09/13 1000  imipenem-cilastatin (PRIMAXIN) 250 mg in sodium chloride 0.9 % 100 mL IVPB  Status:  Discontinued      250 mg 200 mL/hr over 30 Minutes Intravenous Every 12 hours 08/09/13 0019 08/09/13 0021   08/08/13 1000  micafungin (MYCAMINE) 100 mg in sodium chloride 0.9 % 100 mL IVPB  Status:  Discontinued     100 mg 100 mL/hr over 1 Hours Intravenous Daily 08/08/13 0950 08/08/13 1001   08-Aug-2013 1800  vancomycin (VANCOCIN) IVPB 1000 mg/200 mL premix  Status:  Discontinued     1,000 mg 200 mL/hr over 60 Minutes Intravenous Every 24 hours 2013-08-08 1625 08/11/13 1057   08/08/2013 1700  imipenem-cilastatin (PRIMAXIN) 250 mg in sodium chloride 0.9 % 100 mL IVPB  Status:  Discontinued     250 mg 200 mL/hr over 30 Minutes Intravenous 3 times per day August 08, 2013 1624 08/09/13 0019       Assessment/Plan  1. 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 VDRF 3. ? EC fistula 4. Leukocytosis  Plan: 1. Will order a CT scan today given increasing WBC and change in wound output contents.  It went from serous to thick brown output.  Cont NGT and NPO, until we can determine if this is a fistula or not.   LOS: 6 days    Ishana Blades E 08/13/2013, 8:31 AM Pager: [phone]

## 2013-08-13 NOTE — Plan of Care (Signed)
Problem: Phase I Progression Outcomes Goal: Initial discharge plan identified Outcome: Completed/Met Date Met:  08/13/13 Plan to progress to [facility]

## 2013-08-13 NOTE — Progress Notes (Signed)
PARENTERAL NUTRITION CONSULT NOTE:  FOLLOW-UP  Pharmacy Consult:  TPN Indication: Suspect TF intolerance d/t to severe bowel edema  Allergies  Allergen Reactions  . Cephalexin Other (See Comments)    Unknown  . Codeine Nausea And Vomiting    Patient Measurements: Height: 5\' 1"  (154.9 cm) Weight: 145 lb 8.1 oz (66 kg) IBW/kg (Calculated) : 47.8  Usual Weight: 55 kg (this was admit weight at [facility] 2/13)  Vital Signs: Temp: 97.5 F (36.4 C) (03/10 0745) Temp src: Oral (03/10 0745) BP: 98/52 mmHg (03/10 0900) Pulse Rate: 87 (03/10 0900) Intake/Output from previous day: 03/09 0701 - 03/10 0700 In: 4399.5 [I.V.:1253.5; NG/GT:60; IV Piggyback:854; TPN:2232] Out: 5084 [Urine:5; Emesis/NG output:250; Drains:245]  Labs:  Recent Labs  08/11/13 0512 08/12/13 0505 08/13/13 0507  WBC 30.5* 29.5* 37.5*  HGB 8.5* 8.1* 7.8*  HCT 25.6* 24.0* 23.9*  PLT 120* 99* 119*  APTT 56* 50* 46*     Recent Labs  08/11/13 0512  08/12/13 0505 08/12/13 1630 08/13/13 0507  NA 131*  < > 129* 127* 129*  K 3.8  < > 3.4* 4.5 4.6  CL 96  < > 95* 94* 95*  CO2 22  < > 21 21 21   GLUCOSE 203*  < > 227* 238* 200*  BUN 40*  < > 38* 37* 38*  CREATININE 0.84  < > 0.58 0.54 0.48*  CALCIUM 7.7*  < > 7.7* 7.6* 7.8*  MG 2.4  --  2.1  --  2.2  PHOS  --   < > 1.5* 2.6 2.1*  PROT 5.0*  --  4.8*  --   --   ALBUMIN 1.8*  < > 1.7* 1.5* 1.6*  AST 899*  --  714*  --   --   ALT 86*  --  120*  --   --   ALKPHOS 120*  --  119*  --   --   BILITOT 1.9*  --  1.9*  --   --   PREALBUMIN  --   --  12.3*  --   --   TRIG  --   --  138  --   --   < > = values in this interval not displayed. Estimated Creatinine Clearance: 56.1 ml/min (by C-G formula based on Cr of 0.48).    Recent Labs  08/12/13 2354 08/13/13 0436 08/13/13 0745  GLUCAP 214* 187* 175*     Insulin Requirements in the past 24 hours:  39 units moderate SSI + 57 units regular insulin in TPN with CBGs 175-261 last 24h (247, 214, 187, 175  since new bag with increased insulin)  Assessment: 67 YOF admitted 07/19/13 to [facility] for planned repair of enterocolonic fistula. Post-op course complicated by ileus, renal failure, and cardiopulmonary arrest.  Patient transferred to [facility] on 08/18/2013.  GI: bowels are too edematous to tolerate TF and may trickle feed when volume status improves per Surgery. Surgery no longer suspects EF fistula. Baseline prealbumin was 15.6 now 12.3. TG 138. NG O/P 250, PPI IV. Change in wound output to thick brown. Check CT scan.  Endo: no hx DM - requiring significant amounts of insulin. See above. - Will not add additional insulin today since she is trending downward and re-evaluate tomorrow.  Lytes: Low Na 129 (stable) and Phos 1.5>>2.5>>2.1 today after KPhos ordered by MD 3/9. K replaced to 4.6. Kphos ordered today by MD.  Renal: hx CKD started on CRRT on 08/08/13 for volume regulation - SCr/BUN improving,  up 60-70lbs since admit with diffuse anasarca ([facility], [facility], now at 67.7kg).  NS at 10 ml/hr. Minimal to no UOP. TPN providing 2 g/kg/day of protein while on CRRT.  Pulm: [facility] - FiO2 30% (stable)  Cards: AoC dHF (EF 35-40%), s/p cardio [facility] -VSS but HR up to 117. Norepi drip.  Hepatobil: AST 899>>714, ALT 86>>120, Tbili 1.9 no change.  Neuro: opens eyes to her name reported  ID: Primaxin for intra-abd infxn + Enterococcus UTI, afebrile, WBC up to 37.5.   Best Practices: PPI IV, MC, SCDs on patient.  TPN Access: CVC placed 08/25/2013  TPN day#: PTA [facility] since 2/27   Current Nutrition:  - TPN at 6883ml/hr and Lipids 5810ml/hr on MWF provides a daily average of 1620 kCal and 100gm protein daily.  Nutritional Goals:  1612 kCal (goal for permissive underfeeding ~1200 kCal); protein 115 gm per day   Plan:  - Continue Clinimix E 5/15 (ADDed electrolytes) at 83 ml/hr + lipids at 10 ml/hr on MWF only to minimize kCal  provision. - TPN also provides ~2L fluid per day. Renal aware. - Daily IV multivitamin in TPN and trace elements M/W/F with national shortage - NaPhos 10mmol x 1 by MD -No change in TPN today 3/10.   Tylyn Stankovich S. Merilynn Finlandobertson, PharmD, [facility] Clinical Staff Pharmacist Pager [phone]  08/13/2013, 9:42 AM

## 2013-08-14 ENCOUNTER — Inpatient Hospital Stay (HOSPITAL_COMMUNITY): Payer: Medicare Other

## 2013-08-14 LAB — PROTIME-INR
INR: 1.16 (ref 0.00–1.49)
PROTHROMBIN TIME: 14.6 s (ref 11.6–15.2)

## 2013-08-14 LAB — DIFFERENTIAL
BASOS PCT: 2 % — AB (ref 0–1)
Basophils Absolute: 0.8 10*3/uL — ABNORMAL HIGH (ref 0.0–0.1)
EOS ABS: 0.8 10*3/uL — AB (ref 0.0–0.7)
EOS PCT: 2 % (ref 0–5)
Lymphocytes Relative: 5 % — ABNORMAL LOW (ref 12–46)
Lymphs Abs: 1.9 10*3/uL (ref 0.7–4.0)
MONOS PCT: 6 % (ref 3–12)
Monocytes Absolute: 2.3 10*3/uL — ABNORMAL HIGH (ref 0.1–1.0)
NEUTROS ABS: 33.1 10*3/uL — AB (ref 1.7–7.7)
NEUTROS PCT: 85 % — AB (ref 43–77)
WBC MORPHOLOGY: INCREASED

## 2013-08-14 LAB — CBC
HEMATOCRIT: 24 % — AB (ref 36.0–46.0)
Hemoglobin: 7.8 g/dL — ABNORMAL LOW (ref 12.0–15.0)
MCH: 30.1 pg (ref 26.0–34.0)
MCHC: 32.5 g/dL (ref 30.0–36.0)
MCV: 92.7 fL (ref 78.0–100.0)
Platelets: 108 10*3/uL — ABNORMAL LOW (ref 150–400)
RBC: 2.59 MIL/uL — AB (ref 3.87–5.11)
RDW: 20.6 % — ABNORMAL HIGH (ref 11.5–15.5)
WBC: 38.9 10*3/uL — AB (ref 4.0–10.5)

## 2013-08-14 LAB — RENAL FUNCTION PANEL
ALBUMIN: 1.6 g/dL — AB (ref 3.5–5.2)
Albumin: 1.5 g/dL — ABNORMAL LOW (ref 3.5–5.2)
BUN: 36 mg/dL — ABNORMAL HIGH (ref 6–23)
BUN: 36 mg/dL — ABNORMAL HIGH (ref 6–23)
CHLORIDE: 97 meq/L (ref 96–112)
CO2: 21 meq/L (ref 19–32)
CO2: 23 meq/L (ref 19–32)
CREATININE: 0.4 mg/dL — AB (ref 0.50–1.10)
Calcium: 8.2 mg/dL — ABNORMAL LOW (ref 8.4–10.5)
Calcium: 8.1 mg/dL — ABNORMAL LOW (ref 8.4–10.5)
Chloride: 97 mEq/L (ref 96–112)
Creatinine, Ser: 0.41 mg/dL — ABNORMAL LOW (ref 0.50–1.10)
GFR calc non Af Amer: >90 mL/min (ref >90)
GLUCOSE: 177 mg/dL — AB (ref 70–99)
Glucose, Bld: 162 mg/dL — ABNORMAL HIGH (ref 70–99)
PHOSPHORUS: 2.5 mg/dL (ref 2.3–4.6)
POTASSIUM: 4.6 meq/L (ref 3.7–5.3)
POTASSIUM: 4.5 meq/L (ref 3.7–5.3)
Phosphorus: 2.7 mg/dL (ref 2.3–4.6)
SODIUM: 130 meq/L — AB (ref 137–147)
SODIUM: 131 meq/L — AB (ref 137–147)

## 2013-08-14 LAB — APTT: APTT: 51 s — AB (ref 24–37)

## 2013-08-14 LAB — PROCALCITONIN: Procalcitonin: 4.38 ng/mL

## 2013-08-14 LAB — GLUCOSE, CAPILLARY
GLUCOSE-CAPILLARY: 146 mg/dL — AB (ref 70–99)
GLUCOSE-CAPILLARY: 128 mg/dL — AB (ref 70–99)
Glucose-Capillary: 161 mg/dL — ABNORMAL HIGH (ref 70–99)
Glucose-Capillary: 153 mg/dL — ABNORMAL HIGH (ref 70–99)
Glucose-Capillary: 136 mg/dL — ABNORMAL HIGH (ref 70–99)
Glucose-Capillary: 143 mg/dL — ABNORMAL HIGH (ref 70–99)

## 2013-08-14 LAB — MAGNESIUM: MAGNESIUM: 2.3 mg/dL (ref 1.5–2.5)

## 2013-08-14 MED ORDER — ETOMIDATE 2 MG/ML IV SOLN
10.0000 mg | Freq: Once | INTRAVENOUS | Status: AC
  Administered 2013-08-14: 10 mg via INTRAVENOUS

## 2013-08-14 MED ORDER — MIDAZOLAM HCL 2 MG/2ML IJ SOLN
2.0000 mg | Freq: Once | INTRAMUSCULAR | Status: AC
  Administered 2013-08-14: 2 mg via INTRAVENOUS

## 2013-08-14 MED ORDER — FENTANYL CITRATE 0.05 MG/ML IJ SOLN
100.0000 ug | Freq: Once | INTRAMUSCULAR | Status: AC
  Administered 2013-08-14: 100 ug via INTRAVENOUS

## 2013-08-14 MED ORDER — VECURONIUM BROMIDE 10 MG IV SOLR
7.0000 mg | Freq: Once | INTRAVENOUS | Status: AC
  Administered 2013-08-14: 7 mg via INTRAVENOUS

## 2013-08-14 MED ORDER — TRACE MINERALS CR-CU-F-FE-I-MN-MO-SE-ZN IV SOLN
INTRAVENOUS | Status: AC
  Administered 2013-08-14: 18:00:00 via INTRAVENOUS
  Filled 2013-08-14: qty 2000

## 2013-08-14 MED ORDER — SODIUM CHLORIDE 0.9 % IV SOLN
20.0000 ug | INTRAVENOUS | Status: AC
  Administered 2013-08-14: 20 ug via INTRAVENOUS
  Filled 2013-08-14: qty 5

## 2013-08-14 MED ORDER — FENTANYL CITRATE 0.05 MG/ML IJ SOLN
INTRAMUSCULAR | Status: AC
  Administered 2013-08-14: 100 ug
  Filled 2013-08-14: qty 4

## 2013-08-14 MED ORDER — FAT EMULSION 20 % IV EMUL
250.0000 mL | INTRAVENOUS | Status: AC
  Administered 2013-08-14: 250 mL via INTRAVENOUS
  Filled 2013-08-14: qty 250

## 2013-08-14 MED ORDER — FENTANYL CITRATE 0.05 MG/ML IJ SOLN
25.0000 ug | INTRAMUSCULAR | Status: DC | PRN
  Administered 2013-08-14: 100 ug via INTRAVENOUS
  Administered 2013-08-15 – 2013-08-21 (×2): 25 ug via INTRAVENOUS
  Administered 2013-08-22: 100 ug via INTRAVENOUS
  Filled 2013-08-14 (×5): qty 2

## 2013-08-14 MED ORDER — MIDAZOLAM HCL 2 MG/2ML IJ SOLN
INTRAMUSCULAR | Status: AC
  Administered 2013-08-14: 2 mg
  Filled 2013-08-14: qty 4

## 2013-08-14 NOTE — Progress Notes (Signed)
CVVHD management.  Metabolically stable.  K and PO4 OK.  Pressor requirements increased.  Will keep even for now due to hypotension.

## 2013-08-14 NOTE — Procedures (Signed)
Percutaneous Tracheostomy Placement  Consent from family.  Patient sedated, paralyzed and positioned.  Area cleaned, lidocaine/epi injected, skin incision done followed by blunt dissection.  Airway entered and introducer advanced.  Visualized in the airway bronchoscopically.  Wire placed and visualized.  Airway crushed then dilated.  Tracheostomy placed and visualized well above carina.  Patient tolerated the procedure well without complications.  CXR ordered and pending.  Leah ReedyWesam G. Rogenia Werntz, M.D. [facility] Pulmonary/Critical Care Medicine. Pager: [phone]. After hours pager: [phone].

## 2013-08-14 NOTE — Progress Notes (Signed)
PULMONARY / CRITICAL CARE MEDICINE   Name: Leah [name] MRN: [id number] DOB: 03-08-1941    ADMISSION DATE:  08/28/2013  REFERRING MD :  [name]  PRIMARY SERVICE: PCCM  CHIEF COMPLAINT:  Respiratory Failure   BRIEF PATIENT DESCRIPTION: 73 y/o admitted to [name] for planned repair of enterocolonic fistula.  Course was complicated by ileus, renal failure, cardiopulmonary arrest.  Transferred to [name] on 3/4 for further care.   LINES / TUBES: R IJ TLC 2/14 >> 3/04 ETT([place])  2/23 >>  R IJ HD cath 3/04 >>  L IJ TLC 3/04 >>    CULTURES: ?date [facility]) >>> ENTEROCOCCUS FAECALIS 3/4 MRSA PCR >>> neg 3/4 Urine >>>  Multiple bacterial morphotypes  3/5 Blood >>  neg     ANTIBIOTICS: Vancomycin 3/4 >> 3/08 Imipenem 3/4 >>   SIGNIFICANT EVENTS / STUDIES:  2/13  OR >>> Ex-lap with small bowel resection, extensive lysis of adhesions, vomiting 2/22  Cardiac arrest - VDRF/Intubated 09/03/2013 - ADMIT [facility] 3/05  TTE: EF 35-40, grade 2 diastolic dysfunction  3/05  CVVHD started 3/8 - No new issues. Tolerates PS 10 cm H2O 08/12/13: 40# off [facility]. Still anuric. Patient is DNAR but full medical care. On levophed .. Family at bedside 08/13/13: No sedation but on levophed . RN says occ opens eyes but unresponsive to this MD (last fentanyl was 1aM)   SUBJECTIVE/OVERNIGHT/INTERVAL HX 08/14/13: For Trach today. WC upa at 40; worsening LLQ enteric fistula iwtih collection on CT; high risk for re-exploration per CCS. Still on levophed at . On CRRT. Is overbreathing vent; opens eyes  VITAL SIGNS: Temp:  [93.6 F (34.2 C)-97.3 F (36.3 C)] 96.7 F (35.9 C) (03/11 0700) Pulse Rate:  [68-103] 98 (03/11 1115) Resp:  [19-38] 34 (03/11 1115) BP: (91-129)/(35-60) 108/40 mmHg (03/11 1115) SpO2:  [92 %-99 %] 96 % (03/11 1115) Arterial Line BP: (88-134)/(35-48) 104/37 mmHg (03/11 1015) FiO2 (%):  [30 %] 30 % (03/11 1115) Weight:   [63.231 kg (139 lb 6.4 oz)] 63.231 kg (139 lb 6.4 oz) (03/11 0457)  HEMODYNAMICS: CVP:  [3 mmHg] 3 mmHg VENTILATOR SETTINGS: Vent Mode:  [-] PRVC FiO2 (%):  [30 %] 30 % Set Rate:  [16 bmp] 16 bmp Vt Set:  [380 mL] 380 mL PEEP:  [5 cmH20] 5 cmH20 Plateau Pressure:  [20 cmH20-24 cmH20] 24 cmH20  INTAKE / OUTPUT: Intake/Output     03/10 0701 - 03/11 0700 03/11 0701 - 03/12 0700   I.V. (mL/kg) 1014.8 (16) 98.7 (1.6)   NG/GT 710    IV Piggyback 594    TPN 2112 249   Total Intake(mL/kg) 4430.8 (70.1) 347.7 (5.5)   Urine (mL/kg/hr)  5 (0)   Emesis/NG output 500 (0.3)    Drains 401 (0.3)    Other 4523 (3) 542 (1.7)   Total Output 5424 547   Net -993.2 -199.3         PHYSICAL EXAMINATION: General:  Very deconditioned. Critically ill looking Neuro: Diffusely weak, no focal deficits, RASS -3 but to RN has opened eyes occ and tracked HEENT: WNL Cardiovascular:  RRR s M Lungs: clear anteriorly Abdomen:  BS absent, LLQ colostomy, RLQ fistula draining serous fluid Ext:  2+ symmetric UE and LE edema   PULMONARY  Recent Labs Lab 08/08/2013 1314 08/11/13 1827 08/12/13 0325  PHART 7.315* 7.233* 7.392  PCO2ART 37.3 53.9* 36.8  PO2ART 67.0* 96.8 73.9*  HCO3 19.0* 22.1 22.0  TCO2 20  23.8 23.2  O2SAT 92.0 97.0 95.6    CBC  Recent Labs Lab 08/12/13 0505 08/13/13 0507 08/14/13 0430  HGB 8.1* 7.8* 7.8*  HCT 24.0* 23.9* 24.0*  WBC 29.5* 37.5* 38.9*  PLT 99* 119* 108*    COAGULATION No results found for this basename: INR,  in the last 168 hours  CARDIAC  No results found for this basename: TROPONINI,  in the last 168 hours No results found for this basename: PROBNP,  in the last 168 hours   CHEMISTRY  Recent Labs Lab 08/10/13 0429  08/11/13 0512  08/12/13 0505 08/12/13 1630 08/13/13 0507 08/13/13 1643 08/14/13 0430  NA 133*  < > 131*  < > 129* 127* 129* 127* 130*  K 3.5*  < > 3.8  < > 3.4* 4.5 4.6 4.5 4.6  CL 97  < > 96  < > 95* 94* 95* 93* 97  CO2 22  < >  22  < > 21 21 21 21 21   GLUCOSE 208*  < > 203*  < > 227* 238* 200* 215* 177*  BUN 48*  < > 40*  < > 38* 37* 38* 35* 36*  CREATININE 1.24*  < > 0.84  < > 0.58 0.54 0.48* 0.46* 0.41*  CALCIUM 7.5*  < > 7.7*  < > 7.7* 7.6* 7.8* 7.9* 8.2*  MG 2.1  --  2.4  --  2.1  --  2.2  --  2.3  PHOS 3.0  < >  --   < > 1.5* 2.6 2.1* 2.9 2.5  < > = values in this interval not displayed. Estimated Creatinine Clearance: 55 ml/min (by C-G formula based on Cr of 0.41).   LIVER  Recent Labs Lab 08/08/13 0405  08/11/13 0512  08/12/13 0505 08/12/13 1630 08/13/13 0507 08/13/13 1643 08/14/13 0430  AST 555*  --  899*  --  714*  --   --   --   --   ALT 34  --  86*  --  120*  --   --   --   --   ALKPHOS 101  --  120*  --  119*  --   --   --   --   BILITOT 1.7*  --  1.9*  --  1.9*  --   --   --   --   PROT 4.7*  --  5.0*  --  4.8*  --   --   --   --   ALBUMIN 2.2*  < > 1.8*  < > 1.7* 1.5* 1.6* 1.6* 1.5*  < > = values in this interval not displayed.   INFECTIOUS No results found for this basename: LATICACIDVEN, PROCALCITON,  in the last 168 hours   ENDOCRINE CBG (last 3)   Recent Labs  08/13/13 2327 08/14/13 0347 08/14/13 0736  GLUCAP 161* 153* 146*         IMAGING x48h  Ct Abdomen Pelvis Wo Contrast  08/13/2013   CLINICAL DATA Patient hospitalized for planned repair of enterocolonic fistula. Hospital course complicated by ileus, renal failure and cardiopulmonary arrest. Increasing white blood cell count.  EXAM CT ABDOMEN AND PELVIS WITHOUT CONTRAST  TECHNIQUE Multidetector CT imaging of the abdomen and pelvis was performed following the standard protocol without intravenous contrast.  COMPARISON 08/06/2013  FINDINGS There are thick-walled irregular loops of small bowel left lower quadrant below the level of the left lower quadrant colostomy. In this location, there is an irregular defect extending from a  small bowel loop that connects with an extraluminal collection of contrast in the left  lower quadrant. Contrast trauma wrist collection then extends anteriorly and medially along a thin tract breaching the left anterior abdominal wall fascia, and extending to the midline, where collects along the midline incision. This fistula appears larger than it did previously, and the amount of contrast leaking into the fistula is increased.  The lower sigmoid colon is terminated in a Hartman's pouch. It abuts the posterior inferior margin of the extraluminal contrast collection and the abnormal left lower quadrant small bowel loops.  The left colon has been diverted into the left lower quadrant colostomy. The colon is mostly decompressed. There is no evidence of a bowel obstruction. There is no free air.  Right pleural effusion has mildly decreased in size from prior study. Left pleural effusion stable in lung base consolidation in coarse reticular opacity, which is most likely atelectasis, has mildly improved in the right lower lobe and right middle lobe in and relatively stable on the left.  Liver, spleen, gallbladder and pancreas are unremarkable. No bile duct dilation. No adrenal masses. There are bilateral renal masses with most likely cysts. No hydronephrosis. Normal ureters. Bladder is decompressed with Foley catheter. Dense atherosclerotic plaque is noted throughout a normal caliber abdominal aorta and its branch vessels.  No ascites.  There is diffuse subcutaneous soft tissue edema.  Degenerative changes are noted throughout the visualized spine. The bones are demineralized.  IMPRESSION 1. There is an enteric fistula leaking contrast into the left lower quadrant which then extends along the left anterior peritoneal cavity breeching the anterior abdominal wall fascia, collecting within the lower anterior abdominal wall incision. This has increased in size since the prior study in the degree of contrast leakage has increased. 2. Right pleural effusion has decreased in size from prior study and there is  less right lung base atelectasis. There has also than resolution of the small amount of ascites noted previously. 3. Multiple other chronic findings as detailed above which are stable. No other significant change from the prior exam.  SIGNATURE  Electronically Signed   By: Amie Portland M.D.   On: 08/13/2013 15:54   Dg Chest Port 1 View  08/13/2013   CLINICAL DATA:  Hypoxia  EXAM: PORTABLE CHEST - 1 VIEW  COMPARISON:  August 12, 2013  FINDINGS: Endotracheal tube tip is 4.1 cm above the carina. Right jugular catheter tip is in the superior vena cava near the cavoatrial junction. Port-A-Cath tip is in the superior vena cava. No pneumothorax.  There is cardiomegaly which is stable. There is consolidation in both lower lobes, more on the left than on the right. There is interstitial edema in the mid and lower lung zones bilaterally. There are small effusions bilaterally. No adenopathy.  IMPRESSION: Tube and catheter positions as described without pneumothorax. Consolidation in both lower lobes, more on the left than on the right. Suspect a degree of underlying congestive heart failure.   Electronically Signed   By: Bretta Bang M.D.   On: 08/13/2013 07:41      ASSESSMENT / PLAN:  PULMONARY A: Acute respiratory failure COPD without exacerbation LLL PNA on CT abd 08/13/13   - Failure to wean due to severe physical deconditioning, septick shock and poor mental status P:   Tracheostomy today (consented, risks explained)   CARDIOVASCULAR A:  Septic shock Cardiogenic shock S/p Cardiopulmonary arrest Acute on chronic systolic and diastolic CHF NSTEMI 2/23 [facility])   - still on  pressors  P:  Wean NE to off for MAP > 65 mmHg  RENAL A:   AKI, anuric Hyperkalemia, resolved Anasasrca   - still anuric. 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 Ileus Protein calorie malnutrition   - CT shows worsening LLQ  fistula; can explain rising WBC  P:   Await CT SUP: IV PPI Cont TPN Surgery following  HEMATOLOGIC A:   Anemia Mild thrombocytopenia P:  DVT px: SCDs PRBC for hgb < 7gm%  INFECTIOUS A:   Severe sepsis with septic shock UTI P:   Cx / abx as above   ENDOCRINE A:   Hyperglycemia P:   Cont SSI   NEUROLOGIC A:   Acute encephalopathy   - still only minimal responsive only but per RN occ follows commands P:   Goal RASS 0 to -1 Cont PRN fentanyl  GLOBAL  08/12/13 husband not at bedside but learned during multi disciplinary rounds that despite several health care providers educating him about trach, ltach he is unable to comprehend what this all means. However, it appears emotionally he wants her to continue to get care and is not interested in pure palliation and he is hopeful for a recovery. Therefore, willl offer trach  08/14/13: detailed goals of care with husband his sister: he is emotionally torn. He understands very poor prognosis but does not want to give up yet. Agreed to tracheostomy with time limited trial of few to several week; if no recovery then palliate  I have personally obtained history, examined patient, evaluated and interpreted laboratory and imaging results, reviewed medical records, formulated assessment / plan and placed orders.  CRITICAL CARE:  The patient is critically ill with multiple organ systems failure and requires high complexity decision making for assessment and support, frequent evaluation and titration of therapies, application of advanced monitoring technologies and extensive interpretation of multiple databases. Critical Care Time devoted to patient care services described in this note is 40 minutes.   Dr. Kalman ShanMurali Tinnie Kunin, M.D., [facility].C.C.P Pulmonary and Critical Care Medicine Staff Physician [facility] Pager: [phone], If no answer or between  15:00h - 7:00h: call [phone]  08/14/2013 12:01 PM

## 2013-08-14 NOTE — Progress Notes (Signed)
We have received a consult to assist with discharge planning needs. I spoke with unit RNCM to get an update on Ms. Leah [name]'s status, then attempted to connect with her family members. No members of the family were present when I stopped by and contact was not made by phone. We will follow to assess for needs and offer support as needed.   Gretta CoolZackary Eastin Swing, LCSW Assistant Director Clinical Social Work [phone]

## 2013-08-14 NOTE — Progress Notes (Addendum)
PARENTERAL NUTRITION CONSULT NOTE:  FOLLOW-UP  Pharmacy Consult:  TPN Indication: Suspect TF intolerance d/t to severe bowel edema  Allergies  Allergen Reactions  . Cephalexin Other (See Comments)    Unknown  . Codeine Nausea And Vomiting    Patient Measurements: Height: 5\' 1"  (154.9 cm) Weight: 139 lb 6.4 oz (63.231 kg) IBW/kg (Calculated) : 47.8  Usual Weight: 55 kg (this was admit weight at [facility] 2/13)  Vital Signs: Temp: 96.7 F (35.9 C) (03/11 0700) Temp src: Oral (03/11 0700) BP: 99/48 mmHg (03/11 1000) Pulse Rate: 93 (03/11 1015) Intake/Output from previous day: 03/10 0701 - 03/11 0700 In: 4430.8 [I.V.:1014.8; NG/GT:710; IV Piggyback:594; TPN:2112] Out: 5424 [Emesis/NG output:500; Drains:401]  Labs:  Recent Labs  08/12/13 0505 08/13/13 0507 08/14/13 0430  WBC 29.5* 37.5* 38.9*  HGB 8.1* 7.8* 7.8*  HCT 24.0* 23.9* 24.0*  PLT 99* 119* 108*  APTT 50* 46*  --      Recent Labs  08/12/13 0505  08/13/13 0507 08/13/13 1643 08/14/13 0430  NA 129*  < > 129* 127* 130*  K 3.4*  < > 4.6 4.5 4.6  CL 95*  < > 95* 93* 97  CO2 21  < > 21 21 21   GLUCOSE 227*  < > 200* 215* 177*  BUN 38*  < > 38* 35* 36*  CREATININE 0.58  < > 0.48* 0.46* 0.41*  CALCIUM 7.7*  < > 7.8* 7.9* 8.2*  MG 2.1  --  2.2  --  2.3  PHOS 1.5*  < > 2.1* 2.9 2.5  PROT 4.8*  --   --   --   --   ALBUMIN 1.7*  < > 1.6* 1.6* 1.5*  AST 714*  --   --   --   --   ALT 120*  --   --   --   --   ALKPHOS 119*  --   --   --   --   BILITOT 1.9*  --   --   --   --   PREALBUMIN 12.3*  --   --   --   --   TRIG 138  --   --   --   --   < > = values in this interval not displayed. Estimated Creatinine Clearance: 55 ml/min (by C-G formula based on Cr of 0.41).    Recent Labs  08/13/13 2327 08/14/13 0347 08/14/13 0736  GLUCAP 161* 153* 146*     Insulin Requirements in the past 24 hours:  23 units moderate SSI + 57 units regular insulin in TPN with CBGs 146-187  Assessment: 53 YOF admitted  07/19/13 to [facility] for planned repair of enterocolonic fistula. Post-op course complicated by ileus, renal failure, and cardiopulmonary arrest.  Patient transferred to [facility] on 08/24/2013.  GI: bowels are too edematous to tolerate TF and may trickle feed when volume status improves per Surgery. Baseline prealbumin was 15.6 now 12.3. TG 138. NG O/P 250>>500 up, PPI IV. Change in wound output to thick brown 396cc. 3/10 CT: Enteric fistula increased in size.  Endo: no hx DM - requiring significant amounts of insulin. CBGs much improved <200.   Lytes: Low Na 130 (stable) and Phos 2.5 today after KPhos . Ca 8.2 adjusts to 10.2. Ca/Phos product 25.5 ok.  Renal: hx CKD started on CRRT on 08/08/13 for volume regulation - SCr/BUN improving, up 60-70lbs since admit with diffuse anasarca (55kg at [place], initial weight at [facility] was 86kg, now at 67.7kg).  NS at 10 ml/hr. Minimal to no UOP. TPN providing 2 g/kg/day of protein while on CRRT.  [facility] - FiO2 30% (stable)  Cards: AoC dHF (EF 35-40%), [facility].  Norepi drip increasing rate.  Hepatobil: AST 899>>714, ALT 86>>120, Tbili 1.9 no change. Recheck in AM.  Neuro: opens eyes to her name reported.  ID: Primaxin for intra-abd infxn + Enterococcus UTI, afebrile, WBC up to 38.9.  Best Practices: PPI IV, MC, SCDs on patient.  TPN Access: CVC placed 08/31/2013  TPN day#: [facility]   Current Nutrition:  - TPN at 6583ml/hr and Lipids 5410ml/hr on MWF provides a daily average of 1620 kCal and 100gm protein daily.  Nutritional Goals:  1612 kCal (goal for permissive underfeeding ~1200 kCal); protein 115 gm per day   Plan:  - Continue Clinimix E 5/15 (ADDed electrolytes) at 83 ml/hr + lipids at 10 ml/hr on MWF only to minimize kCal provision. - TPN also provides ~2L fluid per day. Renal aware. - Daily IV multivitamin in TPN and trace elements M/W/F with national shortage - Increase insulin  slightly up in TPN.    Devine Dant S. Merilynn Finlandobertson, PharmD, [facility] Clinical Staff Pharmacist Pager [phone]  08/14/2013, 10:31 AM

## 2013-08-14 NOTE — Progress Notes (Signed)
NUTRITION FOLLOW UP  Intervention:   TPN dosing per Pharmacy to meet 100% of re-estimated calorie and protein needs as able.  May be unable to meet protein needs without exceeding calorie goals due to premixed Clinimix solution.  Nutrition Dx:   Inadequate oral intake related to inability to eat as evidenced by NPO status. Ongoing.  Goal:   Intake to meet >90% of estimated nutrition needs. Unmet due to pre-mixed Clinimix solution.  Monitor:   TPN tolerance/adequacy, weight trend, labs, vent status, ability to transition to enteral nutrition.  Assessment:   Patient is a 73 y/o F [facility] for planned repair of enterocolonic fistula. S/P exploratory laparotomy with SB resection on 2/13. Course complicated by ileus, renal failure, cardiopulmonary arrest. [facility].   Nutrition focused physical exam completed.  No muscle or subcutaneous fat depletion noticed. Weight down a lot with fluid removal. Still receiving CRRT. S/P bronchoscopy and tracheostomy earlier today. Per surgery note, CT scan from 3/10 shows persistent enterocutaneous fistula which is larger than previous study. No abscess identified. Still unable to feed enterally.  Patient is receiving TPN with Clinimix E 5/15 @ 83 ml/hr.  Lipids (20% IVFE @ 10 ml/hr) are provided 3 times weekly (MWF).  Provides 1620 kcal and 100 grams protein daily (based on weekly average).  Meets >100% minimum estimated kcal and 87% minimum estimated protein needs.  Patient is currently intubated on ventilator support.  MV: 15.5 L/min Temp (24hrs), Avg:95.8 F (35.4 C), Min:93.6 F (34.2 C), Max:97.5 F (36.4 C)   Height: Ht Readings from Last 1 Encounters:  02/06/14 5\' 1"  (1.549 m)    Weight Status:  Trending down with negative fluid status. Wt Readings from Last 1 Encounters:  08/14/13 139 lb 6.4 oz (63.231 kg)  08/08/13  190 lb 14.7 oz (86.6 kg)   Body mass index is 26.35 kg/(m^2). no  longer meets criteria for obesity, no longer need to permissively underfeed.  Re-estimated needs:  Kcal: 1393 Protein: 115 gm Fluid: 1.4 L  Skin: abdominal incision  Diet Order:  NPO   Intake/Output Summary (Last 24 hours) at 08/14/13 1457 Last data filed at 08/14/13 1400  Gross per 24 hour  Intake 3177.33 ml  Output   4302 ml  Net -1124.67 ml    Last BM: 3/11   Labs:   Recent Labs Lab 08/12/13 0505  08/13/13 0507 08/13/13 1643 08/14/13 0430  NA 129*  < > 129* 127* 130*  K 3.4*  < > 4.6 4.5 4.6  CL 95*  < > 95* 93* 97  CO2 21  < > 21 21 21   BUN 38*  < > 38* 35* 36*  CREATININE 0.58  < > 0.48* 0.46* 0.41*  CALCIUM 7.7*  < > 7.8* 7.9* 8.2*  MG 2.1  --  2.2  --  2.3  PHOS 1.5*  < > 2.1* 2.9 2.5  GLUCOSE 227*  < > 200* 215* 177*  < > = values in this interval not displayed.  CBG (last 3)   Recent Labs  08/13/13 2327 08/14/13 0347 08/14/13 0736  GLUCAP 161* 153* 146*    Scheduled Meds: . antiseptic oral rinse  15 mL Mouth Rinse QID  . chlorhexidine  15 mL Mouth Rinse BID  . imipenem-cilastatin  250 mg Intravenous 3 times per day  . insulin aspart  0-15 Units Subcutaneous 6 times per day  . pantoprazole (PROTONIX) IV  40 mg Intravenous Q24H  Continuous Infusions: . Marland KitchenTPN (CLINIMIX-E) Adult 83 mL/hr at 08/13/13 1900  . Marland KitchenTPN (CLINIMIX-E) Adult    . sodium chloride 10 mL/hr at 08/08/13 0000  . fat emulsion    . heparin 10,000 units/ 20 mL infusion syringe Stopped (08/10/13 1321)  . norepinephrine (LEVOPHED) Adult infusion 40 mcg/min (08/14/13 1345)  . dialysis replacement fluid (prismasate) 200 mL/hr at 08/14/13 1240  . dialysis replacement fluid (prismasate) 200 mL/hr at 08/14/13 1226  . dialysate (PRISMASATE) 1,000 mL/hr at 08/14/13 0844     Joaquin Courts, RD, LDN, CNSC Pager [phone] After Hours Pager [phone]

## 2013-08-14 NOTE — Progress Notes (Signed)
Subjective: Remains on vent, critically ill  Objective: Vital signs in last 24 hours: Temp:  [93.6 F (34.2 C)-97.3 F (36.3 C)] 96.7 F (35.9 C) (03/11 0700) Pulse Rate:  [68-101] 83 (03/11 0700) Resp:  [19-38] 34 (03/11 0700) BP: (92-129)/(35-60) 100/39 mmHg (03/11 0700) SpO2:  [92 %-99 %] 95 % (03/11 0700) Arterial Line BP: (92-134)/(35-48) 95/35 mmHg (03/11 0700) FiO2 (%):  [30 %] 30 % (03/11 0700) Weight:  [139 lb 6.4 oz (63.231 kg)] 139 lb 6.4 oz (63.231 kg) (03/11 0457) Last BM Date:  (colostomy)  Intake/Output from previous day: 03/10 0701 - 03/11 0700 In: 4430.8 [I.V.:1014.8; NG/GT:710; IV Piggyback:594; OZH:0865] Out: 5424 [Emesis/NG output:500; Drains:401] Intake/Output this shift:    Abdomen tender, ostomy pink, midline lower wound with bilious drainage  Lab Results:   Recent Labs  08/13/13 0507 08/14/13 0430  WBC 37.5* 38.9*  HGB 7.8* 7.8*  HCT 23.9* 24.0*  PLT 119* 108*   BMET  Recent Labs  08/13/13 1643 08/14/13 0430  NA 127* 130*  K 4.5 4.6  CL 93* 97  CO2 21 21  GLUCOSE 215* 177*  BUN 35* 36*  CREATININE 0.46* 0.41*  CALCIUM 7.9* 8.2*   PT/INR No results found for this basename: LABPROT, INR,  in the last 72 hours ABG  Recent Labs  08/11/13 1827 08/12/13 0325  PHART 7.233* 7.392  HCO3 22.1 22.0    Studies/Results: Ct Abdomen Pelvis Wo Contrast  08/13/2013   CLINICAL DATA Patient hospitalized for planned repair of enterocolonic fistula. Hospital course complicated by ileus, renal failure and cardiopulmonary arrest. Increasing white blood cell count.  EXAM CT ABDOMEN AND PELVIS WITHOUT CONTRAST  TECHNIQUE Multidetector CT imaging of the abdomen and pelvis was performed following the standard protocol without intravenous contrast.  COMPARISON 08/06/2013  FINDINGS There are thick-walled irregular loops of small bowel left lower quadrant below the level of the left lower quadrant colostomy. In this location, there is an irregular  defect extending from a small bowel loop that connects with an extraluminal collection of contrast in the left lower quadrant. Contrast trauma wrist collection then extends anteriorly and medially along a thin tract breaching the left anterior abdominal wall fascia, and extending to the midline, where collects along the midline incision. This fistula appears larger than it did previously, and the amount of contrast leaking into the fistula is increased.  The lower sigmoid colon is terminated in a Hartman's pouch. It abuts the posterior inferior margin of the extraluminal contrast collection and the abnormal left lower quadrant small bowel loops.  The left colon has been diverted into the left lower quadrant colostomy. The colon is mostly decompressed. There is no evidence of a bowel obstruction. There is no free air.  Right pleural effusion has mildly decreased in size from prior study. Left pleural effusion stable in lung base consolidation in coarse reticular opacity, which is most likely atelectasis, has mildly improved in the right lower lobe and right middle lobe in and relatively stable on the left.  Liver, spleen, gallbladder and pancreas are unremarkable. No bile duct dilation. No adrenal masses. There are bilateral renal masses with most likely cysts. No hydronephrosis. Normal ureters. Bladder is decompressed with Foley catheter. Dense atherosclerotic plaque is noted throughout a normal caliber abdominal aorta and its branch vessels.  No ascites.  There is diffuse subcutaneous soft tissue edema.  Degenerative changes are noted throughout the visualized spine. The bones are demineralized.  IMPRESSION 1. 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 This has increased in size since the prior study in the degree of  contrast leakage has increased. 2. Right pleural effusion has decreased in size from prior study and there is less right lung base atelectasis. There has also than resolution of the small amount of ascites noted previously. 3. Multiple other chronic findings as detailed above which are stable. No other significant change from the prior exam.  SIGNATURE  Electronically Signed   By: Amie Portlandavid  Ormond M.D.   On: 08/13/2013 15:54   Dg Chest Port 1 View  08/13/2013   CLINICAL DATA:  Hypoxia  EXAM: PORTABLE CHEST - 1 VIEW  COMPARISON:  August 12, 2013  FINDINGS: Endotracheal tube tip is 4.1 cm above the carina. Right jugular catheter tip is in the superior vena cava near the cavoatrial junction. Port-A-Cath tip is in the superior vena cava. No pneumothorax.  There is cardiomegaly which is stable. There is consolidation in both lower lobes, more on the left than on the right. There is interstitial edema in the mid and lower lung zones bilaterally. There are small effusions bilaterally. No adenopathy.  IMPRESSION: Tube and catheter positions as described without pneumothorax. Consolidation in both lower lobes, more on the left than on the right. Suspect a degree of underlying congestive heart failure.   Electronically Signed   By: Bretta BangWilliam  Woodruff M.D.   On: 08/13/2013 07:41    Anti-infectives: Anti-infectives   Start     Dose/Rate Route Frequency Ordered Stop   08/10/13 0600  imipenem-cilastatin (PRIMAXIN) 250 mg in sodium chloride 0.9 % 100 mL IVPB     250 mg 200 mL/hr over 30 Minutes Intravenous 3 times per day 08/09/13 0021     08/09/13 1000  imipenem-cilastatin (PRIMAXIN) 250 mg in sodium chloride 0.9 % 100 mL IVPB  Status:  Discontinued     250 mg 200 mL/hr over 30 Minutes Intravenous Every 12 hours 08/09/13 0019 08/09/13 0021   08/08/13 1000  micafungin (MYCAMINE) 100 mg in sodium chloride 0.9 % 100 mL IVPB  Status:  Discontinued     100 mg 100 mL/hr over 1 Hours Intravenous Daily 08/08/13 0950 08/08/13  1001   2013-10-23 1800  vancomycin (VANCOCIN) IVPB 1000 mg/200 mL premix  Status:  Discontinued     1,000 mg 200 mL/hr over 60 Minutes Intravenous Every 24 hours 2013-10-23 1625 08/11/13 1057   2013-10-23 1700  imipenem-cilastatin (PRIMAXIN) 250 mg in sodium chloride 0.9 % 100 mL IVPB  Status:  Discontinued     250 mg 200 mL/hr over 30 Minutes Intravenous 3 times per day 2013-10-23 1624 08/09/13 0019      Assessment/Plan: s/p * No surgery found *  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  No abscess identified. Currently, the drainage is being controlled at the skin level.  Any abdominal exploration would be extremely risky.  I suspect the bowel would be densely adherent and she would be high risk for bowel enterotomies.  LOS: 7 days    Leah [name] A 08/14/2013

## 2013-08-14 NOTE — Procedures (Signed)
Bronchoscopy  for Percutaneous  Tracheostomy  Name: Leah [name] MRN: [id number] DOB: 03/01/1941 Procedure: Bronchoscopy for Percutaneous Tracheostomy Indications: Diagnostic eDeveron Furlongvaluation of the airways In conjunction with: Dr. Molli KnockYacoub   Procedure Details Consent: Risks of procedure as well as the alternatives and risks of each were explained to the (patient/caregiver).  Consent for procedure obtained. Time Out: Verified patient identification, verified procedure, site/side was marked, verified correct patient position, special equipment/implants available, medications/allergies/relevent history reviewed, required imaging and test results available.  Performed  In preparation for procedure, patient was given 100% FiO2 and bronchoscope lubricated. Sedation: Benzodiazepines and Etomidate and neuromuscular blockade  Airway entered and the following bronchi were examined: RML.   Procedures performed: Endotracheal Tube retracted in 2 cm increments. Cannulation of airway observed. Dilation observed. Placement of trachel tube  observed . No overt complications. Bronchoscope removed.  , Patient placed back on 100% FiO2 at conclusion of procedure.    Evaluation Hemodynamic Status: Transient hypotension treated with pressors; O2 sats: stable throughout Patient's Current Condition: stable Specimens:  None Complications: No apparent complications Patient did tolerate procedure well.  Joneen RoachPaul Hoffman, ACNP [facility]/Critical [facility] Pager [phone] or [phone]  08/14/2013, 1:56 PM     STAFF NOTE  Supervised bronch for PDT. Gave DDAVP prior to procedure. This is a time limited trial  Dr. Kalman ShanMurali Adonnis Salceda, M.D., [facility].P Pulmonary and Critical Care Medicine Staff Physician [facility]monary and Critical Care Pager: [phone], If no answer or between  15:00h - 7:00h: call [phone]  08/14/2013 2:28 PM

## 2013-08-14 NOTE — Procedures (Signed)
Bedside Tracheostomy Insertion Procedure Note   Patient Details:   Name: Leah [name] DOB: 08/27/1940 MRN: [id number]  Procedure: Tracheostomy  Pre Procedure Assessment: ET Tube Size:7.0 ET Tube secured at lip (cm):18 Bite block in place: No Breath Sounds: Diminished  Post Procedure Assessment: BP 110/38  Pulse 98  Temp(Src) 97.5 F (36.4 C) (Oral)  Resp 32  Ht 5\' 1"  (1.549 m)  Wt 139 lb 6.4 oz (63.231 kg)  BMI 26.35 kg/m2  SpO2 96% O2 sats: stable throughout Complications: No apparent complications Patient did tolerate procedure well Tracheostomy Brand:Shiley Tracheostomy Style:Cuffed Tracheostomy Size: 6.0 Tracheostomy Secured ION:[id number]:Sutures Tracheostomy Placement Confirmation:Trach cuff visualized and in place and Chest X ray ordered for placement    Leonard DowningFerguson, Leah [name] 08/14/2013, 2:17 PM

## 2013-08-15 ENCOUNTER — Inpatient Hospital Stay (HOSPITAL_COMMUNITY): Payer: Medicare Other

## 2013-08-15 DIAGNOSIS — Z66 Do not resuscitate: Secondary | ICD-10-CM

## 2013-08-15 LAB — RENAL FUNCTION PANEL
ALBUMIN: 1.3 g/dL — AB (ref 3.5–5.2)
Albumin: 1.5 g/dL — ABNORMAL LOW (ref 3.5–5.2)
BUN: 36 mg/dL — ABNORMAL HIGH (ref 6–23)
BUN: 31 mg/dL — AB (ref 6–23)
CALCIUM: 7.8 mg/dL — AB (ref 8.4–10.5)
CALCIUM: 7.7 mg/dL — AB (ref 8.4–10.5)
CO2: 21 mEq/L (ref 19–32)
CO2: 21 mEq/L (ref 19–32)
CREATININE: 0.36 mg/dL — AB (ref 0.50–1.10)
Chloride: 97 mEq/L (ref 96–112)
Chloride: 97 mEq/L (ref 96–112)
Creatinine, Ser: 0.37 mg/dL — ABNORMAL LOW (ref 0.50–1.10)
GFR calc Af Amer: >90 mL/min (ref >90)
GFR calc Af Amer: >90 mL/min (ref >90)
GFR calc non Af Amer: >90 mL/min (ref >90)
Glucose, Bld: 192 mg/dL — ABNORMAL HIGH (ref 70–99)
Glucose, Bld: 199 mg/dL — ABNORMAL HIGH (ref 70–99)
PHOSPHORUS: 2.4 mg/dL (ref 2.3–4.6)
Phosphorus: 2.4 mg/dL (ref 2.3–4.6)
Potassium: 4.4 mEq/L (ref 3.7–5.3)
Potassium: 4.3 mEq/L (ref 3.7–5.3)
Sodium: 132 mEq/L — ABNORMAL LOW (ref 137–147)
Sodium: 130 mEq/L — ABNORMAL LOW (ref 137–147)

## 2013-08-15 LAB — MAGNESIUM: MAGNESIUM: 2.3 mg/dL (ref 1.5–2.5)

## 2013-08-15 LAB — TROPONIN I: Troponin I: <0.3 ng/mL (ref <0.30)

## 2013-08-15 LAB — CBC WITH DIFFERENTIAL/PLATELET
BASOS ABS: 0.7 10*3/uL — AB (ref 0.0–0.1)
Basophils Relative: 2 % — ABNORMAL HIGH (ref 0–1)
EOS ABS: 0.7 10*3/uL (ref 0.0–0.7)
Eosinophils Relative: 2 % (ref 0–5)
HCT: 22.8 % — ABNORMAL LOW (ref 36.0–46.0)
Hemoglobin: 7.4 g/dL — ABNORMAL LOW (ref 12.0–15.0)
LYMPHS PCT: 7 % — AB (ref 12–46)
Lymphs Abs: 2.4 10*3/uL (ref 0.7–4.0)
MCH: 30.8 pg (ref 26.0–34.0)
MCHC: 32.5 g/dL (ref 30.0–36.0)
MCV: 95 fL (ref 78.0–100.0)
MONOS PCT: 4 % (ref 3–12)
Monocytes Absolute: 1.3 10*3/uL — ABNORMAL HIGH (ref 0.1–1.0)
NEUTROS PCT: 85 % — AB (ref 43–77)
Neutro Abs: 28.6 10*3/uL — ABNORMAL HIGH (ref 1.7–7.7)
PLATELETS: 81 10*3/uL — AB (ref 150–400)
RBC: 2.4 MIL/uL — ABNORMAL LOW (ref 3.87–5.11)
RDW: 22.3 % — ABNORMAL HIGH (ref 11.5–15.5)
WBC Morphology: INCREASED
WBC: 33.7 10*3/uL — AB (ref 4.0–10.5)

## 2013-08-15 LAB — GLUCOSE, CAPILLARY
Glucose-Capillary: 131 mg/dL — ABNORMAL HIGH (ref 70–99)
Glucose-Capillary: 151 mg/dL — ABNORMAL HIGH (ref 70–99)
Glucose-Capillary: 155 mg/dL — ABNORMAL HIGH (ref 70–99)
Glucose-Capillary: 157 mg/dL — ABNORMAL HIGH (ref 70–99)
Glucose-Capillary: 158 mg/dL — ABNORMAL HIGH (ref 70–99)
Glucose-Capillary: 170 mg/dL — ABNORMAL HIGH (ref 70–99)

## 2013-08-15 LAB — PROCALCITONIN: Procalcitonin: 4.91 ng/mL

## 2013-08-15 LAB — LACTIC ACID, PLASMA: LACTIC ACID, VENOUS: 2.8 mmol/L — AB (ref 0.5–2.2)

## 2013-08-15 MED ORDER — PHENYLEPHRINE HCL 10 MG/ML IJ SOLN
30.0000 ug/min | INTRAMUSCULAR | Status: DC
  Administered 2013-08-15 (×2): 200 ug/min via INTRAVENOUS
  Administered 2013-08-15: 50 ug/min via INTRAVENOUS
  Administered 2013-08-15: 125 ug/min via INTRAVENOUS
  Administered 2013-08-16: 100 ug/min via INTRAVENOUS
  Administered 2013-08-16 – 2013-08-17 (×3): 200 ug/min via INTRAVENOUS
  Administered 2013-08-17 (×2): 100 ug/min via INTRAVENOUS
  Administered 2013-08-17 (×2): 200 ug/min via INTRAVENOUS
  Filled 2013-08-15 (×14): qty 4

## 2013-08-15 MED ORDER — SODIUM CHLORIDE 0.9 % IV BOLUS (SEPSIS)
1000.0000 mL | Freq: Once | INTRAVENOUS | Status: AC
  Administered 2013-08-15: 1000 mL via INTRAVENOUS

## 2013-08-15 MED ORDER — VASOPRESSIN 20 UNIT/ML IJ SOLN
0.0300 [IU]/min | INTRAMUSCULAR | Status: DC
  Administered 2013-08-15 – 2013-08-22 (×6): 0.03 [IU]/min via INTRAVENOUS
  Filled 2013-08-15 (×8): qty 2.5

## 2013-08-15 MED ORDER — SODIUM CHLORIDE 0.9 % IV SOLN
100.0000 mg | Freq: Every day | INTRAVENOUS | Status: DC
  Administered 2013-08-15 – 2013-08-23 (×9): 100 mg via INTRAVENOUS
  Filled 2013-08-15 (×10): qty 100

## 2013-08-15 MED ORDER — M.V.I. ADULT IV INJ
INTRAVENOUS | Status: AC
  Administered 2013-08-15: 17:00:00 via INTRAVENOUS
  Filled 2013-08-15: qty 2000

## 2013-08-15 NOTE — Progress Notes (Signed)
Chaplain was paged to be with family of pt.  Chaplain provided spiritual and emotional support as well as prayer.  Family seemed grateful for the chaplain presence.  Follow up available if needed or requested.

## 2013-08-15 NOTE — Progress Notes (Signed)
PULMONARY / CRITICAL CARE MEDICINE   Name: Leah [name] MRN: [id number] DOB: 05/06/1941    ADMISSION DATE:  08/25/2013  [facility]  PRIMARY SERVICE: PCCM  CHIEF COMPLAINT:  Respiratory Failure   BRIEF PATIENT DESCRIPTION: 73 y[facility] for planned repair of enterocolonic fistula.  Course was complicated by ileus, renal failure, cardiopulmonary arrest.  [facility]   LINES / TUBES: R IJ TLC 2/14 >> 3/04 ETT([facility])  2/23 >> 08/14/13, 3/11 (trach by JY) >> R IJ HD cath 3/04 >>  L IJ TLC 3/04 >>    CULTURES: ?date [facility] >>> ENTEROCOCCUS FAECALIS 3/4 MRSA PCR >>> neg 3/4 Urine >>>  Multiple bacterial morphotypes  3/5 Blood >>  neg     ANTIBIOTICS: Vancomycin 3/4 >> 3/08 Imipenem 3/4 >>  Mycafungin 3/12 >>  SIGNIFICANT EVENTS / STUDIES:  2/13  OR >>> Ex-lap with small bowel resection, extensive lysis of adhesions, vomiting 2/22  Cardiac arrest - VDRF/Intubated 09/02/2013 - ADMIT [facility] 3/05  TTE: EF 35-40, grade 2 diastolic dysfunction  3/05  CVVHD started 3/8 - No new issues. Tolerates PS 10 cm H2O 08/12/13: 40# off since [facility] with CR[facility]. Still anuric. Patient is DNAR but full medical care. On levophed 11mcg.. Family at bedside 08/13/13: No sedation but on levophed 20mcg. RN says occ opens eyes but unresponsive to this MD (last fentanyl was 1aM)  08/14/13: For Trach today. WC upa at 40; worsening LLQ enteric fistula iwtih collection on CT; high risk for re-exploration per CCS. Still on levophed at 28mcg. On CRRT. Is overbreathing vent; opens eyes    SUBJECTIVE/OVERNIGHT/INTERVAL HX   08/15/13: s/p trach now. Worsening shock; 50mcg levophed and 100mcg neo. LEss responsive. CCS thinks only option for fistula is drainage through ostomy bag.  WC high  VITAL SIGNS: Temp:  [94 F (34.4 C)-97.5 F (36.4 C)] 94.1 F (34.5 C) (03/12 0800) Pulse Rate:  [63-105] 95 (03/12  1100) Resp:  [21-42] 36 (03/12 1100) BP: (84-129)/(29-65) 122/58 mmHg (03/12 1100) SpO2:  [90 %-100 %] 98 % (03/12 1100) Arterial Line BP: (79-139)/(29-59) 111/41 mmHg (03/12 1100) FiO2 (%):  [30 %-100 %] 30 % (03/12 1100) Weight:  [63 kg (138 lb 14.2 oz)] 63 kg (138 lb 14.2 oz) (03/12 0400)  HEMODYNAMICS: CVP:  [4 mmHg-5 mmHg] 5 mmHg VENTILATOR SETTINGS: Vent Mode:  [-] PRVC FiO2 (%):  [30 %-100 %] 30 % Set Rate:  [16 bmp] 16 bmp Vt Set:  [380 mL] 380 mL PEEP:  [5 cmH20] 5 cmH20 Plateau Pressure:  [12 cmH20-18 cmH20] 18 cmH20  INTAKE / OUTPUT: Intake/Output     03/11 0701 - 03/12 0700 03/12 0701 - 03/13 0700   I.V. (mL/kg) 1072.1 (17) 236.3 (3.8)   NG/GT     IV Piggyback 350    TPN 2119.8 372   Total Intake(mL/kg) 3541.9 (56.2) 608.3 (9.7)   Urine (mL/kg/hr) 5 (0)    Emesis/NG output 250 (0.2)    Drains 95 (0.1)    Other 3394 (2.2) 220 (0.8)   Total Output 3744 220   Net -202.1 +388.3         PHYSICAL EXAMINATION: General:  Very deconditioned. Critically ill looking Neuro: Diffusely weak, no focal deficits, RASS -3 on no sedation; worse, blinked eyes to voice command HEENT: WNL Cardiovascular:  RRR s M Lungs: clear anteriorly Abdomen:  BS absent, LLQ colostomy, RLQ fistula draining serous fluid Ext:  2+ symmetric UE and LE edema   PULMONARY  Recent Labs Lab 08/11/13 1827 08/12/13 0325  PHART 7.233* 7.392  PCO2ART 53.9* 36.8  PO2ART 96.8 73.9*  HCO3 22.1 22.0  TCO2 23.8 23.2  O2SAT 97.0 95.6    CBC  Recent Labs Lab 08/13/13 0507 08/14/13 0430 08/15/13 0440  HGB 7.8* 7.8* 7.4*  HCT 23.9* 24.0* 22.8*  WBC 37.5* 38.9* 33.7*  PLT 119* 108* 81*    COAGULATION  Recent Labs Lab 08/14/13 1210  INR 1.16    CARDIAC  No results found for this basename: TROPONINI,  in the last 168 hours No results found for this basename: PROBNP,  in the last 168 hours   CHEMISTRY  Recent Labs Lab 08/11/13 0512  08/12/13 0505  08/13/13 0507 08/13/13 1643  08/14/13 0430 08/14/13 1519 08/15/13 0440  NA 131*  < > 129*  < > 129* 127* 130* 131* 132*  K 3.8  < > 3.4*  < > 4.6 4.5 4.6 4.5 4.4  CL 96  < > 95*  < > 95* 93* 97 97 97  CO2 22  < > 21  < > 21 21 21 23 21   GLUCOSE 203*  < > 227*  < > 200* 215* 177* 162* 192*  BUN 40*  < > 38*  < > 38* 35* 36* 36* 36*  CREATININE 0.84  < > 0.58  < > 0.48* 0.46* 0.41* 0.40* 0.37*  CALCIUM 7.7*  < > 7.7*  < > 7.8* 7.9* 8.2* 8.1* 7.8*  MG 2.4  --  2.1  --  2.2  --  2.3  --  2.3  PHOS  --   < > 1.5*  < > 2.1* 2.9 2.5 2.7 2.4  < > = values in this interval not displayed. Estimated Creatinine Clearance: 54.9 ml/min (by C-G formula based on Cr of 0.37).   LIVER  Recent Labs Lab 08/11/13 0512  08/12/13 0505  08/13/13 0507 08/13/13 1643 08/14/13 0430 08/14/13 1210 08/14/13 1519 08/15/13 0440  AST 899*  --  714*  --   --   --   --   --   --   --   ALT 86*  --  120*  --   --   --   --   --   --   --   ALKPHOS 120*  --  119*  --   --   --   --   --   --   --   BILITOT 1.9*  --  1.9*  --   --   --   --   --   --   --   PROT 5.0*  --  4.8*  --   --   --   --   --   --   --   ALBUMIN 1.8*  < > 1.7*  < > 1.6* 1.6* 1.5*  --  1.6* 1.5*  INR  --   --   --   --   --   --   --  1.16  --   --   < > = values in this interval not displayed.   INFECTIOUS  Recent Labs Lab 08/14/13 1207 08/15/13 0440  PROCALCITON 4.38 4.91     ENDOCRINE CBG (last 3)   Recent Labs  08/14/13 2350 08/15/13 0416 08/15/13 0818  GLUCAP 157* 158* 151*         IMAGING x48h  Ct Abdomen Pelvis Wo Contrast  08/13/2013   CLINICAL DATA Patient hospitalized for planned repair of enterocolonic fistula. Hospital course complicated by ileus, renal failure and cardiopulmonary arrest. Increasing white blood cell count.  EXAM CT ABDOMEN AND PELVIS WITHOUT CONTRAST  TECHNIQUE Multidetector CT imaging of the abdomen and pelvis was performed following the standard protocol without intravenous contrast.  COMPARISON 08/06/2013   FINDINGS There are thick-walled irregular loops of small bowel left lower quadrant below the level of the left lower quadrant colostomy. In this location, there is an irregular defect extending from a small bowel loop that connects with an extraluminal collection of contrast in the left lower quadrant. Contrast trauma wrist collection then extends anteriorly and medially along a thin tract breaching the left anterior abdominal wall fascia, and extending to the midline, where collects along the midline incision. This fistula appears larger than it did previously, and the amount of contrast leaking into the fistula is increased.  The lower sigmoid colon is terminated in a Hartman's pouch. It abuts the posterior inferior margin of the extraluminal contrast collection and the abnormal left lower quadrant small bowel loops.  The left colon has been diverted into the left lower quadrant colostomy. The colon is mostly decompressed. There is no evidence of a bowel obstruction. There is no free air.  Right pleural effusion has mildly decreased in size from prior study. Left pleural effusion stable in lung base consolidation in coarse reticular opacity, which is most likely atelectasis, has mildly improved in the right lower lobe and right middle lobe in and relatively stable on the left.  Liver, spleen, gallbladder and pancreas are unremarkable. No bile duct dilation. No adrenal masses. There are bilateral renal masses with most likely cysts. No hydronephrosis. Normal ureters. Bladder is decompressed with Foley catheter. Dense atherosclerotic plaque is noted throughout a normal caliber abdominal aorta and its branch vessels.  No ascites.  There is diffuse subcutaneous soft tissue edema.  Degenerative changes are noted throughout the visualized spine. The bones are demineralized.  IMPRESSION 1. There is an enteric fistula leaking contrast into the left lower quadrant which then extends along the left anterior peritoneal  cavity breeching the anterior abdominal wall fascia, collecting within the lower anterior abdominal wall incision. This has increased in size since the prior study in the degree of contrast leakage has increased. 2. Right pleural effusion has decreased in size from prior study and there is less right lung base atelectasis. There has also than resolution of the small amount of ascites noted previously. 3. Multiple other chronic findings as detailed above which are stable. No other significant change from the prior exam.  SIGNATURE  Electronically Signed   By: Amie Portland M.D.   On: 08/13/2013 15:54   Dg Chest Port 1 View  08/15/2013   CLINICAL DATA Shortness of breath.  Intubation.  EXAM PORTABLE CHEST - 1 VIEW  COMPARISON DG CHEST 1V PORT dated 08/14/2013  FINDINGS Tracheostomy tube, NG tube, right IJ line, left IJ line in stable position. Prior CABG. Cardiac size and pulmonary vascularity stable. Bilateral pulmonary alveolar infiltrates are again noted. Atelectatic changes both lung bases.  IMPRESSION 1. Stable line and tube positions . 2. Persistent unchanged bilateral pulmonary alveolar infiltrates with basilar atelectasis. 3. Prior CABG.  Cardiac size normal.  SIGNATURE  Electronically Signed   By: Maisie Fus  Register   On: 08/15/2013 07:28   Chest Portable 1 View To Assess Tube Placement And Rule-out Pneumothorax  08/14/2013   CLINICAL DATA Tracheostomy placement  EXAM PORTABLE CHEST -  1 VIEW  COMPARISON 08/13/2013  FINDINGS Tracheostomy has been placed in good position.  No pneumothorax  Bilateral jugular central venous catheter tips in the SVC, unchanged.  Mild improvement in bibasilar atelectasis/infiltrate.  IMPRESSION Satisfactory tracheostomy placement without pneumothorax.  Mild improvement in bibasilar airspace disease.  SIGNATURE  Electronically Signed   By: Marlan Palau M.D.   On: 08/14/2013 15:10      ASSESSMENT / PLAN:  PULMONARY A: Acute and chronic respiratory failure with prolonged  mech vent/chronic critical illness  - s/p trach 08/14/13 COPD without exacerbation LLL PNA on CT abd 08/13/13   - Failure to wean due to severe physical deconditioning, septick shock and poor mental status P:   Full vent supprt   CARDIOVASCULAR A:  Septic shock Cardiogenic shock S/p Cardiopulmonary arrest Acute on chronic systolic and diastolic CHF NSTEMI 2/23 [facility])   -worsening shock, CVP 5. CRRT running even  P:  Fluid bolus Wean NE to off for MAP > 65 mmHg  RENAL A:   AKI, anuric Hyperkalemia, resolved An[place]rca   - still anuric. 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 Severe Ileus Protein calorie malnutrition   - CT shows worsening LLQ fistula; can explain rising WBC  P:   SUP: IV PPI Cont TPN Surgery following  HEMATOLOGIC A:   Anemia Mild thrombocytopenia P:  DVT px: SCDs PRBC for hgb < 7gm%  INFECTIOUS A:   Severe sepsis with septic shock persists in setting of complicated GI surgery UTI P:   Cx / abx as above Add mycafungin   ENDOCRINE A:   Hyperglycemia P:   Cont SSI   NEUROLOGIC A:   Acute encephalopathy   - still only minimal responsive only but per RN occ follows commands P:   Goal RASS 0 to -1 Cont PRN fentanyl CT head  GLOBAL   08/14/13: detailed goals of care with husband his sister: he is emotionally torn. He understands very poor prognosis but does not want to give up yet. Agreed to tracheostomy with time limited trial of few to several week; if no recovery then palliate  08/15/13: worsening shock, likely septic. Add myafungin. Give fluids. Explained to husband she is worse. Prepared him for possible death if she does not turn around  I have personally obtained history, examined patient, evaluated and interpreted laboratory and imaging results, reviewed medical records, formulated assessment / plan and placed orders.  CRITICAL CARE:  The patient  is critically ill with multiple organ systems failure and requires high complexity decision making for assessment and support, frequent evaluation and titration of therapies, application of advanced monitoring technologies and extensive interpretation of multiple databases. Critical Care Time devoted to patient care services described in this note is 40 minutes.   Dr. Kalman Shan, M.D., [facility].C.P Pulmonary and Critical Care Medicine Staff Physician [facility] [place] Pulmonary and Critical Care Pager: [phone], If no answer or between  15:00h - 7:00h: call [phone]  08/15/2013 11:27 AM

## 2013-08-15 NOTE — Progress Notes (Signed)
eLink Physician-Brief Progress Note Patient Name: Leah FurlongSandra K Lando DOB: 02/23/1941 MRN: [id number]  Date of Service  08/15/2013   HPI/Events of Note   Worsening shock  eICU Interventions  vasopressin   Intervention Category Major Interventions: Hypotension - evaluation and management  Kelyn Koskela 08/15/2013, 7:31 PM

## 2013-08-15 NOTE — Progress Notes (Signed)
PARENTERAL NUTRITION CONSULT NOTE:  FOLLOW-UP  Pharmacy Consult:  TPN Indication: Suspect TF intolerance d/t to severe bowel edema  Allergies  Allergen Reactions  . Cephalexin Other (See Comments)    Unknown  . Codeine Nausea And Vomiting    Patient Measurements: Height: 5\' 1"  (154.9 cm) Weight: 138 lb 14.2 oz (63 kg) IBW/kg (Calculated) : 47.8  Usual Weight: 55 kg (this was [facility])  Vital Signs: Temp: 94.1 F (34.5 C) (03/12 0800) Temp src: Axillary (03/12 0800) BP: 99/43 mmHg (03/12 0900) Pulse Rate: 85 (03/12 0900) Intake/Output from previous day: 03/11 0701 - 03/12 0700 In: 3541.9 [I.V.:1072.1; IV Piggyback:350; TPN:2119.8] Out: 3744 [Urine:5; Emesis/NG output:250; Drains:95]  Labs:  Recent Labs  08/13/13 0507 08/14/13 0430 08/14/13 1210 08/15/13 0440  WBC 37.5* 38.9*  --  33.7*  HGB 7.8* 7.8*  --  7.4*  HCT 23.9* 24.0*  --  22.8*  PLT 119* 108*  --  81*  APTT 46*  --  51*  --   INR  --   --  1.16  --      Recent Labs  08/13/13 0507  08/14/13 0430 08/14/13 1519 08/15/13 0440  NA 129*  < > 130* 131* 132*  K 4.6  < > 4.6 4.5 4.4  CL 95*  < > 97 97 97  CO2 21  < > 21 23 21   GLUCOSE 200*  < > 177* 162* 192*  BUN 38*  < > 36* 36* 36*  CREATININE 0.48*  < > 0.41* 0.40* 0.37*  CALCIUM 7.8*  < > 8.2* 8.1* 7.8*  MG 2.2  --  2.3  --  2.3  PHOS 2.1*  < > 2.5 2.7 2.4  ALBUMIN 1.6*  < > 1.5* 1.6* 1.5*  < > = values in this interval not displayed. Estimated Creatinine Clearance: 54.9 ml/min (by C-G formula based on Cr of 0.37).    Recent Labs  08/14/13 2350 08/15/13 0416 08/15/13 0818  GLUCAP 157* 158* 151*     Insulin Requirements in the past 24 hours:  17 units moderate SSI + 67 units regular insulin in TPN with CBGs 128-158  Assessment: [age] YOF [facility] for planned repair of enterocolonic fistula. Post-op course complicated by ileus, renal failure, and cardiopulmonary arrest.  [facility].  GI: bowels are too edematous to tolerate TF and may trickle feed when volume status improves per Surgery. Baseline prealbumin was 15.6 now 12.3. TG 138. NG O/P 250>>500>>250, PPI IV. Change in wound output to thick brown. 3/10 CT: Enteric fistula increased in size. Drains 120.Abdomen with open wound and bilious drainage from fistula   Endo: no hx DM - requiring significant amounts of insulin. CBGs much improved now <200.   Lytes: Low Na 132 (stable) and Phos 2.4 today replaced. Ca 7.8 adjusts to 9.8. Ca/Phos product 23.5 ok.  Renal: hx CKD started on CRRT on 08/08/13 for volume regulation - SCr/BUN improving, up 60-70lbs since admit with diffuse anasarca ([facility], [facility], now at 63kg).  NS at 10 ml/hr. Minimal to no UOP. TPN providing 2 g/kg/day of protein while on CRRT. Keeping even.  Pulm: [facility] - FiO2 30% (stable). Now trached  Cards: AoC dHF (EF 35-40%), [facility].  Norepi drip at still increasing rate.  Hepatobil: AST 899>>714, ALT 86>>120, Tbili 1.9 no change.CMET not done today--changed by MD. Hennie Duosecheck tomorrow.  Neuro:  opens eyes to her name reported.  ID: Primaxin for intra-abd infxn + Enterococcus UTI, afebrile, WBC up to 38.9.  Best Practices: PPI IV, MC, SCDs on patient.  TPN Access: CVC placed 08/24/2013  TPN day#: PTA from [place] since 2/27   Current Nutrition:  - TPN at 41ml/hr and Lipids 39ml/hr on Mon/Fri (decrease to 2d per week to prevent essential FA def) provides an average of 1551 kCal and 100gm protein daily.  Nutritional Goals:  1393 kCal (goal for permissive underfeeding ~1200 kCal); protein 115 gm per day- RD note 3/11   Plan:  - Continue Clinimix E 5/15 (ADDed electrolytes) at 83 ml/hr + lipids at 10 ml/hr on Mon/Fri only to minimize kCal provision. - TPN also provides ~2L fluid per day. Renal aware. - Daily IV multivitamin in TPN and trace elements M/W/F with national  shortage - Check LFTs on CMET in am    Elyna Pangilinan S. Merilynn Finland, PharmD, BCPS Clinical Staff Pharmacist Pager [phone]  08/15/2013, 9:30 AM

## 2013-08-15 NOTE — Progress Notes (Signed)
ANTIBIOTIC CONSULT NOTE - FOLLOW UP  Pharmacy Consult for Imipenem Indication: intraabdominal infection  Allergies  Allergen Reactions  . Cephalexin Other (See Comments)    Unknown  . Codeine Nausea And Vomiting    Patient Measurements: Height: 5\' 1"  (154.9 cm) Weight: 138 lb 14.2 oz (63 kg) IBW/kg (Calculated) : 47.8  Vital Signs: Temp: 94.1 F (34.5 C) (03/12 0800) Temp src: Axillary (03/12 0800) BP: 122/58 mmHg (03/12 1100) Pulse Rate: 95 (03/12 1100) Intake/Output from previous day: 03/11 0701 - 03/12 0700 In: 3541.9 [I.V.:1072.1; IV Piggyback:350; TPN:2119.8] Out: 3744 [Urine:5; Emesis/NG output:250; Drains:95] Intake/Output from this shift: Total I/O In: 608.3 [I.V.:236.3; TPN:372] Out: 220 [Other:220]  Labs:  Recent Labs  08/13/13 0507  08/14/13 0430 08/14/13 1519 08/15/13 0440  WBC 37.5*  --  38.9*  --  33.7*  HGB 7.8*  --  7.8*  --  7.4*  PLT 119*  --  108*  --  81*  CREATININE 0.48*  < > 0.41* 0.40* 0.37*  < > = values in this interval not displayed. Estimated Creatinine Clearance: 54.9 ml/min (by C-G formula based on Cr of 0.37). No results found for this basename: VANCOTROUGH, Leodis BinetVANCOPEAK, VANCORANDOM, GENTTROUGH, GENTPEAK, GENTRANDOM, TOBRATROUGH, TOBRAPEAK, TOBRARND, AMIKACINPEAK, AMIKACINTROU, AMIKACIN,  in the last 72 hours   Microbiology: Recent Results (from the past 720 hour(s))  MRSA PCR SCREENING     Status: None   Collection Time    08/31/2013 11:24 AM      Result Value Ref Range Status   MRSA by PCR NEGATIVE  NEGATIVE Final   Comment:            The GeneXpert MRSA Assay (FDA     approved for NASAL specimens     only), is one component of a     comprehensive MRSA colonization     surveillance program. It is not     intended to diagnose MRSA     infection nor to guide or     monitor treatment for     MRSA infections.  URINE CULTURE     Status: None   Collection Time    08/26/2013 11:26 AM      Result Value Ref Range Status   Specimen  Description URINE, CATHETERIZED   Final   Special Requests Normal   Final   Culture  Setup Time     Final   Value: 08/30/2013 12:02     Performed at [facility]   Colony Count     Final   Value: >=100,000 COLONIES/ML     Performed at [facility]   Culture     Final   Value: Multiple bacterial morphotypes present, none predominant. Suggest appropriate recollection if clinically indicated.     Performed at [facility]   Report Status 08/08/2013 FINAL   Final  CULTURE, BLOOD (ROUTINE X 2)     Status: None   Collection Time    08/16/2013  4:20 PM      Result Value Ref Range Status   Specimen Description BLOOD   Final   Special Requests     Final   Value: BOTTLES DRAWN AEROBIC AND ANAEROBIC 10CC LEFT IJ CVC   Culture  Setup Time     Final   Value: 08/25/2013 20:59     Performed at [facility]   Culture     Final   Value: NO GROWTH 5 DAYS     Performed at [facility]  Report Status 08/13/2013 FINAL   Final    Anti-infectives   Start     Dose/Rate Route Frequency Ordered Stop   08/10/13 0600  imipenem-cilastatin (PRIMAXIN) 250 mg in sodium chloride 0.9 % 100 mL IVPB     250 mg 200 mL/hr over 30 Minutes Intravenous 3 times per day 08/09/13 0021     08/09/13 1000  imipenem-cilastatin (PRIMAXIN) 250 mg in sodium chloride 0.9 % 100 mL IVPB  Status:  Discontinued     250 mg 200 mL/hr over 30 Minutes Intravenous Every 12 hours 08/09/13 0019 08/09/13 0021   08/08/13 1000  micafungin (MYCAMINE) 100 mg in sodium chloride 0.9 % 100 mL IVPB  Status:  Discontinued     100 mg 100 mL/hr over 1 Hours Intravenous Daily 08/08/13 0950 08/08/13 1001   09/02/2013 1800  vancomycin (VANCOCIN) IVPB 1000 mg/200 mL premix  Status:  Discontinued     1,000 mg 200 mL/hr over 60 Minutes Intravenous Every 24 hours 09/03/2013 1625 08/11/13 1057   08/13/2013 1700  imipenem-cilastatin (PRIMAXIN) 250 mg in sodium chloride 0.9 % 100 mL IVPB  Status:  Discontinued     250  mg 200 mL/hr over 30 Minutes Intravenous 3 times per day 08/29/2013 1624 08/09/13 0019      Assessment: 73 year old female who continues on Primaxin (Day #9) for intraabdominal infection. Finished course of Vancomycin for enterococcal UTI. Pt now with hypothermia. WBC 33.7 -trending down over past few days. Her antibiotic dose is appropriate for CRRT.  She is tolerating CRRT at this time.  Goal of Therapy:  Treatment of infection  Plan:  1) Continue Imipenem 250 mg IV q8h. 2) F/u CRRT tolerance and plans for transition off. 3) F/u family discussions   Christoper Fabian, PharmD, BCPS Clinical pharmacist, pager [phone] 08/15/2013, 11:17 AM

## 2013-08-15 NOTE — Progress Notes (Signed)
PULMONARY / CRITICAL CARE MEDICINE   Name: VITALIA STOUGH MRN: [id number] DOB: 1941/03/08    ADMISSION DATE:  08/12/2013  REFERRING MD :  [facility]  PRIMARY SERVICE: PCCM  CHIEF COMPLAINT:  Respiratory Failure   BRIEF PATIENT DESCRIPTION: 73 y/o admitted to [facility] for planned repair of enterocolonic fistula.  Course was complicated by ileus, renal failure, cardiopulmonary arrest.  Transferred to [facility] on 3/4 for further care.   LINES / TUBES: R IJ TLC 2/14 >> 3/04 ETT([place])  2/23 >> 08/14/13, 3/11 (trach by JY) >> R IJ HD cath 3/04 >>  L IJ TLC 3/04 >>    CULTURES: ?date [facility]) >>> ENTEROCOCCUS FAECALIS 3/4 MRSA PCR >>> neg 3/4 Urine >>>  Multiple bacterial morphotypes  3/5 Blood >>  neg     ANTIBIOTICS: Vancomycin 3/4 >> 3/08 Imipenem 3/4 >>   SIGNIFICANT EVENTS / STUDIES:  2/13  OR >>> Ex-lap with small bowel resection, extensive lysis of adhesions, vomiting 2/22  Cardiac arrest - VDRF/Intubated 08/14/2013 - ADMIT [facility] 3/05  TTE: EF 35-40, grade 2 diastolic dysfunction  3/05  CVVHD started 3/8 - No new issues. Tolerates PS 10 cm H2O 08/12/13: 40# off since [facility] with [facility]. Still anuric. Patient is DNAR but full medical care. On levophed .. Family at bedside 08/13/13: No sedation but on levophed . RN says occ opens eyes but unresponsive to this MD (last fentanyl was 1aM)  08/14/13: For Trach today. WC upa at 40; worsening LLQ enteric fistula iwtih collection on CT; high risk for re-exploration per CCS. Still on levophed at . On CRRT. Is overbreathing vent; opens eyes    SUBJECTIVE/OVERNIGHT/INTERVAL HX   08/15/13: s/p trach now. Worsening shock; levophed and neo. LEss responsive. CCS thinks only option for fistula is drainage through ostomy bag.   VITAL SIGNS: Temp:  [94 F (34.4 C)-97.5 F (36.4 C)] 94.1 F (34.5 C) (03/12 0800) Pulse Rate:  [63-105] 95 (03/12 1100) Resp:  [21-42] 36 (03/12  1100) BP: (84-129)/(29-65) 122/58 mmHg (03/12 1100) SpO2:  [90 %-100 %] 98 % (03/12 1100) Arterial Line BP: (79-139)/(29-59) 111/41 mmHg (03/12 1100) FiO2 (%):  [30 %-100 %] 30 % (03/12 1100) Weight:  [63 kg (138 lb 14.2 oz)] 63 kg (138 lb 14.2 oz) (03/12 0400)  HEMODYNAMICS: CVP:  [4 mmHg-5 mmHg] 5 mmHg VENTILATOR SETTINGS: Vent Mode:  [-] PRVC FiO2 (%):  [30 %-100 %] 30 % Set Rate:  [16 bmp] 16 bmp Vt Set:  [380 mL] 380 mL PEEP:  [5 cmH20] 5 cmH20 Plateau Pressure:  [12 cmH20-18 cmH20] 18 cmH20  INTAKE / OUTPUT: Intake/Output     03/11 0701 - 03/12 0700 03/12 0701 - 03/13 0700   I.V. (mL/kg) 1072.1 (17) 236.3 (3.8)   NG/GT     IV Piggyback 350    TPN 2119.8 372   Total Intake(mL/kg) 3541.9 (56.2) 608.3 (9.7)   Urine (mL/kg/hr) 5 (0)    Emesis/NG output 250 (0.2)    Drains 95 (0.1)    Other 3394 (2.2) 220 (0.8)   Total Output 3744 220   Net -202.1 +388.3         PHYSICAL EXAMINATION: General:  Very deconditioned. Critically ill looking Neuro: Diffusely weak, no focal deficits, RASS -4 on no sedation; worse HEENT: WNL Cardiovascular:  RRR s M Lungs: clear anteriorly Abdomen:  BS absent, LLQ colostomy, RLQ fistula draining serous fluid Ext:  2+ symmetric UE and LE edema   PULMONARY  Recent Labs Lab 08/11/13 1827 08/12/13 0325  PHART 7.233* 7.392  PCO2ART 53.9* 36.8  PO2ART 96.8 73.9*  HCO3 22.1 22.0  TCO2 23.8 23.2  O2SAT 97.0 95.6    CBC  Recent Labs Lab 08/13/13 0507 08/14/13 0430 08/15/13 0440  HGB 7.8* 7.8* 7.4*  HCT 23.9* 24.0* 22.8*  WBC 37.5* 38.9* 33.7*  PLT 119* 108* 81*    COAGULATION  Recent Labs Lab 08/14/13 1210  INR 1.16    CARDIAC  No results found for this basename: TROPONINI,  in the last 168 hours No results found for this basename: PROBNP,  in the last 168 hours   CHEMISTRY  Recent Labs Lab 08/11/13 0512  08/12/13 0505  08/13/13 0507 08/13/13 1643 08/14/13 0430 08/14/13 1519 08/15/13 0440  NA 131*  < >  129*  < > 129* 127* 130* 131* 132*  K 3.8  < > 3.4*  < > 4.6 4.5 4.6 4.5 4.4  CL 96  < > 95*  < > 95* 93* 97 97 97  CO2 22  < > 21  < > 21 21 21 23 21   GLUCOSE 203*  < > 227*  < > 200* 215* 177* 162* 192*  BUN 40*  < > 38*  < > 38* 35* 36* 36* 36*  CREATININE 0.84  < > 0.58  < > 0.48* 0.46* 0.41* 0.40* 0.37*  CALCIUM 7.7*  < > 7.7*  < > 7.8* 7.9* 8.2* 8.1* 7.8*  MG 2.4  --  2.1  --  2.2  --  2.3  --  2.3  PHOS  --   < > 1.5*  < > 2.1* 2.9 2.5 2.7 2.4  < > = values in this interval not displayed. Estimated Creatinine Clearance: 54.9 ml/min (by C-G formula based on Cr of 0.37).   LIVER  Recent Labs Lab 08/11/13 0512  08/12/13 0505  08/13/13 0507 08/13/13 1643 08/14/13 0430 08/14/13 1210 08/14/13 1519 08/15/13 0440  AST 899*  --  714*  --   --   --   --   --   --   --   ALT 86*  --  120*  --   --   --   --   --   --   --   ALKPHOS 120*  --  119*  --   --   --   --   --   --   --   BILITOT 1.9*  --  1.9*  --   --   --   --   --   --   --   PROT 5.0*  --  4.8*  --   --   --   --   --   --   --   ALBUMIN 1.8*  < > 1.7*  < > 1.6* 1.6* 1.5*  --  1.6* 1.5*  INR  --   --   --   --   --   --   --  1.16  --   --   < > = values in this interval not displayed.   INFECTIOUS  Recent Labs Lab 08/14/13 1207 08/15/13 0440  PROCALCITON 4.38 4.91     ENDOCRINE CBG (last 3)   Recent Labs  08/14/13 2350 08/15/13 0416 08/15/13 0818  GLUCAP 157* 158* 151*         IMAGING x48h  Ct Abdomen Pelvis Wo Contrast  08/13/2013   CLINICAL DATA Patient hospitalized for planned  repair of enterocolonic fistula. Hospital course complicated by ileus, renal failure and cardiopulmonary arrest. Increasing white blood cell count.  EXAM CT ABDOMEN AND PELVIS WITHOUT CONTRAST  TECHNIQUE Multidetector CT imaging of the abdomen and pelvis was performed following the standard protocol without intravenous contrast.  COMPARISON 08/06/2013  FINDINGS There are thick-walled irregular loops of small bowel  left lower quadrant below the level of the left lower quadrant colostomy. In this location, there is an irregular defect extending from a small bowel loop that connects with an extraluminal collection of contrast in the left lower quadrant. Contrast trauma wrist collection then extends anteriorly and medially along a thin tract breaching the left anterior abdominal wall fascia, and extending to the midline, where collects along the midline incision. This fistula appears larger than it did previously, and the amount of contrast leaking into the fistula is increased.  The lower sigmoid colon is terminated in a Hartman's pouch. It abuts the posterior inferior margin of the extraluminal contrast collection and the abnormal left lower quadrant small bowel loops.  The left colon has been diverted into the left lower quadrant colostomy. The colon is mostly decompressed. There is no evidence of a bowel obstruction. There is no free air.  Right pleural effusion has mildly decreased in size from prior study. Left pleural effusion stable in lung base consolidation in coarse reticular opacity, which is most likely atelectasis, has mildly improved in the right lower lobe and right middle lobe in and relatively stable on the left.  Liver, spleen, gallbladder and pancreas are unremarkable. No bile duct dilation. No adrenal masses. There are bilateral renal masses with most likely cysts. No hydronephrosis. Normal ureters. Bladder is decompressed with Foley catheter. Dense atherosclerotic plaque is noted throughout a normal caliber abdominal aorta and its branch vessels.  No ascites.  There is diffuse subcutaneous soft tissue edema.  Degenerative changes are noted throughout the visualized spine. The bones are demineralized.  IMPRESSION 1. There is an enteric fistula leaking contrast into the left lower quadrant which then extends along the left anterior peritoneal cavity breeching the anterior abdominal wall fascia, collecting  within the lower anterior abdominal wall incision. This has increased in size since the prior study in the degree of contrast leakage has increased. 2. Right pleural effusion has decreased in size from prior study and there is less right lung base atelectasis. There has also than resolution of the small amount of ascites noted previously. 3. Multiple other chronic findings as detailed above which are stable. No other significant change from the prior exam.  SIGNATURE  Electronically Signed   By: Amie Portland M.D.   On: 08/13/2013 15:54   Dg Chest Port 1 View  08/15/2013   CLINICAL DATA Shortness of breath.  Intubation.  EXAM PORTABLE CHEST - 1 VIEW  COMPARISON DG CHEST 1V PORT dated 08/14/2013  FINDINGS Tracheostomy tube, NG tube, right IJ line, left IJ line in stable position. Prior CABG. Cardiac size and pulmonary vascularity stable. Bilateral pulmonary alveolar infiltrates are again noted. Atelectatic changes both lung bases.  IMPRESSION 1. Stable line and tube positions . 2. Persistent unchanged bilateral pulmonary alveolar infiltrates with basilar atelectasis. 3. Prior CABG.  Cardiac size normal.  SIGNATURE  Electronically Signed   By: Maisie Fus  Register   On: 08/15/2013 07:28   Chest Portable 1 View To Assess Tube Placement And Rule-out Pneumothorax  08/14/2013   CLINICAL DATA Tracheostomy placement  EXAM PORTABLE CHEST - 1 VIEW  COMPARISON 08/13/2013  FINDINGS Tracheostomy  has been placed in good position.  No pneumothorax  Bilateral jugular central venous catheter tips in the SVC, unchanged.  Mild improvement in bibasilar atelectasis/infiltrate.  IMPRESSION Satisfactory tracheostomy placement without pneumothorax.  Mild improvement in bibasilar airspace disease.  SIGNATURE  Electronically Signed   By: Marlan Palau M.D.   On: 08/14/2013 15:10      ASSESSMENT / PLAN:  PULMONARY A: Acute respiratory failure COPD without exacerbation LLL PNA on CT abd 08/13/13   - Failure to wean due to severe  physical deconditioning, septick shock and poor mental status P:   Tracheostomy today (consented, risks explained)   CARDIOVASCULAR A:  Septic shock Cardiogenic shock S/p Cardiopulmonary arrest Acute on chronic systolic and diastolic CHF NSTEMI 2/23 [facility])   - still on pressors  P:  Wean NE to off for MAP > 65 mmHg  RENAL A:   AKI, anuric Hyperkalemia, resolved Anasasrca   - still anuric. 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 Ileus Protein calorie malnutrition   - CT shows worsening LLQ fistula; can explain rising WBC  P:   Await CT SUP: IV PPI Cont TPN Surgery following  HEMATOLOGIC A:   Anemia Mild thrombocytopenia P:  DVT px: SCDs PRBC for hgb < 7gm%  INFECTIOUS A:   Severe sepsis with septic shock UTI P:   Cx / abx as above   ENDOCRINE A:   Hyperglycemia P:   Cont SSI   NEUROLOGIC A:   Acute encephalopathy   - still only minimal responsive only but per RN occ follows commands P:   Goal RASS 0 to -1 Cont PRN fentanyl  GLOBAL  08/12/13 husband not at bedside but learned during multi disciplinary rounds that despite several health care providers educating him about trach, ltach he is unable to comprehend what this all means. However, it appears emotionally he wants her to continue to get care and is not interested in pure palliation and he is hopeful for a recovery. Therefore, willl offer trach  08/14/13: detailed goals of care with husband his sister: he is emotionally torn. He understands very poor prognosis but does not want to give up yet. Agreed to tracheostomy with time limited trial of few to several week; if no recovery then palliate  I have personally obtained history, examined patient, evaluated and interpreted laboratory and imaging results, reviewed medical records, formulated assessment / plan and placed orders.  CRITICAL CARE:  The patient  is critically ill with multiple organ systems failure and requires high complexity decision making for assessment and support, frequent evaluation and titration of therapies, application of advanced monitoring technologies and extensive interpretation of multiple databases. Critical Care Time devoted to patient care services described in this note is 40 minutes.   Dr. Kalman Shan, M.D., [facility].C.P Pulmonary and Critical Care Medicine Staff Physician [facility] [place] [facility] Pager: [phone], If no answer or between  15:00h - 7:00h: call [phone]  08/15/2013 11:13 AM

## 2013-08-15 NOTE — Progress Notes (Signed)
CVVHD management.  Metabolically stable.  Still requiring levophed.  Keeping even since yest. CVP 5, will let her be + on fluids for next 2 hrs and then keep even.

## 2013-08-15 NOTE — Progress Notes (Signed)
Subjective: Remains on pressors Tolerated bedside trach  Objective: Vital signs in last 24 hours: Temp:  [94 F (34.4 C)-97.5 F (36.4 C)] 94.1 F (34.5 C) (03/12 0800) Pulse Rate:  [63-105] 85 (03/12 0800) Resp:  [21-42] 37 (03/12 0800) BP: (84-129)/(29-65) 106/39 mmHg (03/12 0800) SpO2:  [90 %-100 %] 93 % (03/12 0800) Arterial Line BP: (79-139)/(29-59) 106/43 mmHg (03/12 0800) FiO2 (%):  [30 %-100 %] 30 % (03/12 0800) Weight:  [138 lb 14.2 oz (63 kg)] 138 lb 14.2 oz (63 kg) (03/12 0400) Last BM Date: 08/14/13  Intake/Output from previous day: 03/11 0701 - 03/12 0700 In: 3541.9 [I.V.:1072.1; IV Piggyback:350; TPN:2119.8] Out: 3744 [Urine:5; Emesis/NG output:250; Drains:95] Intake/Output this shift: Total I/O In: 145.2 [I.V.:52.2; TPN:93] Out: 94 [Other:94]  Abdomen with open wound and bilious drainage from fistula  Lab Results:   Recent Labs  08/14/13 0430 08/15/13 0440  WBC 38.9* 33.7*  HGB 7.8* 7.4*  HCT 24.0* 22.8*  PLT 108* 81*   BMET  Recent Labs  08/14/13 1519 08/15/13 0440  NA 131* 132*  K 4.5 4.4  CL 97 97  CO2 23 21  GLUCOSE 162* 192*  BUN 36* 36*  CREATININE 0.40* 0.37*  CALCIUM 8.1* 7.8*   PT/INR  Recent Labs  08/14/13 1210  LABPROT 14.6  INR 1.16   ABG No results found for this basename: PHART, PCO2, PO2, HCO3,  in the last 72 hours  Studies/Results: Ct Abdomen Pelvis Wo Contrast  08/13/2013   CLINICAL DATA Patient hospitalized for planned repair of enterocolonic fistula. Hospital course complicated by ileus, renal failure and cardiopulmonary arrest. Increasing white blood cell count.  EXAM CT ABDOMEN AND PELVIS WITHOUT CONTRAST  TECHNIQUE Multidetector CT imaging of the abdomen and pelvis was performed following the standard protocol without intravenous contrast.  COMPARISON 08/06/2013  FINDINGS There are thick-walled irregular loops of small bowel left lower quadrant below the level of the left lower quadrant colostomy. In this  location, there is an irregular defect extending from a small bowel loop that connects with an extraluminal collection of contrast in the left lower quadrant. Contrast trauma wrist collection then extends anteriorly and medially along a thin tract breaching the left anterior abdominal wall fascia, and extending to the midline, where collects along the midline incision. This fistula appears larger than it did previously, and the amount of contrast leaking into the fistula is increased.  The lower sigmoid colon is terminated in a Hartman's pouch. It abuts the posterior inferior margin of the extraluminal contrast collection and the abnormal left lower quadrant small bowel loops.  The left colon has been diverted into the left lower quadrant colostomy. The colon is mostly decompressed. There is no evidence of a bowel obstruction. There is no free air.  Right pleural effusion has mildly decreased in size from prior study. Left pleural effusion stable in lung base consolidation in coarse reticular opacity, which is most likely atelectasis, has mildly improved in the right lower lobe and right middle lobe in and relatively stable on the left.  Liver, spleen, gallbladder and pancreas are unremarkable. No bile duct dilation. No adrenal masses. There are bilateral renal masses with most likely cysts. No hydronephrosis. Normal ureters. Bladder is decompressed with Foley catheter. Dense atherosclerotic plaque is noted throughout a normal caliber abdominal aorta and its branch vessels.  No ascites.  There is diffuse subcutaneous soft tissue edema.  Degenerative changes are noted throughout the visualized spine. The bones are demineralized.  IMPRESSION 1. There is an  enteric fistula leaking contrast into the left lower quadrant which then extends along the left anterior peritoneal cavity breeching the anterior abdominal wall fascia, collecting within the lower anterior abdominal wall incision. This has increased in size since  the prior study in the degree of contrast leakage has increased. 2. Right pleural effusion has decreased in size from prior study and there is less right lung base atelectasis. There has also than resolution of the small amount of ascites noted previously. 3. Multiple other chronic findings as detailed above which are stable. No other significant change from the prior exam.  SIGNATURE  Electronically Signed   By: Amie Portland M.D.   On: 08/13/2013 15:54   Dg Chest Port 1 View  08/15/2013   CLINICAL DATA Shortness of breath.  Intubation.  EXAM PORTABLE CHEST - 1 VIEW  COMPARISON DG CHEST 1V PORT dated 08/14/2013  FINDINGS Tracheostomy tube, NG tube, right IJ line, left IJ line in stable position. Prior CABG. Cardiac size and pulmonary vascularity stable. Bilateral pulmonary alveolar infiltrates are again noted. Atelectatic changes both lung bases.  IMPRESSION 1. Stable line and tube positions . 2. Persistent unchanged bilateral pulmonary alveolar infiltrates with basilar atelectasis. 3. Prior CABG.  Cardiac size normal.  SIGNATURE  Electronically Signed   By: Maisie Fus  Register   On: 08/15/2013 07:28   Chest Portable 1 View To Assess Tube Placement And Rule-out Pneumothorax  08/14/2013   CLINICAL DATA Tracheostomy placement  EXAM PORTABLE CHEST - 1 VIEW  COMPARISON 08/13/2013  FINDINGS Tracheostomy has been placed in good position.  No pneumothorax  Bilateral jugular central venous catheter tips in the SVC, unchanged.  Mild improvement in bibasilar atelectasis/infiltrate.  IMPRESSION Satisfactory tracheostomy placement without pneumothorax.  Mild improvement in bibasilar airspace disease.  SIGNATURE  Electronically Signed   By: Marlan Palau M.D.   On: 08/14/2013 15:10    Anti-infectives: Anti-infectives   Start     Dose/Rate Route Frequency Ordered Stop   08/10/13 0600  imipenem-cilastatin (PRIMAXIN) 250 mg in sodium chloride 0.9 % 100 mL IVPB     250 mg 200 mL/hr over 30 Minutes Intravenous 3 times per  day 08/09/13 0021     08/09/13 1000  imipenem-cilastatin (PRIMAXIN) 250 mg in sodium chloride 0.9 % 100 mL IVPB  Status:  Discontinued     250 mg 200 mL/hr over 30 Minutes Intravenous Every 12 hours 08/09/13 0019 08/09/13 0021   08/08/13 1000  micafungin (MYCAMINE) 100 mg in sodium chloride 0.9 % 100 mL IVPB  Status:  Discontinued     100 mg 100 mL/hr over 1 Hours Intravenous Daily 08/08/13 0950 08/08/13 1001   08/19/2013 1800  vancomycin (VANCOCIN) IVPB 1000 mg/200 mL premix  Status:  Discontinued     1,000 mg 200 mL/hr over 60 Minutes Intravenous Every 24 hours 08/26/2013 1625 08/11/13 1057   08/19/2013 1700  imipenem-cilastatin (PRIMAXIN) 250 mg in sodium chloride 0.9 % 100 mL IVPB  Status:  Discontinued     250 mg 200 mL/hr over 30 Minutes Intravenous 3 times per day 08/11/2013 1624 08/09/13 0019      Assessment/Plan: s/p * No surgery found *  Enterocutaneous fistula.  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  I don't think she would tolerate any attempt to resect area and perform an ostomy.  LOS: 8 days    Jalesha Plotz A 08/15/2013

## 2013-08-15 NOTE — Progress Notes (Signed)
Pt on 50 mcg of Levophed and 200 mcg of Neo-synephrine with MAP less than 65. Dr. Kendrick FriesMcQuaid notified. No new orders. Will continue to monitor.

## 2013-08-15 NOTE — Clinical Social Work Psychosocial (Signed)
Clinical Social Work Department BRIEF PSYCHOSOCIAL ASSESSMENT 08/15/2013  Patient:  Leah [name],Leah [name]     Account Number:  [id number]     Admit date:  08/15/2013  Clinical Social Worker:  Read DriversINGLE,Haylin Camilli, LCSWA  Date/Time:  08/15/2013 11:44 AM  Referred by:  Care Management  Date Referred:  08/15/2013 Referred for  SNF Placement   Other Referral:   none   Interview type:  Other - See comment Other interview type:   pt husband, Leah [name]    PSYCHOSOCIAL DATA Living Status:  HUSBAND Admitted from facility:   Level of care:   Primary support name:  Leah [name] Primary support relationship to patient:  SPOUSE Degree of support available:   adequate    CURRENT CONCERNS Current Concerns  Post-Acute Placement   Other Concerns:   pt husband's understanding of medical care needed upon dc    SOCIAL WORK ASSESSMENT / PLAN Pt has trach and is currently not following commands. CSW reviewed possible disposition with pt husband, [place]. Marlinda [place] states that he and the pt [place].  Leah [name] reports he would like to for wife to come home, but is agreeable for placement if MD deems necessary.  Pt husband reports that he is hopeful that his wife will gain strength and her health will improve enough for her to come home.  Marlinda MikeDallas is very cooperative and pleasant throughout the assessment, though I am not quite certain that [place] understands his wife's prognosis.  RNCM is involved and is agreeable to assist with helping educate husband.   Assessment/plan status:  Psychosocial Support/Ongoing Assessment of Needs Other assessment/ plan:   Information/referral to community resources:   SNF  RNCM    PATIENT'S/FAMILY'S RESPONSE TO PLAN OF CARE: Marlinda Mikeallas, pt husband is agreeable to SNF placement upon the recommendation of the MD.        Vickii PennaGina Tessie Ordaz, LCSWA [phone]  Clinical Social Work

## 2013-08-16 ENCOUNTER — Inpatient Hospital Stay (HOSPITAL_COMMUNITY): Payer: Medicare Other

## 2013-08-16 DIAGNOSIS — Z66 Do not resuscitate: Secondary | ICD-10-CM

## 2013-08-16 LAB — CARBOXYHEMOGLOBIN
Carboxyhemoglobin: 2.4 % — ABNORMAL HIGH (ref 0.5–1.5)
METHEMOGLOBIN: 0.7 % (ref 0.0–1.5)
O2 SAT: 61.9 %
TOTAL HEMOGLOBIN: 8.6 g/dL — AB (ref 12.0–16.0)

## 2013-08-16 LAB — GLUCOSE, CAPILLARY
GLUCOSE-CAPILLARY: 177 mg/dL — AB (ref 70–99)
Glucose-Capillary: 173 mg/dL — ABNORMAL HIGH (ref 70–99)
Glucose-Capillary: 209 mg/dL — ABNORMAL HIGH (ref 70–99)
Glucose-Capillary: 163 mg/dL — ABNORMAL HIGH (ref 70–99)
Glucose-Capillary: 157 mg/dL — ABNORMAL HIGH (ref 70–99)
Glucose-Capillary: 163 mg/dL — ABNORMAL HIGH (ref 70–99)

## 2013-08-16 LAB — RENAL FUNCTION PANEL
ALBUMIN: 1.3 g/dL — AB (ref 3.5–5.2)
BUN: 33 mg/dL — AB (ref 6–23)
CHLORIDE: 95 meq/L — AB (ref 96–112)
CO2: 22 mEq/L (ref 19–32)
Calcium: 8 mg/dL — ABNORMAL LOW (ref 8.4–10.5)
Creatinine, Ser: 0.41 mg/dL — ABNORMAL LOW (ref 0.50–1.10)
GFR calc non Af Amer: >90 mL/min (ref >90)
GLUCOSE: 159 mg/dL — AB (ref 70–99)
POTASSIUM: 4.6 meq/L (ref 3.7–5.3)
Phosphorus: 3.1 mg/dL (ref 2.3–4.6)
SODIUM: 129 meq/L — AB (ref 137–147)

## 2013-08-16 LAB — CBC WITH DIFFERENTIAL/PLATELET
Basophils Absolute: 0.3 10*3/uL — ABNORMAL HIGH (ref 0.0–0.1)
Basophils Relative: 1 % (ref 0–1)
EOS PCT: 1 % (ref 0–5)
Eosinophils Absolute: 0.3 10*3/uL (ref 0.0–0.7)
HCT: 23 % — ABNORMAL LOW (ref 36.0–46.0)
Hemoglobin: 7.1 g/dL — ABNORMAL LOW (ref 12.0–15.0)
LYMPHS PCT: 6 % — AB (ref 12–46)
Lymphs Abs: 1.7 10*3/uL (ref 0.7–4.0)
MCH: 30.1 pg (ref 26.0–34.0)
MCHC: 30.9 g/dL (ref 30.0–36.0)
MCV: 97.5 fL (ref 78.0–100.0)
MONOS PCT: 7 % (ref 3–12)
Monocytes Absolute: 1.9 10*3/uL — ABNORMAL HIGH (ref 0.1–1.0)
NEUTROS PCT: 85 % — AB (ref 43–77)
Neutro Abs: 23.6 10*3/uL — ABNORMAL HIGH (ref 1.7–7.7)
PLATELETS: 66 10*3/uL — AB (ref 150–400)
RBC: 2.36 MIL/uL — AB (ref 3.87–5.11)
RDW: 24.4 % — ABNORMAL HIGH (ref 11.5–15.5)
WBC MORPHOLOGY: INCREASED
WBC: 27.8 10*3/uL — AB (ref 4.0–10.5)

## 2013-08-16 LAB — COMPREHENSIVE METABOLIC PANEL
ALBUMIN: 1.4 g/dL — AB (ref 3.5–5.2)
ALK PHOS: 199 U/L — AB (ref 39–117)
ALT: 75 U/L — AB (ref 0–35)
AST: 164 U/L — ABNORMAL HIGH (ref 0–37)
BUN: 30 mg/dL — ABNORMAL HIGH (ref 6–23)
CALCIUM: 8 mg/dL — AB (ref 8.4–10.5)
CO2: 22 mEq/L (ref 19–32)
Chloride: 95 mEq/L — ABNORMAL LOW (ref 96–112)
Creatinine, Ser: 0.38 mg/dL — ABNORMAL LOW (ref 0.50–1.10)
GFR calc Af Amer: >90 mL/min (ref >90)
GFR calc non Af Amer: >90 mL/min (ref >90)
Glucose, Bld: 196 mg/dL — ABNORMAL HIGH (ref 70–99)
POTASSIUM: 4.7 meq/L (ref 3.7–5.3)
SODIUM: 130 meq/L — AB (ref 137–147)
TOTAL PROTEIN: 4.8 g/dL — AB (ref 6.0–8.3)
Total Bilirubin: 4.8 mg/dL — ABNORMAL HIGH (ref 0.3–1.2)

## 2013-08-16 LAB — TROPONIN I
TROPONIN I: 0.55 ng/mL — AB (ref <0.30)
TROPONIN I: 0.62 ng/mL — AB (ref <0.30)
Troponin I: 0.54 ng/mL (ref <0.30)

## 2013-08-16 LAB — PHOSPHORUS: PHOSPHORUS: 2.7 mg/dL (ref 2.3–4.6)

## 2013-08-16 LAB — PROCALCITONIN: Procalcitonin: 7.95 ng/mL

## 2013-08-16 LAB — MAGNESIUM: MAGNESIUM: 2.4 mg/dL (ref 1.5–2.5)

## 2013-08-16 MED ORDER — SODIUM CHLORIDE 0.9 % IV BOLUS (SEPSIS)
500.0000 mL | Freq: Once | INTRAVENOUS | Status: AC
  Administered 2013-08-16: 500 mL via INTRAVENOUS

## 2013-08-16 MED ORDER — M.V.I. ADULT IV INJ
INTRAVENOUS | Status: DC
  Filled 2013-08-16: qty 2000

## 2013-08-16 MED ORDER — TRACE MINERALS CR-CU-F-FE-I-MN-MO-SE-ZN IV SOLN
INTRAVENOUS | Status: AC
  Administered 2013-08-16: 18:00:00 via INTRAVENOUS
  Filled 2013-08-16: qty 2000

## 2013-08-16 MED ORDER — FAT EMULSION 20 % IV EMUL
250.0000 mL | INTRAVENOUS | Status: AC
  Administered 2013-08-16: 250 mL via INTRAVENOUS
  Filled 2013-08-16: qty 250

## 2013-08-16 NOTE — Progress Notes (Signed)
CVVHD management.  Remains metabolically stable.  Hypotension cont to be an issue, now on 3 pressors.  Received IV saline yest.  CVP now 5.  Will give 500cc bolus

## 2013-08-16 NOTE — Progress Notes (Signed)
I have seen and examined the patient and agree with the assessment and plans. Fistula appears to be draining adequately.  It would be extremely difficult to try to explore her abdomen.  Seva Chancy A. Magnus IvanBlackman  MD, FACS

## 2013-08-16 NOTE — Progress Notes (Signed)
Patient ID: Leah [name], female   DOB: 1940-08-22, 73 y.o.   MRN: [id number]    Subjective: Pt trached.  Abdomen still putting out bilious drainage  Objective: Vital signs in last 24 hours: Temp:  [97.4 F (36.3 C)-98.7 F (37.1 C)] 97.9 F (36.6 C) (03/13 0841) Pulse Rate:  [69-123] 86 (03/13 1000) Resp:  [20-55] 32 (03/13 1000) BP: (85-122)/(31-66) 110/54 mmHg (03/13 1000) SpO2:  [93 %-100 %] 100 % (03/13 1000) Arterial Line BP: (73-134)/(29-53) 124/47 mmHg (03/13 1000) FiO2 (%):  [30 %] 30 % (03/13 1000) Weight:  [142 lb 3.2 oz (64.5 kg)] 142 lb 3.2 oz (64.5 kg) (03/13 0400) Last BM Date: 08/14/13  Intake/Output from previous day: 03/12 0701 - 03/13 0700 In: 5946.1 [I.V.:2494.1; NG/GT:60; IV Piggyback:1300; TPN:2092] Out: 4635 [Emesis/NG output:150; Drains:85] Intake/Output this shift: Total I/O In: 1205.3 [I.V.:256.3; IV Piggyback:700; TPN:249] Out: 634 [Other:634]  PE: Abd: soft, drain still with copious amounts of enteric drainage.  Ostomy with a necrotic bud on it.  Thought it was a piece of stool, but I am unable to remove it from the stoma itself.  Lab Results:   Recent Labs  08/15/13 0440 08/16/13 0442  WBC 33.7* 27.8*  HGB 7.4* 7.1*  HCT 22.8* 23.0*  PLT 81* 66*   BMET  Recent Labs  08/15/13 1600 08/16/13 0442  NA 130* 130*  K 4.3 4.7  CL 97 95*  CO2 21 22  GLUCOSE 199* 196*  BUN 31* 30*  CREATININE 0.36* 0.38*  CALCIUM 7.7* 8.0*   PT/INR  Recent Labs  08/14/13 1210  LABPROT 14.6  INR 1.16   CMP     Component Value Date/Time   NA 130* 08/16/2013 0442   K 4.7 08/16/2013 0442   CL 95* 08/16/2013 0442   CO2 22 08/16/2013 0442   GLUCOSE 196* 08/16/2013 0442   BUN 30* 08/16/2013 0442   CREATININE 0.38* 08/16/2013 0442   CALCIUM 8.0* 08/16/2013 0442   PROT 4.8* 08/16/2013 0442   ALBUMIN 1.4* 08/16/2013 0442   AST 164* 08/16/2013 0442   ALT 75* 08/16/2013 0442   ALKPHOS 199* 08/16/2013 0442   BILITOT 4.8* 08/16/2013 0442   GFRNONAA >90  08/16/2013 0442   GFRAA >90 08/16/2013 0442   Lipase  No results found for this basename: lipase       Studies/Results: Dg Chest Port 1 View  08/16/2013   CLINICAL DATA:  Intubation.  EXAM: PORTABLE CHEST - 1 VIEW  COMPARISON:  DG CHEST 1V PORT dated 08/15/2013; DG CHEST 1V PORT dated 08/13/2013  FINDINGS: Tracheostomy tube, NG tube, right and left IJ lines in stable position. Heart size stable. Prior CABG. Persistent unchanged bilateral prominent pulmonary infiltrates are present. Atelectatic changes again noted in both lung bases. Small left pleural effusion cannot be excluded. No pneumothorax.  IMPRESSION: 1. Persistent bilateral dense pulmonary infiltrates with basilar atelectasis. 2. Prior CABG. Heart size and pulmonary vascularity stable. No pulmonary venous congestion. 3. Line and tube positions stable.   Electronically Signed   By: Maisie Fus  Register   On: 08/16/2013 08:09   Dg Chest Port 1 View  08/15/2013   CLINICAL DATA Shortness of breath.  Intubation.  EXAM PORTABLE CHEST - 1 VIEW  COMPARISON DG CHEST 1V PORT dated 08/14/2013  FINDINGS Tracheostomy tube, NG tube, right IJ line, left IJ line in stable position. Prior CABG. Cardiac size and pulmonary vascularity stable. Bilateral pulmonary alveolar infiltrates are again noted. Atelectatic changes both lung bases.  IMPRESSION 1. Stable line  and tube positions . 2. Persistent unchanged bilateral pulmonary alveolar infiltrates with basilar atelectasis. 3. Prior CABG.  Cardiac size normal.  SIGNATURE  Electronically Signed   By: Maisie Fushomas  Register   On: 08/15/2013 07:28   Chest Portable 1 View To Assess Tube Placement And Rule-out Pneumothorax  08/14/2013   CLINICAL DATA Tracheostomy placement  EXAM PORTABLE CHEST - 1 VIEW  COMPARISON 08/13/2013  FINDINGS Tracheostomy has been placed in good position.  No pneumothorax  Bilateral jugular central venous catheter tips in the SVC, unchanged.  Mild improvement in bibasilar atelectasis/infiltrate.   IMPRESSION Satisfactory tracheostomy placement without pneumothorax.  Mild improvement in bibasilar airspace disease.  SIGNATURE  Electronically Signed   By: Marlan Palauharles  Clark M.D.   On: 08/14/2013 15:10   Ct Portable Head W/o Cm  08/15/2013   CLINICAL DATA:  Decreased responsiveness.  Altered mental status.  EXAM: CT HEAD WITHOUT CONTRAST  TECHNIQUE: Contiguous axial images were obtained from the base of the skull through the vertex without contrast.  COMPARISON:  None  FINDINGS: Study was performed with the CT portable apparatus. Patient is intubated.  No evidence for acute infarction, hemorrhage, mass lesion, hydrocephalus, or extra-axial fluid. Moderate cerebral and cerebellar atrophy. Chronic microvascular ischemic change is suspected.  Small focus of hypoattenuation left thalamus, likely chronic lacune. Asymmetric hypoattenuation left inferior cerebellum is likely artifactual, although an indeterminate age cerebellar infarct could have this appearance. No other areas suspicious for posterior fossa ischemia.  Vascular calcification. Air-fluid level in both divisions of the sphenoid sinus could be secondary to prolonged supine positioning or reflect sinus disease. Bilateral mastoid effusions are suspected. No focal osseous lesions. Vascular calcification.  IMPRESSION: Chronic changes as described. No acute intracranial abnormality is detected.   Electronically Signed   By: Davonna BellingJohn  Curnes M.D.   On: 08/15/2013 21:15    Anti-infectives: Anti-infectives   Start     Dose/Rate Route Frequency Ordered Stop   08/15/13 1200  micafungin (MYCAMINE) 100 mg in sodium chloride 0.9 % 100 mL IVPB     100 mg 100 mL/hr over 1 Hours Intravenous Daily 08/15/13 1134     08/10/13 0600  imipenem-cilastatin (PRIMAXIN) 250 mg in sodium chloride 0.9 % 100 mL IVPB     250 mg 200 mL/hr over 30 Minutes Intravenous 3 times per day 08/09/13 0021     08/09/13 1000  imipenem-cilastatin (PRIMAXIN) 250 mg in sodium chloride 0.9 % 100  mL IVPB  Status:  Discontinued     250 mg 200 mL/hr over 30 Minutes Intravenous Every 12 hours 08/09/13 0019 08/09/13 0021   08/08/13 1000  micafungin (MYCAMINE) 100 mg in sodium chloride 0.9 % 100 mL IVPB  Status:  Discontinued     100 mg 100 mL/hr over 1 Hours Intravenous Daily 08/08/13 0950 08/08/13 1001   08/31/2013 1800  vancomycin (VANCOCIN) IVPB 1000 mg/200 mL premix  Status:  Discontinued     1,000 mg 200 mL/hr over 60 Minutes Intravenous Every 24 hours 08/04/2013 1625 08/11/13 1057   08/08/2013 1700  imipenem-cilastatin (PRIMAXIN) 250 mg in sodium chloride 0.9 % 100 mL IVPB  Status:  Discontinued     250 mg 200 mL/hr over 30 Minutes Intravenous 3 times per day 08/29/2013 1624 08/09/13 0019       Assessment/Plan  1. EC fistula s/p ex lap [facility] SBR and [facility]  Plan: 1. Cont current care.  i spoke to the husband about long term care for this.  He understands  and is not sure she can tolerate all of that.   LOS: 9 days    Kaila Devries E 08/16/2013, 11:07 AM Pager: [phone]

## 2013-08-16 NOTE — Progress Notes (Signed)
PARENTERAL NUTRITION CONSULT NOTE:  FOLLOW-UP  Pharmacy Consult:  TPN Indication: Suspect TF intolerance d/t to severe bowel edema  Allergies  Allergen Reactions  . Cephalexin Other (See Comments)    Unknown  . Codeine Nausea And Vomiting    Patient Measurements: Height: 5\' 1"  (154.9 cm) Weight: 142 lb 3.2 oz (64.5 kg) IBW/kg (Calculated) : 47.8  Usual Weight: 55 kg (this was admit weight at [facility] 2/13)  Vital Signs: Temp: 98.4 F (36.9 C) (03/13 0421) Temp src: Oral (03/13 0421) BP: 111/66 mmHg (03/13 0700) Pulse Rate: 69 (03/13 0700) Intake/Output from previous day: 03/12 0701 - 03/13 0700 In: 6046.1 [I.V.:2494.1; NG/GT:60; IV Piggyback:1400; TPN:2092] Out: 4635 [Emesis/NG output:150; Drains:85]  Labs:  Recent Labs  08/14/13 0430 08/14/13 1210 08/15/13 0440 08/16/13 0442  WBC 38.9*  --  33.7* PENDING  HGB 7.8*  --  7.4* 7.1*  HCT 24.0*  --  22.8* 23.0*  PLT 108*  --  81* PENDING  APTT  --  51*  --   --   INR  --  1.16  --   --      Recent Labs  08/14/13 0430  08/15/13 0440 08/15/13 1600 08/16/13 0442  NA 130*  < > 132* 130* 130*  K 4.6  < > 4.4 4.3 4.7  CL 97  < > 97 97 95*  CO2 21  < > 21 21 22   GLUCOSE 177*  < > 192* 199* 196*  BUN 36*  < > 36* 31* 30*  CREATININE 0.41*  < > 0.37* 0.36* 0.38*  CALCIUM 8.2*  < > 7.8* 7.7* 8.0*  MG 2.3  --  2.3  --  2.4  PHOS 2.5  < > 2.4 2.4 2.7  PROT  --   --   --   --  4.8*  ALBUMIN 1.5*  < > 1.5* 1.3* 1.4*  AST  --   --   --   --  164*  ALT  --   --   --   --  75*  ALKPHOS  --   --   --   --  199*  BILITOT  --   --   --   --  4.8*  < > = values in this interval not displayed. Estimated Creatinine Clearance: 55.5 ml/min (by C-G formula based on Cr of 0.38).    Recent Labs  08/15/13 1855 08/15/13 2345 08/16/13 0417  GLUCAP 155* 177* 173*     Insulin Requirements in the past 24 hours:  17 units moderate SSI + 67 units regular insulin in TPN with CBGs 155-177  Assessment: 60 YOF admitted  07/19/13 to [facility] for planned repair of enterocolonic fistula. Post-op course complicated by ileus, renal failure, and cardiopulmonary arrest.  Patient transferred to [facility] on 08/22/2013.  GI: bowels are too edematous to tolerate TF and may trickle feed when volume status improves per Surgery. Baseline prealbumin was 15.6 now 12.3. TG 138. NG O/P 250>>500>>250>>150, PPI IV. Change in wound output to thick brown. 3/10 CT: Enteric fistula increased in size. Drains 85.Abdomen with open wound and bilious drainage from fistula   Endo: no hx DM - requiring significant amounts of insulin. CBGs much improved now <200.   Lytes: Low Na 130 (stable) and Phos 2.7 today. Ca 8.0 adjusts to 10.08.  Renal: hx CKD started on CRRT on 08/08/13 for volume regulation - SCr/BUN improving, up 60-70lbs since admit with diffuse anasarca (55kg at [place], initial weight at [facility] was 86kg,  now at 64.5kg).  NS at 10 ml/hr. Minimal to no UOP. TPN providing 2 g/kg/day of protein while on CRRT. Keeping even.  Pulm: intubated at [facility] - FiO2 30% (stable). 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  Norepi, levophed and vasopressin for worsening shock  Hepatobil: AST 899>>164, ALT 86>>75, Tbili increased to 4.8, alk phos 199  Neuro: less responsive  ID: Primaxin for intra-abd infxn + Enterococcus UTI, micafungin, afebrile, WBC up to 38.9.  Best Practices: PPI IV, MC, SCDs on patient.  TPN Access: CVC placed 08/26/2013  TPN day#: PTA from [facility] since 2/27   Current Nutrition:  - TPN at 39ml/hr and Lipids 49ml/hr on Mon/Fri (decrease to 2d per week to prevent essential FA def) provides an average of 1551 kCal and 100gm protein daily.  Nutritional Goals:  1393 kCal (goal for permissive underfeeding ~1200 kCal); protein 115 gm per day- RD note 3/11   Plan:  - Continue Clinimix E 5/15 (ADDed electrolytes) at 83 ml/hr + lipids at 10 ml/hr on Mon/Fri only to minimize kCal provision. - TPN  also provides ~2L fluid per day. Renal aware. - Daily IV multivitamin in TPN and trace elements M/W/F with national shortage -f/u am labs  Excell Seltzer, PharmD Clinical Staff Pharmacist Pager [phone]  08/16/2013, 7:27 AM

## 2013-08-16 NOTE — Progress Notes (Signed)
PULMONARY / CRITICAL CARE MEDICINE   Name: Leah [name] MRN: [id number] DOB: [birth year]    ADMISSION DATE:  19-Aug-2013  REFERRING MD :  [facility]  PRIMARY SERVICE: PCCM  CHIEF COMPLAINT:  Respiratory Failure   BRIEF PATIENT DESCRIPTION: 73 y/o admitted to [facility] for planned repair of enterocolonic fistula.  Course was complicated by ileus, renal failure, cardiopulmonary arrest.  Transferred to [facility] on 3/4 for further care.   LINES / TUBES: R IJ TLC 2/14 >> 3/04 ETT([place])  2/23 >> 08/14/13, 3/11 (trach by JY) >> R IJ HD cath 3/04 >>  L IJ TLC 3/04 >>    CULTURES: ?date [facility]) >>> ENTEROCOCCUS FAECALIS 3/4 MRSA PCR >>> neg 3/4 Urine >>>  Multiple bacterial morphotypes  3/5 Blood >>  neg     ANTIBIOTICS: Vancomycin 3/4 >> 3/08 Imipenem 3/4 >>  Mycafungin 3/12 >>  SIGNIFICANT EVENTS / STUDIES:  2/13  OR >>> Ex-lap with small bowel resection, extensive lysis of adhesions, vomiting 2/22  Cardiac arrest - VDRF/Intubated 08-19-2013 - ADMIT [facility] 3/05  TTE: EF 35-40, grade 2 diastolic dysfunction  3/05  CVVHD started 3/8 - No new issues. Tolerates PS 10 cm H2O 08/12/13: 40# off since [facility] with [facility]. Still anuric. Patient is DNAR but full medical care. On levophed .. Family at bedside 08/13/13: No sedation but on levophed . RN says occ opens eyes but unresponsive to this MD (last fentanyl was 1aM)  08/14/13: For Trach today. WC upa at 40; worsening LLQ enteric fistula iwtih collection on CT; high risk for re-exploration per CCS. Still on levophed at . On CRRT. Is overbreathing vent; opens eyes   08/15/13: s/p trach now. Worsening shock; levophed and neo. LEss responsive. CCS thinks only option for fistula is drainage through ostomy bag.  WC high   SUBJECTIVE/OVERNIGHT/INTERVAL HX 08/16/13: On 3 pressors. Severe septic shock. . Still only minimally responsive. Still anuric.   VITAL SIGNS: Temp:  [97.4 F  (36.3 C)-98.7 F (37.1 C)] 97.9 F (36.6 C) (03/13 0841) Pulse Rate:  [69-123] 84 (03/13 0900) Resp:  [20-55] 37 (03/13 0900) BP: (85-122)/(31-66) 113/64 mmHg (03/13 0900) SpO2:  [93 %-100 %] 100 % (03/13 0900) Arterial Line BP: (73-134)/(29-53) 118/44 mmHg (03/13 0900) FiO2 (%):  [30 %] 30 % (03/13 0900) Weight:  [64.5 kg (142 lb 3.2 oz)] 64.5 kg (142 lb 3.2 oz) (03/13 0400)  HEMODYNAMICS: CVP:  [3 mmHg-5 mmHg] 5 mmHg VENTILATOR SETTINGS: Vent Mode:  [-] PRVC FiO2 (%):  [30 %] 30 % Set Rate:  [16 bmp] 16 bmp Vt Set:  [380 mL] 380 mL PEEP:  [5 cmH20] 5 cmH20 Plateau Pressure:  [11 cmH20-14 cmH20] 11 cmH20  INTAKE / OUTPUT: Intake/Output     03/12 0701 - 03/13 0700 03/13 0701 - 03/14 0700   I.V. (mL/kg) 2494.1 (38.7) 256.3 (4)   NG/GT 60    IV Piggyback 1300 700   TPN 2092 249   Total Intake(mL/kg) 5946.1 (92.2) 1205.3 (18.7)   Urine (mL/kg/hr)     Emesis/NG output 150 (0.1)    Drains 85 (0.1)    Other 4400 (2.8) 634 (3.1)   Total Output 4635 634   Net +1311.1 +571.3         PHYSICAL EXAMINATION: General:  Very deconditioned. Critically ill looking Neuro: Diffusely weak, no focal deficits, RASS -3 on no sedation; worse, blinked eyes to voice command HEENT: WNL Cardiovascular:  RRR s M Lungs: clear anteriorly Abdomen:  BS absent, LLQ colostomy, RLQ fistula draining serous fluid Ext:  2+ symmetric UE and LE edema   PULMONARY  Recent Labs Lab 08/11/13 1827 08/12/13 0325  PHART 7.233* 7.392  PCO2ART 53.9* 36.8  PO2ART 96.8 73.9*  HCO3 22.1 22.0  TCO2 23.8 23.2  O2SAT 97.0 95.6    CBC  Recent Labs Lab 08/14/13 0430 08/15/13 0440 08/16/13 0442  HGB 7.8* 7.4* 7.1*  HCT 24.0* 22.8* 23.0*  WBC 38.9* 33.7* 27.8*  PLT 108* 81* 66*    COAGULATION  Recent Labs Lab 08/14/13 1210  INR 1.16    CARDIAC    Recent Labs Lab 08/15/13 1118 08/16/13 0442  TROPONINI <0.30 0.55*   No results found for this basename: PROBNP,  in the last 168  hours   CHEMISTRY  Recent Labs Lab 08/12/13 0505  08/13/13 0507  08/14/13 0430 08/14/13 1519 08/15/13 0440 08/15/13 1600 08/16/13 0442  NA 129*  < > 129*  < > 130* 131* 132* 130* 130*  K 3.4*  < > 4.6  < > 4.6 4.5 4.4 4.3 4.7  CL 95*  < > 95*  < > 97 97 97 97 95*  CO2 21  < > 21  < > 21 23 21 21 22   GLUCOSE 227*  < > 200*  < > 177* 162* 192* 199* 196*  BUN 38*  < > 38*  < > 36* 36* 36* 31* 30*  CREATININE 0.58  < > 0.48*  < > 0.41* 0.40* 0.37* 0.36* 0.38*  CALCIUM 7.7*  < > 7.8*  < > 8.2* 8.1* 7.8* 7.7* 8.0*  MG 2.1  --  2.2  --  2.3  --  2.3  --  2.4  PHOS 1.5*  < > 2.1*  < > 2.5 2.7 2.4 2.4 2.7  < > = values in this interval not displayed. Estimated Creatinine Clearance: 55.5 ml/min (by C-G formula based on Cr of 0.38).   LIVER  Recent Labs Lab 08/11/13 0512  08/12/13 0505  08/14/13 0430 08/14/13 1210 08/14/13 1519 08/15/13 0440 08/15/13 1600 08/16/13 0442  AST 899*  --  714*  --   --   --   --   --   --  164*  ALT 86*  --  120*  --   --   --   --   --   --  75*  ALKPHOS 120*  --  119*  --   --   --   --   --   --  199*  BILITOT 1.9*  --  1.9*  --   --   --   --   --   --  4.8*  PROT 5.0*  --  4.8*  --   --   --   --   --   --  4.8*  ALBUMIN 1.8*  < > 1.7*  < > 1.5*  --  1.6* 1.5* 1.3* 1.4*  INR  --   --   --   --   --  1.16  --   --   --   --   < > = values in this interval not displayed.   INFECTIOUS  Recent Labs Lab 08/14/13 1207 08/15/13 0440 08/15/13 1117 08/16/13 0442  LATICACIDVEN  --   --  2.8*  --   PROCALCITON 4.38 4.91  --  7.95     ENDOCRINE CBG (last 3)   Recent Labs  08/15/13 2345 08/16/13 0417 08/16/13 1610  GLUCAP 177* 173* 209*         IMAGING x48h  Dg Chest Port 1 View  08/16/2013   CLINICAL DATA:  Intubation.  EXAM: PORTABLE CHEST - 1 VIEW  COMPARISON:  DG CHEST 1V PORT dated 08/15/2013; DG CHEST 1V PORT dated 08/13/2013  FINDINGS: Tracheostomy tube, NG tube, right and left IJ lines in stable position. Heart size  stable. Prior CABG. Persistent unchanged bilateral prominent pulmonary infiltrates are present. Atelectatic changes again noted in both lung bases. Small left pleural effusion cannot be excluded. No pneumothorax.  IMPRESSION: 1. Persistent bilateral dense pulmonary infiltrates with basilar atelectasis. 2. Prior CABG. Heart size and pulmonary vascularity stable. No pulmonary venous congestion. 3. Line and tube positions stable.   Electronically Signed   By: Maisie Fushomas  Register   On: 08/16/2013 08:09   Dg Chest Port 1 View  08/15/2013   CLINICAL DATA Shortness of breath.  Intubation.  EXAM PORTABLE CHEST - 1 VIEW  COMPARISON DG CHEST 1V PORT dated 08/14/2013  FINDINGS Tracheostomy tube, NG tube, right IJ line, left IJ line in stable position. Prior CABG. Cardiac size and pulmonary vascularity stable. Bilateral pulmonary alveolar infiltrates are again noted. Atelectatic changes both lung bases.  IMPRESSION 1. Stable line and tube positions . 2. Persistent unchanged bilateral pulmonary alveolar infiltrates with basilar atelectasis. 3. Prior CABG.  Cardiac size normal.  SIGNATURE  Electronically Signed   By: Maisie Fushomas  Register   On: 08/15/2013 07:28   Chest Portable 1 View To Assess Tube Placement And Rule-out Pneumothorax  08/14/2013   CLINICAL DATA Tracheostomy placement  EXAM PORTABLE CHEST - 1 VIEW  COMPARISON 08/13/2013  FINDINGS Tracheostomy has been placed in good position.  No pneumothorax  Bilateral jugular central venous catheter tips in the SVC, unchanged.  Mild improvement in bibasilar atelectasis/infiltrate.  IMPRESSION Satisfactory tracheostomy placement without pneumothorax.  Mild improvement in bibasilar airspace disease.  SIGNATURE  Electronically Signed   By: Marlan Palauharles  Clark M.D.   On: 08/14/2013 15:10   Ct Portable Head W/o Cm  08/15/2013   CLINICAL DATA:  Decreased responsiveness.  Altered mental status.  EXAM: CT HEAD WITHOUT CONTRAST  TECHNIQUE: Contiguous axial images were obtained from the base  of the skull through the vertex without contrast.  COMPARISON:  None  FINDINGS: Study was performed with the CT portable apparatus. Patient is intubated.  No evidence for acute infarction, hemorrhage, mass lesion, hydrocephalus, or extra-axial fluid. Moderate cerebral and cerebellar atrophy. Chronic microvascular ischemic change is suspected.  Small focus of hypoattenuation left thalamus, likely chronic lacune. Asymmetric hypoattenuation left inferior cerebellum is likely artifactual, although an indeterminate age cerebellar infarct could have this appearance. No other areas suspicious for posterior fossa ischemia.  Vascular calcification. Air-fluid level in both divisions of the sphenoid sinus could be secondary to prolonged supine positioning or reflect sinus disease. Bilateral mastoid effusions are suspected. No focal osseous lesions. Vascular calcification.  IMPRESSION: Chronic changes as described. No acute intracranial abnormality is detected.   Electronically Signed   By: Davonna BellingJohn  Curnes M.D.   On: 08/15/2013 21:15      ASSESSMENT / PLAN:  PULMONARY A: Acute and chronic respiratory failure with prolonged mech vent/chronic critical illness  - s/p trach 08/14/13 COPD without exacerbation LLL PNA on CT abd 08/13/13   - Failure to wean due to severe physical deconditioning, septick shock and poor mental status P:   Full vent supprt   CARDIOVASCULAR A:  Septic shock Cardiogenic shock S/p Cardiopulmonary arrest Acute on chronic systolic  and diastolic CHF NSTEMI 2/23 [facility])   -severe shock, likely septic. CRRT running even  P:  Fluid bolus Wean NE to off for MAP > 65 mmHg Check [id number]  RENAL A:   AKI, anuric Hyperkalemia, resolved Anasasrca   - still anuric. 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 CT head negative for acute abnormalities P:   Goal RASS 0 to -1 Cont PRN fentanyl   GLOBAL  08/14/13: detailed goals of care with husband his sister: he is emotionally torn. He understands very poor prognosis but does not want to give up yet. Agreed to tracheostomy with time limited trial of few to several week; if no recovery then palliate  08/15/13: worsening shock, likely septic. Add mycafungin. Give fluids. Explained to husband she is worse. Prepared him for possible death if she does not turn around  08/16/13: uopdated husband and sister. Spiritual consult appreciated. She unlikley to survie this illness  I have personally obtained history, examined patient, evaluated and interpreted laboratory and imaging results, reviewed medical records, formulated assessment / plan and placed orders.  CRITICAL CARE:  The patient is critically ill with multiple organ systems failure and requires high complexity decision making for assessment and support, frequent evaluation and titration of therapies, application of advanced monitoring technologies and extensive interpretation of multiple databases. Critical Care Time devoted to patient care services described in this note is 40 minutes.   Dr. Kalman Shan, M.D., [facility].C.P Pulmonary and Critical Care Medicine Staff Physician [facility] [place] [facility]monary and Critical Care Pager: [phone], If no answer or between  15:00h - 7:00h: call [phone]  08/16/2013 10:13 AM

## 2013-08-17 DIAGNOSIS — R6521 Severe sepsis with septic shock: Secondary | ICD-10-CM

## 2013-08-17 DIAGNOSIS — A419 Sepsis, unspecified organism: Secondary | ICD-10-CM

## 2013-08-17 LAB — CBC WITH DIFFERENTIAL/PLATELET
BASOS ABS: 0 10*3/uL (ref 0.0–0.1)
BASOS PCT: 0 % (ref 0–1)
EOS ABS: 0.2 10*3/uL (ref 0.0–0.7)
Eosinophils Relative: 1 % (ref 0–5)
HEMATOCRIT: 20.3 % — AB (ref 36.0–46.0)
HEMOGLOBIN: 6.5 g/dL — AB (ref 12.0–15.0)
LYMPHS ABS: 1.2 10*3/uL (ref 0.7–4.0)
Lymphocytes Relative: 6 % — ABNORMAL LOW (ref 12–46)
MCH: 31.3 pg (ref 26.0–34.0)
MCHC: 32 g/dL (ref 30.0–36.0)
MCV: 97.6 fL (ref 78.0–100.0)
MONO ABS: 1.5 10*3/uL — AB (ref 0.1–1.0)
Monocytes Relative: 8 % (ref 3–12)
NEUTROS ABS: 16.3 10*3/uL — AB (ref 1.7–7.7)
Neutrophils Relative %: 85 % — ABNORMAL HIGH (ref 43–77)
Platelets: 53 10*3/uL — ABNORMAL LOW (ref 150–400)
RBC: 2.08 MIL/uL — ABNORMAL LOW (ref 3.87–5.11)
RDW: 25.8 % — AB (ref 11.5–15.5)
WBC: 19.2 10*3/uL — ABNORMAL HIGH (ref 4.0–10.5)

## 2013-08-17 LAB — BASIC METABOLIC PANEL
BUN: 31 mg/dL — AB (ref 6–23)
CALCIUM: 7.9 mg/dL — AB (ref 8.4–10.5)
CO2: 20 meq/L (ref 19–32)
Chloride: 94 mEq/L — ABNORMAL LOW (ref 96–112)
Creatinine, Ser: 0.39 mg/dL — ABNORMAL LOW (ref 0.50–1.10)
GFR calc Af Amer: >90 mL/min (ref >90)
GLUCOSE: 200 mg/dL — AB (ref 70–99)
Potassium: 4.6 mEq/L (ref 3.7–5.3)
Sodium: 128 mEq/L — ABNORMAL LOW (ref 137–147)

## 2013-08-17 LAB — GLUCOSE, CAPILLARY
GLUCOSE-CAPILLARY: 184 mg/dL — AB (ref 70–99)
GLUCOSE-CAPILLARY: 177 mg/dL — AB (ref 70–99)
Glucose-Capillary: 158 mg/dL — ABNORMAL HIGH (ref 70–99)
Glucose-Capillary: 170 mg/dL — ABNORMAL HIGH (ref 70–99)
Glucose-Capillary: 173 mg/dL — ABNORMAL HIGH (ref 70–99)
Glucose-Capillary: 180 mg/dL — ABNORMAL HIGH (ref 70–99)

## 2013-08-17 LAB — TROPONIN I: Troponin I: 0.79 ng/mL (ref <0.30)

## 2013-08-17 LAB — PREPARE RBC (CROSSMATCH)

## 2013-08-17 LAB — PHOSPHORUS: PHOSPHORUS: 2.8 mg/dL (ref 2.3–4.6)

## 2013-08-17 LAB — MAGNESIUM: Magnesium: 2.4 mg/dL (ref 1.5–2.5)

## 2013-08-17 LAB — CORTISOL: Cortisol, Plasma: 23.1 ug/dL

## 2013-08-17 LAB — ABO/RH: ABO/RH(D): A POS

## 2013-08-17 MED ORDER — SODIUM CHLORIDE 0.9 % IV BOLUS (SEPSIS): 2000.0000 mL | Freq: Once | INTRAVENOUS | Status: DC

## 2013-08-17 MED ORDER — FAT EMULSION 20 % IV EMUL
250.0000 mL | INTRAVENOUS | Status: DC
  Filled 2013-08-17: qty 250

## 2013-08-17 MED ORDER — HYDROCORTISONE NA SUCCINATE PF 100 MG IJ SOLR
50.0000 mg | Freq: Four times a day (QID) | INTRAMUSCULAR | Status: DC
  Administered 2013-08-17 – 2013-08-23 (×23): 50 mg via INTRAVENOUS
  Filled 2013-08-17 (×29): qty 1

## 2013-08-17 MED ORDER — M.V.I. ADULT IV INJ
INTRAVENOUS | Status: AC
  Administered 2013-08-17: 17:00:00 via INTRAVENOUS
  Filled 2013-08-17: qty 2000

## 2013-08-17 NOTE — Progress Notes (Signed)
CRITICAL VALUE ALERT  Critical value received: hgb 6.5  Date of notification: 08-17-13  Time of notification:  0302  Critical value read back:yes  Nurse who received alert:  Jamesetta Soarla Amiley Shishido RN  MD notified (1st page):  Shelba FlakeElizabeth Deterding MD  Time of first page:  0303  Responding MD:  Shelba FlakeElizabeth Deterding MD  Time MD responded:  [phone]

## 2013-08-17 NOTE — Progress Notes (Signed)
eLink Physician-Brief Progress Note Patient Name: Leah FurlongSandra K Rucinski DOB: 06/03/1941 MRN: [id number]  Date of Service  08/17/2013   HPI/Events of Note  Hgb down to 6.5 from 7.1   eICU Interventions  Plan:  Transfuse 1 u pRBC Post-transfusion CBC   Intervention Category Intermediate Interventions: Bleeding - evaluation and treatment with blood products  DETERDING,ELIZABETH 08/17/2013, 3:05 AM

## 2013-08-17 NOTE — Progress Notes (Signed)
CVVHD management.  Metabolically stable.  HG 6.5, to get transfused.  Still on 3 pressors.  Cont to keep even on fluid.  Bicarb sl lower at 20, will follow closely.

## 2013-08-17 NOTE — Progress Notes (Signed)
PARENTERAL NUTRITION CONSULT NOTE:  FOLLOW-UP  Pharmacy Consult:  TPN Indication: Suspect TF intolerance d/t to severe bowel edema  Allergies  Allergen Reactions  . Cephalexin Other (See Comments)    Unknown  . Codeine Nausea And Vomiting    Patient Measurements: Height: 5\' 1"  (154.9 cm) Weight: 142 lb 3.2 oz (64.5 kg) IBW/kg (Calculated) : 47.8  Usual Weight: 55 kg (this was admit weight at [facility] 2/13)  Vital Signs: Temp: 98.8 F (37.1 C) (03/14 0705) Temp src: Axillary (03/14 0705) BP: 103/53 mmHg (03/14 0700) Pulse Rate: 120 (03/14 0705) Intake/Output from previous day: 03/13 0701 - 03/14 0700 In: 6060.4 [I.V.:2938.4; IV Piggyback:1000; TPN:2122] Out: 5256 [Emesis/NG output:200; Drains:147]  Labs:  Recent Labs  08/14/13 1210 08/15/13 0440 08/16/13 0442 08/17/13 0239  WBC  --  33.7* 27.8* 19.2*  HGB  --  7.4* 7.1* 6.5*  HCT  --  22.8* 23.0* 20.3*  PLT  --  81* 66* 53*  APTT 51*  --   --   --   INR 1.16  --   --   --      Recent Labs  08/15/13 0440 08/15/13 1600 08/16/13 0442 08/16/13 1600 08/17/13 0239  NA 132* 130* 130* 129* 128*  K 4.4 4.3 4.7 4.6 4.6  CL 97 97 95* 95* 94*  CO2 21 21 22 22 20   GLUCOSE 192* 199* 196* 159* 200*  BUN 36* 31* 30* 33* 31*  CREATININE 0.37* 0.36* 0.38* 0.41* 0.39*  CALCIUM 7.8* 7.7* 8.0* 8.0* 7.9*  MG 2.3  --  2.4  --  2.4  PHOS 2.4 2.4 2.7 3.1 2.8  PROT  --   --  4.8*  --   --   ALBUMIN 1.5* 1.3* 1.4* 1.3*  --   AST  --   --  164*  --   --   ALT  --   --  75*  --   --   ALKPHOS  --   --  199*  --   --   BILITOT  --   --  4.8*  --   --    Estimated Creatinine Clearance: 55.5 ml/min (by C-G formula based on Cr of 0.39).    Recent Labs  08/16/13 2358 08/17/13 0428 08/17/13 0715  GLUCAP 170* 173* 184*     Insulin Requirements in the past 24 hours:  20 units moderate SSI + 67 units regular insulin in TPN with CBGs 157-209  Assessment: 72 YOF admitted 07/19/13 to [facility] for planned repair of  enterocolonic fistula. Post-op course complicated by ileus, renal failure, and cardiopulmonary arrest.  Patient transferred to [facility] on 08/05/2013.  GI: bowels are too edematous to tolerate TF and may trickle feed when volume status improves per Surgery. Baseline prealbumin was 15.6 now 12.3. TG 138. NG O/P 200, CT: Enteric fistula increased in size. Drains 85.Abdomen with open wound and bilious drainage from fistula   Endo: no hx DM - requiring significant amounts of insulin. CBGs still >goal.  Lytes: Low Na 128 (stable) Cl low 94 and Phos 2.8 today. Ca 8.0 adjusts to 10.08.  Renal: hx CKD started on CRRT since 08/08/13 for volume regulation - SCr/BUN improving, up 60-70lbs since admit with diffuse anasarca (55kg at [place], initial weight at [facility] was 86kg, now at 64.5kg).  NS at 10 ml/hr. Minimal to no UOP. TPN providing 2 g/kg/day of protein while on CRRT.   Pulm: intubated at [facility] - FiO2 30% (stable). Now trached  Cards: AoC  dHF (EF 35-40%), s/p cardiac arrest at [facility] 2/22.  Norepi, levophed and vasopressin for worsening shock. Noted troponins elevated overnight.  Hepatobil: AST 899>>164, ALT 86>>75, Tbili increased to 4.8, alk phos 199  Neuro: less responsive  Heme: Transfuse for Hgb 6.5  ID: Primaxin/Micafungin for intra-abd infxn + Enterococcus UTI, afebrile, WBC back down to 19.2. Noted PC 4.91  Best Practices: PPI IV, MC, SCDs on patient.  TPN Access: CVC placed 08/04/2013  TPN day#: PTA from [facility] since 2/27   Current Nutrition:  - TPN at 43ml/hr and Lipids 44ml/hr on Mon/Fri (decrease to 2d per week to prevent essential FA def) provides an average of 1551 kCal and 100gm protein daily.  Nutritional Goals:  1393 kCal (goal for permissive underfeeding ~1200 kCal); protein 115 gm per day- RD note 3/11   Plan:  - Continue Clinimix E 5/15 (ADDed electrolytes) at 83 ml/hr + lipids at 10 ml/hr on Mon/Fri only to minimize kCal provision. - TPN also provides ~2L fluid per day.  Renal aware. - Daily IV multivitamin in TPN and trace elements M/W/F with national shortage - Increase insulin in TPN    Libbi Towner S. Alford Highland, PharmD, [place] Clinical Staff Pharmacist Pager [phone]   08/17/2013, 8:14 AM

## 2013-08-17 NOTE — Progress Notes (Signed)
Subjective: No acute changes. PRBC this AM  Objective: Vital signs in last 24 hours: Temp:  [97.6 F (36.4 C)-98.8 F (37.1 C)] 98.8 F (37.1 C) (03/14 0705) Pulse Rate:  [66-120] 120 (03/14 0705) Resp:  [21-42] 42 (03/14 0705) BP: (82-118)/(25-69) 103/53 mmHg (03/14 0700) SpO2:  [97 %-100 %] 99 % (03/14 0705) Arterial Line BP: (90-129)/(35-53) 117/52 mmHg (03/14 0705) FiO2 (%):  [30 %] 30 % (03/14 0417) Weight:  [142 lb 3.2 oz (64.5 kg)] 142 lb 3.2 oz (64.5 kg) (03/14 0449) Last BM Date: 08/14/13  Intake/Output from previous day: 03/13 0701 - 03/14 0700 In: 6060.4 [I.V.:2938.4; IV Piggyback:1000; TPN:2122] Out: 5256 [Emesis/NG output:200; Drains:147] Intake/Output this shift: Total I/O In: 12.5 [Blood:12.5] Out: -   General appearance: sedated Cardio: tachy, RR GI: soft, ND, fistula with succus drainge  Lab Results:   Recent Labs  08/16/13 0442 08/17/13 0239  WBC 27.8* 19.2*  HGB 7.1* 6.5*  HCT 23.0* 20.3*  PLT 66* 53*   BMET  Recent Labs  08/16/13 1600 08/17/13 0239  NA 129* 128*  K 4.6 4.6  CL 95* 94*  CO2 22 20  GLUCOSE 159* 200*  BUN 33* 31*  CREATININE 0.41* 0.39*  CALCIUM 8.0* 7.9*   PT/INR  Recent Labs  08/14/13 1210  LABPROT 14.6  INR 1.16   ABG No results found for this basename: PHART, PCO2, PO2, HCO3,  in the last 72 hours  Studies/Results: Dg Chest Port 1 View  08/16/2013   CLINICAL DATA:  Intubation.  EXAM: PORTABLE CHEST - 1 VIEW  COMPARISON:  DG CHEST 1V PORT dated 08/15/2013; DG CHEST 1V PORT dated 08/13/2013  FINDINGS: Tracheostomy tube, NG tube, right and left IJ lines in stable position. Heart size stable. Prior CABG. Persistent unchanged bilateral prominent pulmonary infiltrates are present. Atelectatic changes again noted in both lung bases. Small left pleural effusion cannot be excluded. No pneumothorax.  IMPRESSION: 1. Persistent bilateral dense pulmonary infiltrates with basilar atelectasis. 2. Prior CABG. Heart size  and pulmonary vascularity stable. No pulmonary venous congestion. 3. Line and tube positions stable.   Electronically Signed   By: Maisie Fus  Register   On: 08/16/2013 08:09   Ct Portable Head W/o Cm  08/15/2013   CLINICAL DATA:  Decreased responsiveness.  Altered mental status.  EXAM: CT HEAD WITHOUT CONTRAST  TECHNIQUE: Contiguous axial images were obtained from the base of the skull through the vertex without contrast.  COMPARISON:  None  FINDINGS: Study was performed with the CT portable apparatus. Patient is intubated.  No evidence for acute infarction, hemorrhage, mass lesion, hydrocephalus, or extra-axial fluid. Moderate cerebral and cerebellar atrophy. Chronic microvascular ischemic change is suspected.  Small focus of hypoattenuation left thalamus, likely chronic lacune. Asymmetric hypoattenuation left inferior cerebellum is likely artifactual, although an indeterminate age cerebellar infarct could have this appearance. No other areas suspicious for posterior fossa ischemia.  Vascular calcification. Air-fluid level in both divisions of the sphenoid sinus could be secondary to prolonged supine positioning or reflect sinus disease. Bilateral mastoid effusions are suspected. No focal osseous lesions. Vascular calcification.  IMPRESSION: Chronic changes as described. No acute intracranial abnormality is detected.   Electronically Signed   By: Davonna Belling M.D.   On: 08/15/2013 21:15    Anti-infectives: Anti-infectives   Start     Dose/Rate Route Frequency Ordered Stop   08/15/13 1200  micafungin (MYCAMINE) 100 mg in sodium chloride 0.9 % 100 mL IVPB     100 mg 100 mL/hr over 1  Hours Intravenous Daily 08/15/13 1134     08/10/13 0600  imipenem-cilastatin (PRIMAXIN) 250 mg in sodium chloride 0.9 % 100 mL IVPB     250 mg 200 mL/hr over 30 Minutes Intravenous 3 times per day 08/09/13 0021     08/09/13 1000  imipenem-cilastatin (PRIMAXIN) 250 mg in sodium chloride 0.9 % 100 mL IVPB  Status:  Discontinued      250 mg 200 mL/hr over 30 Minutes Intravenous Every 12 hours 08/09/13 0019 08/09/13 0021   08/08/13 1000  micafungin (MYCAMINE) 100 mg in sodium chloride 0.9 % 100 mL IVPB  Status:  Discontinued     100 mg 100 mL/hr over 1 Hours Intravenous Daily 08/08/13 0950 08/08/13 1001   08/08/2013 1800  vancomycin (VANCOCIN) IVPB 1000 mg/200 mL premix  Status:  Discontinued     1,000 mg 200 mL/hr over 60 Minutes Intravenous Every 24 hours 08/26/2013 1625 08/11/13 1057   08/14/2013 1700  imipenem-cilastatin (PRIMAXIN) 250 mg in sodium chloride 0.9 % 100 mL IVPB  Status:  Discontinued     250 mg 200 mL/hr over 30 Minutes Intravenous 3 times per day 08/19/2013 1624 08/09/13 0019      Assessment/Plan: 1. EC fistula s/p ex lap with SBR and LOA [facility]  Con't current care  Fistula mgmt with ostomy bag Prognosis poor   LOS: 10 days    Marigene Ehlersamirez Jr., [facility] 08/17/2013

## 2013-08-17 NOTE — Progress Notes (Signed)
PULMONARY / CRITICAL CARE MEDICINE   Name: Leah [name] MRN: [id number] DOB: [birth year]    ADMISSION DATE:  09/01/2013  REFERRING MD :  [facility]  PRIMARY SERVICE: PCCM  CHIEF COMPLAINT:  Respiratory Failure   BRIEF PATIENT DESCRIPTION: 73 y/o admitted to [facility] for planned repair of enterocolonic fistula.  Course was complicated by ileus, renal failure, cardiopulmonary arrest.  Transferred to [facility] on 3/4 for further care.   LINES / TUBES: R IJ TLC 2/14 >> 3/04 ETT([place])  2/23 >> 08/14/13, 3/11 (trach by JY) >> R IJ HD cath 3/04 >>  L IJ TLC 3/04 >>    CULTURES: ?date [facility]) >>> ENTEROCOCCUS FAECALIS 3/4 MRSA PCR >>> neg 3/4 Urine >>>  Multiple bacterial morphotypes  3/5 Blood >>  neg     ANTIBIOTICS: Vancomycin 3/4 >> 3/08 Imipenem 3/4 >>  Mycafungin 3/12 >>  SIGNIFICANT EVENTS / STUDIES:  2/13  OR >>> Ex-lap with small bowel resection, extensive lysis of adhesions, vomiting 2/22  Cardiac arrest - VDRF/Intubated 08/10/2013 - ADMIT [facility] 3/05  TTE: EF 35-40, grade 2 diastolic dysfunction  3/05  CVVHD started 3/8 - No new issues. Tolerates PS 10 cm H2O 08/12/13: 40# off [facility]. Still anuric. Patient is DNAR but full medical care. On levophed .. Family at bedside 08/13/13: No sedation but on levophed . RN says occ opens eyes but unresponsive to this MD (last fentanyl was 1aM)  08/14/13: For Trach today. WC upa at 40; worsening LLQ enteric fistula iwtih collection on CT; high risk for re-exploration per CCS. Still on levophed at . On CRRT. Is overbreathing vent; opens eyes   08/15/13: s/p trach now. Worsening shock; levophed and neo. LEss responsive. CCS thinks only option for fistula is drainage through ostomy bag.  WC high  08/16/13: On 3 pressors. Severe septic shock. . Still only minimally responsive. Still anuric.     SUBJECTIVE/OVERNIGHT/INTERVAL HX 08/17/13: Still on 3 pressors.  Following commands  VITAL SIGNS: Temp:  [96.6 F (35.9 C)-98.8 F (37.1 C)] 96.6 F (35.9 C) (03/14 1128) Pulse Rate:  [80-120] 94 (03/14 1400) Resp:  [21-42] 30 (03/14 1400) BP: (82-118)/(25-57) 116/50 mmHg (03/14 1223) SpO2:  [96 %-100 %] 98 % (03/14 1400) Arterial Line BP: (90-122)/(38-52) 111/52 mmHg (03/14 1400) FiO2 (%):  [30 %] 30 % (03/14 1400) Weight:  [64.5 kg (142 lb 3.2 oz)] 64.5 kg (142 lb 3.2 oz) (03/14 0449)  HEMODYNAMICS: CVP:  [9 mmHg] 9 mmHg VENTILATOR SETTINGS: Vent Mode:  [-] PRVC FiO2 (%):  [30 %] 30 % Set Rate:  [16 bmp] 16 bmp Vt Set:  [380 mL] 380 mL PEEP:  [5 cmH20] 5 cmH20 Plateau Pressure:  [13 cmH20-24 cmH20] 22 cmH20  INTAKE / OUTPUT: Intake/Output     03/13 0701 - 03/14 0700 03/14 0701 - 03/15 0700   I.V. (mL/kg) 2938.4 (45.6) 928.3 (14.4)   Blood  12.5   NG/GT     IV Piggyback 1000 100   TPN 2122 651   Total Intake(mL/kg) 6060.4 (94) 1691.8 (26.2)   Emesis/NG output 200    Drains 147    Other 4909 1599   Total Output 5256 1599   Net +804.4 +92.8         PHYSICAL EXAMINATION: General:  Very deconditioned. Critically ill looking Neuro: Diffusely weak, no focal deficits, RASS -3 on no sedation; worse, blinked eyes to voice command (RN says follows commands) HEENT: WNL Cardiovascular:  RRR  s M Lungs: clear anteriorly Abdomen:  BS absent, LLQ colostomy, RLQ fistula draining serous fluid Ext:  2+ symmetric UE and LE edema   PULMONARY  Recent Labs Lab 08/11/13 1827 08/12/13 0325 08/16/13 1113  PHART 7.233* 7.392  --   PCO2ART 53.9* 36.8  --   PO2ART 96.8 73.9*  --   HCO3 22.1 22.0  --   TCO2 23.8 23.2  --   O2SAT 97.0 95.6 61.9    CBC  Recent Labs Lab 08/15/13 0440 08/16/13 0442 08/17/13 0239  HGB 7.4* 7.1* 6.5*  HCT 22.8* 23.0* 20.3*  WBC 33.7* 27.8* 19.2*  PLT 81* 66* 53*    COAGULATION  Recent Labs Lab 08/14/13 1210  INR 1.16    CARDIAC    Recent Labs Lab 08/15/13 1118 08/16/13 0442 08/16/13 1200  08/16/13 1838 08/17/13 0238  TROPONINI <0.30 0.55* 0.62* 0.54* 0.79*   No results found for this basename: PROBNP,  in the last 168 hours   CHEMISTRY  Recent Labs Lab 08/13/13 0507  08/14/13 0430  08/15/13 0440 08/15/13 1600 08/16/13 0442 08/16/13 1600 08/17/13 0239  NA 129*  < > 130*  < > 132* 130* 130* 129* 128*  K 4.6  < > 4.6  < > 4.4 4.3 4.7 4.6 4.6  CL 95*  < > 97  < > 97 97 95* 95* 94*  CO2 21  < > 21  < > 21 21 22 22 20   GLUCOSE 200*  < > 177*  < > 192* 199* 196* 159* 200*  BUN 38*  < > 36*  < > 36* 31* 30* 33* 31*  CREATININE 0.48*  < > 0.41*  < > 0.37* 0.36* 0.38* 0.41* 0.39*  CALCIUM 7.8*  < > 8.2*  < > 7.8* 7.7* 8.0* 8.0* 7.9*  MG 2.2  --  2.3  --  2.3  --  2.4  --  2.4  PHOS 2.1*  < > 2.5  < > 2.4 2.4 2.7 3.1 2.8  < > = values in this interval not displayed. Estimated Creatinine Clearance: 55.5 ml/min (by C-G formula based on Cr of 0.39).   LIVER  Recent Labs Lab 08/11/13 0512  08/12/13 0505  08/14/13 0430 08/14/13 1210 08/14/13 1519 08/15/13 0440 08/15/13 1600 08/16/13 0442 08/16/13 1600  AST 899*  --  714*  --   --   --   --   --   --  164*  --   ALT 86*  --  120*  --   --   --   --   --   --  75*  --   ALKPHOS 120*  --  119*  --   --   --   --   --   --  199*  --   BILITOT 1.9*  --  1.9*  --   --   --   --   --   --  4.8*  --   PROT 5.0*  --  4.8*  --   --   --   --   --   --  4.8*  --   ALBUMIN 1.8*  < > 1.7*  < > 1.5*  --  1.6* 1.5* 1.3* 1.4* 1.3*  INR  --   --   --   --   --  1.16  --   --   --   --   --   < > = values in this interval not  displayed.   INFECTIOUS  Recent Labs Lab 08/14/13 1207 08/15/13 0440 08/15/13 1117 08/16/13 0442  LATICACIDVEN  --   --  2.8*  --   PROCALCITON 4.38 4.91  --  7.95     ENDOCRINE CBG (last 3)   Recent Labs  08/17/13 0428 08/17/13 0715 08/17/13 1100  GLUCAP 173* 184* 177*         IMAGING x48h  Dg Chest Port 1 View  08/16/2013   CLINICAL DATA:  Intubation.  EXAM: PORTABLE CHEST  - 1 VIEW  COMPARISON:  DG CHEST 1V PORT dated 08/15/2013; DG CHEST 1V PORT dated 08/13/2013  FINDINGS: Tracheostomy tube, NG tube, right and left IJ lines in stable position. Heart size stable. Prior CABG. Persistent unchanged bilateral prominent pulmonary infiltrates are present. Atelectatic changes again noted in both lung bases. Small left pleural effusion cannot be excluded. No pneumothorax.  IMPRESSION: 1. Persistent bilateral dense pulmonary infiltrates with basilar atelectasis. 2. Prior CABG. Heart size and pulmonary vascularity stable. No pulmonary venous congestion. 3. Line and tube positions stable.   Electronically Signed   By: Maisie Fus  Register   On: 08/16/2013 08:09   Ct Portable Head W/o Cm  08/15/2013   CLINICAL DATA:  Decreased responsiveness.  Altered mental status.  EXAM: CT HEAD WITHOUT CONTRAST  TECHNIQUE: Contiguous axial images were obtained from the base of the skull through the vertex without contrast.  COMPARISON:  None  FINDINGS: Study was performed with the CT portable apparatus. Patient is intubated.  No evidence for acute infarction, hemorrhage, mass lesion, hydrocephalus, or extra-axial fluid. Moderate cerebral and cerebellar atrophy. Chronic microvascular ischemic change is suspected.  Small focus of hypoattenuation left thalamus, likely chronic lacune. Asymmetric hypoattenuation left inferior cerebellum is likely artifactual, although an indeterminate age cerebellar infarct could have this appearance. No other areas suspicious for posterior fossa ischemia.  Vascular calcification. Air-fluid level in both divisions of the sphenoid sinus could be secondary to prolonged supine positioning or reflect sinus disease. Bilateral mastoid effusions are suspected. No focal osseous lesions. Vascular calcification.  IMPRESSION: Chronic changes as described. No acute intracranial abnormality is detected.   Electronically Signed   By: Davonna Belling M.D.   On: 08/15/2013 21:15      ASSESSMENT /  PLAN:  PULMONARY A: Acute and chronic respiratory failure with prolonged mech vent/chronic critical illness  - s/p trach 08/14/13 COPD without exacerbation LLL PNA on CT abd 08/13/13   - Failure to wean due to severe physical deconditioning, septic shock and poor mental status P:   Full vent supprt   CARDIOVASCULAR A:  Septic shock Cardiogenic shock S/p Cardiopulmonary arrest Acute on chronic systolic and diastolic CHF NSTEMI 2/23 [facility])   -severe shock, likely septic. CRRT running even. No improvement in days despite fluid bolus, prbc and adding mycafungin  P:  Fluid bolus x 2L Try hydrocort If fails, try adding dobutamine Wean NE to off for MAP > 65 mmHg   RENAL A:   AKI, anuric Hyperkalemia, resolved Anasasrca   - still anuric. 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 CT head negative for acute abnormalities P:   Goal RASS 0 to -1 Cont PRN fentanyl   GLOBAL  08/14/13: detailed goals of care with husband his sister: he is emotionally torn. He understands very poor prognosis but does not want to give up yet. Agreed to tracheostomy with time limited trial of few to several week; if no recovery then palliate  08/15/13: worsening shock, likely septic. Add mycafungin. Give fluids. Explained to husband she is worse. Prepared him for possible  death if she does not turn around  08/16/13: uopdated husband and sister. Spiritual consult appreciated. She unlikley to survie this illness  I have personally obtained history, examined patient, evaluated and interpreted laboratory and imaging results, reviewed medical records, formulated assessment / plan and placed orders.  CRITICAL CARE:  The patient is critically ill with multiple organ systems failure and requires high complexity decision making for assessment and support, frequent evaluation and titration of therapies, application of advanced monitoring technologies and extensive interpretation of multiple databases. Critical Care Time devoted to patient care services described in this note is 30minutes.   Dr. Kalman ShanMurali Alayjah Boehringer, M.D., [facility] CenterF.C.C.P Pulmonary and Critical Care Medicine Staff Physician [facility] Pager: [phone], If no answer or between  15:00h - 7:00h: call [phone]  08/17/2013 2:37 PM

## 2013-08-18 ENCOUNTER — Inpatient Hospital Stay (HOSPITAL_COMMUNITY): Payer: Medicare Other

## 2013-08-18 DIAGNOSIS — K632 Fistula of intestine: Secondary | ICD-10-CM

## 2013-08-18 DIAGNOSIS — A419 Sepsis, unspecified organism: Secondary | ICD-10-CM

## 2013-08-18 LAB — RENAL FUNCTION PANEL
ALBUMIN: 1.3 g/dL — AB (ref 3.5–5.2)
BUN: 34 mg/dL — ABNORMAL HIGH (ref 6–23)
CHLORIDE: 95 meq/L — AB (ref 96–112)
CO2: 22 mEq/L (ref 19–32)
CREATININE: 0.39 mg/dL — AB (ref 0.50–1.10)
Calcium: 8.2 mg/dL — ABNORMAL LOW (ref 8.4–10.5)
GFR calc Af Amer: >90 mL/min (ref >90)
Glucose, Bld: 141 mg/dL — ABNORMAL HIGH (ref 70–99)
Phosphorus: 3.4 mg/dL (ref 2.3–4.6)
Potassium: 4.9 mEq/L (ref 3.7–5.3)
Sodium: 128 mEq/L — ABNORMAL LOW (ref 137–147)

## 2013-08-18 LAB — CBC WITH DIFFERENTIAL/PLATELET
BASOS ABS: 0 10*3/uL (ref 0.0–0.1)
Basophils Relative: 0 % (ref 0–1)
Eosinophils Absolute: 0 10*3/uL (ref 0.0–0.7)
Eosinophils Relative: 0 % (ref 0–5)
HCT: 25.7 % — ABNORMAL LOW (ref 36.0–46.0)
Hemoglobin: 8.3 g/dL — ABNORMAL LOW (ref 12.0–15.0)
LYMPHS ABS: 0.7 10*3/uL (ref 0.7–4.0)
Lymphocytes Relative: 4 % — ABNORMAL LOW (ref 12–46)
MCH: 30.7 pg (ref 26.0–34.0)
MCHC: 32.3 g/dL (ref 30.0–36.0)
MCV: 95.2 fL (ref 78.0–100.0)
MONO ABS: 0.8 10*3/uL (ref 0.1–1.0)
Monocytes Relative: 5 % (ref 3–12)
Neutro Abs: 14.9 10*3/uL — ABNORMAL HIGH (ref 1.7–7.7)
Neutrophils Relative %: 91 % — ABNORMAL HIGH (ref 43–77)
PLATELETS: 48 10*3/uL — AB (ref 150–400)
RBC: 2.7 MIL/uL — ABNORMAL LOW (ref 3.87–5.11)
RDW: 24 % — AB (ref 11.5–15.5)
WBC: 16.4 10*3/uL — ABNORMAL HIGH (ref 4.0–10.5)

## 2013-08-18 LAB — TYPE AND SCREEN
ABO/RH(D): A POS
Antibody Screen: NEGATIVE
Unit division: 0

## 2013-08-18 LAB — BASIC METABOLIC PANEL
BUN: 32 mg/dL — AB (ref 6–23)
CHLORIDE: 93 meq/L — AB (ref 96–112)
CO2: 21 mEq/L (ref 19–32)
Calcium: 8.1 mg/dL — ABNORMAL LOW (ref 8.4–10.5)
Creatinine, Ser: 0.35 mg/dL — ABNORMAL LOW (ref 0.50–1.10)
Glucose, Bld: 151 mg/dL — ABNORMAL HIGH (ref 70–99)
Potassium: 4.9 mEq/L (ref 3.7–5.3)
Sodium: 126 mEq/L — ABNORMAL LOW (ref 137–147)

## 2013-08-18 LAB — GLUCOSE, CAPILLARY
GLUCOSE-CAPILLARY: 120 mg/dL — AB (ref 70–99)
GLUCOSE-CAPILLARY: 152 mg/dL — AB (ref 70–99)
Glucose-Capillary: 151 mg/dL — ABNORMAL HIGH (ref 70–99)
Glucose-Capillary: 129 mg/dL — ABNORMAL HIGH (ref 70–99)
Glucose-Capillary: 127 mg/dL — ABNORMAL HIGH (ref 70–99)
Glucose-Capillary: 129 mg/dL — ABNORMAL HIGH (ref 70–99)

## 2013-08-18 LAB — PHOSPHORUS: Phosphorus: 2.7 mg/dL (ref 2.3–4.6)

## 2013-08-18 LAB — MAGNESIUM: Magnesium: 2.2 mg/dL (ref 1.5–2.5)

## 2013-08-18 MED ORDER — M.V.I. ADULT IV INJ
INTRAVENOUS | Status: AC
  Administered 2013-08-18: 18:00:00 via INTRAVENOUS
  Filled 2013-08-18: qty 2000

## 2013-08-18 MED ORDER — SODIUM CHLORIDE 0.9 % IV BOLUS (SEPSIS)
500.0000 mL | Freq: Once | INTRAVENOUS | Status: AC
  Administered 2013-08-18: 500 mL via INTRAVENOUS

## 2013-08-18 NOTE — Progress Notes (Addendum)
PARENTERAL NUTRITION CONSULT NOTE:  FOLLOW-UP  Pharmacy Consult:  TPN Indication: Suspect TF intolerance d/t to severe bowel edema  Allergies  Allergen Reactions  . Cephalexin Other (See Comments)    Unknown  . Codeine Nausea And Vomiting    Patient Measurements: Height: _0  (154.9 cm) Weight: 147 lb 7.8 oz (66.9 kg) IBW/kg (Calculated) : 47.8  Usual Weight: 55 kg (this was admit weight at [facility] 2/13)  Vital Signs: Temp: 98 F (36.7 C) (03/15 0739) Temp src: Oral (03/15 0739) BP: 115/49 mmHg (03/15 0700) Pulse Rate: 85 (03/15 0700) Intake/Output from previous day: 03/14 0701 - 03/15 0700 In: 7233.4 [I.V.:2640.9; Blood:12.5; NG/GT:90; IV ZOXWRUEAV:4098; JXB:1478] Out: 2956 [Emesis/NG output:250; Drains:105]  Labs:  Recent Labs  08/16/13 0442 08/17/13 0239 08/18/13 0510  WBC 27.8* 19.2* 16.4*  HGB 7.1* 6.5* 8.3*  HCT 23.0* 20.3* 25.7*  PLT 66* 53* 48*     Recent Labs  08/15/13 1600 08/16/13 0442 08/16/13 1600 08/17/13 0239 08/18/13 0510  NA 130* 130* 129* 128* 126*  K 4.3 4.7 4.6 4.6 4.9  CL 97 95* 95* 94* 93*  CO2 _1 GLUCOSE 199* 196* 159* 200* 151*  BUN 31* 30* 33* 31* 32*  CREATININE 0.36* 0.38* 0.41* 0.39* 0.35*  CALCIUM 7.7* 8.0* 8.0* 7.9* 8.1*  MG  --  2.4  --  2.4 2.2  PHOS 2.4 2.7 3.1 2.8 2.7  PROT  --  4.8*  --   --   --   ALBUMIN 1.3* 1.4* 1.3*  --   --   AST  --  164*  --   --   --   ALT  --  75*  --   --   --   ALKPHOS  --  199*  --   --   --   BILITOT  --  4.8*  --   --   --    Estimated Creatinine Clearance: 56.4 ml/min (by C-G formula based on Cr of 0.35).    Recent Labs  08/17/13 2358 08/18/13 0350 08/18/13 0716  GLUCAP 152* 151* 129*     Insulin Requirements in the past 24 hours:  21 units moderate SSI + 80 units regular insulin in TPN with CBGs 129-184  Assessment: 61 YOF admitted 07/19/13 to [facility] for planned repair of enterocolonic fistula. Post-op course complicated by ileus, renal failure, and  cardiopulmonary arrest.  Patient transferred to [facility] on 08/26/2013. +CVVHD.  GI: bowels are too edematous to tolerate TF and may trickle feed when volume status improves per Surgery. Baseline prealbumin was 15.6 now 12.3. TG 138. NG O/P 250cc, CT: Enteric fistula increased in size. Drains 105cc. Abdomen with open wound and bilious drainage from fistula.  Endo: no hx DM - requiring significant amounts of insulin. CBGs have all been around or <150 since new bag last PM.  Lytes: Low Na 126, Cl low 93 and Phos 2.7 today. Ca 8.0 adjusts to 10.08.  Renal: hx CKD started on CRRT since 08/08/13 for volume regulation - SCr/BUN improving, up 60-70lbs since admit with diffuse anasarca (55kg at [place], initial weight at [facility] was 86kg, now at 66.9kg).  NS at 10 ml/hr. Anuric. TPN providing 2 g/kg/day of protein while on CRRT.  TPN also provides ~2L fluid per day. Renal aware.  Pulm: [facility] - FiO2 30% (stable). Now trached 3/11 remains on vent support.  Cards: AoC dHF (EF 35-40%), s/p cardiac arrest at [facility] 2/22.  Levophed and vasopressin for  worsening shock. Noted troponins elevated overnight.  Hepatobil: AST 899>>164, ALT 86>>75, Tbili increased to 4.8, alk phos 199  Neuro: less responsive  Heme: Transfuse for Hgb 6.5>>8.3 today. Plts only 48.  ID: Primaxin/Micafungin for intra-abd infxn + Enterococcus UTI, WBC back down to 16.4. Noted PC 4.91. Temp down to 96.6.  Best Practices: PPI IV, MC, SCDs on patient.  TPN Access: CVC placed 08/06/2013  TPN day#: PTA from [place] since 2/27   Current Nutrition:  - TPN at 60m/hr and Lipids 158mhr on Mon/Fri (decrease to 2d per week to prevent essential FA def) provides an average of 1551 kCal and 100gm protein daily.  Nutritional Goals:  1393 kCal (goal for permissive underfeeding ~1200 kCal); protein 115 gm per day- RD note 3/11   Plan:  - Continue Clinimix E 5/15 (ADDed electrolytes) at 83 ml/hr + lipids at 10 ml/hr on Mon/Fri only to  minimize kCal provision. -- Daily IV multivitamin in TPN and trace elements M/W/F with national shortage - Increase insulin in TPN    Waynette Towers S. RoAlford HighlandPharmD, BCHarbour Heightslinical Staff Pharmacist Pager [phone]/15/2015, 8:21 AM

## 2013-08-18 NOTE — Progress Notes (Signed)
  Subjective: Remains on vent CVVHD No acute changes  Objective: Vital signs in last 24 hours: Temp:  [96.6 F (35.9 C)-98.6 F (37 C)] 98 F (36.7 C) (03/15 0739) Pulse Rate:  [64-116] 85 (03/15 0700) Resp:  [19-41] 27 (03/15 0700) BP: (95-120)/(39-65) 115/49 mmHg (03/15 0700) SpO2:  [90 %-100 %] 97 % (03/15 0700) Arterial Line BP: (80-122)/(40-71) 114/53 mmHg (03/15 0700) FiO2 (%):  [0.3 %-30 %] 30 % (03/15 0700) Weight:  [147 lb 7.8 oz (66.9 kg)] 147 lb 7.8 oz (66.9 kg) (03/15 0400) Last BM Date: 08/14/13  Intake/Output from previous day: 03/14 0701 - 03/15 0700 In: 7233.4 [I.V.:2640.9; Blood:12.5; NG/GT:90; IV Piggyback:2398; TPN:2092] Out: 5060 [Emesis/NG output:250; Drains:105] Intake/Output this shift: Total I/O In: -  Out: 148 [Other:148]  Fistula with some bilious output Ostomy with minimal stool Lab Results:   Recent Labs  08/17/13 0239 08/18/13 0510  WBC 19.2* 16.4*  HGB 6.5* 8.3*  HCT 20.3* 25.7*  PLT 53* 48*   BMET  Recent Labs  08/17/13 0239 08/18/13 0510  NA 128* 126*  K 4.6 4.9  CL 94* 93*  CO2 20 21  GLUCOSE 200* 151*  BUN 31* 32*  CREATININE 0.39* 0.35*  CALCIUM 7.9* 8.1*   PT/INR No results found for this basename: LABPROT, INR,  in the last 72 hours ABG No results found for this basename: PHART, PCO2, PO2, HCO3,  in the last 72 hours  Studies/Results: No results found.  Anti-infectives: Anti-infectives   Start     Dose/Rate Route Frequency Ordered Stop   08/15/13 1200  micafungin (MYCAMINE) 100 mg in sodium chloride 0.9 % 100 mL IVPB     100 mg 100 mL/hr over 1 Hours Intravenous Daily 08/15/13 1134     08/10/13 0600  imipenem-cilastatin (PRIMAXIN) 250 mg in sodium chloride 0.9 % 100 mL IVPB     250 mg 200 mL/hr over 30 Minutes Intravenous 3 times per day 08/09/13 0021     08/09/13 1000  imipenem-cilastatin (PRIMAXIN) 250 mg in sodium chloride 0.9 % 100 mL IVPB  Status:  Discontinued     250 mg 200 mL/hr over 30 Minutes  Intravenous Every 12 hours 08/09/13 0019 08/09/13 0021   08/08/13 1000  micafungin (MYCAMINE) 100 mg in sodium chloride 0.9 % 100 mL IVPB  Status:  Discontinued     100 mg 100 mL/hr over 1 Hours Intravenous Daily 08/08/13 0950 08/08/13 1001   08/29/2013 1800  vancomycin (VANCOCIN) IVPB 1000 mg/200 mL premix  Status:  Discontinued     1,000 mg 200 mL/hr over 60 Minutes Intravenous Every 24 hours 09/03/2013 1625 08/11/13 1057   08/08/2013 1700  imipenem-cilastatin (PRIMAXIN) 250 mg in sodium chloride 0.9 % 100 mL IVPB  Status:  Discontinued     250 mg 200 mL/hr over 30 Minutes Intravenous 3 times per day 08/28/2013 1624 08/09/13 0019      Assessment/Plan: s[facility] complicated by SB fistula, now with sepsis and enterocutaneous fistula Continue present management;   LOS: 11 days    Janaiya Beauchesne K. 08/18/2013

## 2013-08-18 NOTE — Progress Notes (Signed)
CVVHD management.  Getting mildly hyponatremic, rest of labs OK. Only on 2 pressors, Neo weaned off.  CVP 8.  Cont to keep even for now due to hypotension.  Follow SNa.

## 2013-08-18 NOTE — Progress Notes (Addendum)
PULMONARY / CRITICAL CARE MEDICINE   Name: GAYLYN BERISH MRN: [id number] DOB: Sep 06, 1940  LOS 11 days    ADMISSION DATE:  09/03/2013  REFERRING MD :  [facility]  PRIMARY SERVICE: PCCM  CHIEF COMPLAINT:  Respiratory Failure   BRIEF PATIENT DESCRIPTION: 73 y/o admitted to [facility] s/p Hartman's procedure at [facility] complicated by SB fistula, now with sepsis and enterocutaneous fistula. .  Course was complicated by ileus, renal failure, cardiopulmonary arrest.  Transferred to [facility] on 3/4 for further care.   LINES / TUBES: R IJ TLC 2/14 >> 3/04 ETT(Trigg)  2/23 >> 08/14/13, 3/11 (trach by JY) >> R IJ HD cath 3/04 >>  L IJ TLC 3/04 >>  ? Date aline >.3/15  CULTURES: ?date [facility]) >>> ENTEROCOCCUS FAECALIS 3/4 MRSA PCR >>> neg 3/4 Urine >>>  Multiple bacterial morphotypes  3/5 Blood >>  neg     ANTIBIOTICS: Vancomycin 3/4 >> 3/08 Imipenem 3/4 >>  Mycafungin 3/12 >>  SIGNIFICANT EVENTS / STUDIES:  2/13  OR >>> Ex-lap with small bowel resection, extensive lysis of adhesions, vomiting 2/22  Cardiac arrest - VDRF/Intubated 08/12/2013 - ADMIT [facility] 3/05  TTE: EF 35-40, grade 2 diastolic dysfunction  3/05  CVVHD started 3/8 - No new issues. Tolerates PS 10 cm H2O 08/12/13: 40# off [facility] with CR[facility]. Still anuric. Patient is DNAR but full medical care. On levophed .. Family at bedside 08/13/13: No sedation but on levophed . RN says occ opens eyes but unresponsive to this MD (last fentanyl was 1aM)  08/14/13: For Trach today. WC upa at 40; worsening LLQ enteric fistula iwtih collection on CT; high risk for re-exploration per CCS. Still on levophed at . On CRRT. Is overbreathing vent; opens eyes   08/15/13: s/p trach now. Worsening shock; levophed and neo. LEss responsive. CCS thinks only option for fistula is drainage through ostomy bag.  WC high  08/16/13: On 3 pressors. Severe septic shock. . Still only  minimally responsive. Still anuric.   08/17/13: Still on 3 pressors. Following commands    SUBJECTIVE/OVERNIGHT/INTERVAL HX  08/18/13: neo off after 2L bolus. Now on 2 pressors.  Follows commands perRN. Stress steroids started  VITAL SIGNS: Temp:  [97.1 F (36.2 C)-98.6 F (37 C)] 98.3 F (36.8 C) (03/15 1137) Pulse Rate:  [64-116] 99 (03/15 1200) Resp:  [19-41] 28 (03/15 1200) BP: (95-120)/(39-69) 117/55 mmHg (03/15 1200) SpO2:  [90 %-100 %] 98 % (03/15 1200) Arterial Line BP: (80-127)/(40-71) 127/59 mmHg (03/15 1200) FiO2 (%):  [0.3 %-30 %] 30 % (03/15 1221) Weight:  [66.9 kg (147 lb 7.8 oz)] 66.9 kg (147 lb 7.8 oz) (03/15 0400)  HEMODYNAMICS: CVP:  [14 mmHg-15 mmHg] 14 mmHg VENTILATOR SETTINGS: Vent Mode:  [-] CPAP;PSV FiO2 (%):  [0.3 %-30 %] 30 % Set Rate:  [16 bmp] 16 bmp Vt Set:  [380 mL] 380 mL PEEP:  [5 cmH20] 5 cmH20 Pressure Support:  [14 cmH20] 14 cmH20 Plateau Pressure:  [20 cmH20-22 cmH20] 20 cmH20  INTAKE / OUTPUT: Intake/Output     03/14 0701 - 03/15 0700 03/15 0701 - 03/16 0700   I.V. (mL/kg) 2640.9 (39.5) 246.5 (3.7)   Blood 12.5    NG/GT 90    IV Piggyback 2398 100   TPN 2092 415   Total Intake(mL/kg) 7233.4 (108.1) 761.5 (11.4)   Emesis/NG output 250    Drains 105    Other 4942 549   Total Output 5297 549  Net +1936.4 +212.5         PHYSICAL EXAMINATION: General:  Very deconditioned. Critically ill looking Neuro: Diffusely weak, no focal deficits, RASS -3 on no sedation; worse, blinked eyes to voice command (RN says follows commands and wiggles toes) HEENT: WNL Cardiovascular:  RRR s M Lungs: clear anteriorly Abdomen:  BS absent, LLQ colostomy, RLQ fistula draining serous fluid Ext:  2+ symmetric UE and LE edema   PULMONARY  Recent Labs Lab 08/11/13 1827 08/12/13 0325 08/16/13 1113  PHART 7.233* 7.392  --   PCO2ART 53.9* 36.8  --   PO2ART 96.8 73.9*  --   HCO3 22.1 22.0  --   TCO2 23.8 23.2  --   O2SAT 97.0 95.6 61.9     CBC  Recent Labs Lab 08/16/13 0442 08/17/13 0239 08/18/13 0510  HGB 7.1* 6.5* 8.3*  HCT 23.0* 20.3* 25.7*  WBC 27.8* 19.2* 16.4*  PLT 66* 53* 48*    COAGULATION  Recent Labs Lab 08/14/13 1210  INR 1.16    CARDIAC    Recent Labs Lab 08/15/13 1118 08/16/13 0442 08/16/13 1200 08/16/13 1838 08/17/13 0238  TROPONINI <0.30 0.55* 0.62* 0.54* 0.79*   No results found for this basename: PROBNP,  in the last 168 hours   CHEMISTRY  Recent Labs Lab 08/14/13 0430  08/15/13 0440 08/15/13 1600 08/16/13 0442 08/16/13 1600 08/17/13 0239 08/18/13 0510  NA 130*  < > 132* 130* 130* 129* 128* 126*  K 4.6  < > 4.4 4.3 4.7 4.6 4.6 4.9  CL 97  < > 97 97 95* 95* 94* 93*  CO2 21  < > 21 21 22 22 20 21   GLUCOSE 177*  < > 192* 199* 196* 159* 200* 151*  BUN 36*  < > 36* 31* 30* 33* 31* 32*  CREATININE 0.41*  < > 0.37* 0.36* 0.38* 0.41* 0.39* 0.35*  CALCIUM 8.2*  < > 7.8* 7.7* 8.0* 8.0* 7.9* 8.1*  MG 2.3  --  2.3  --  2.4  --  2.4 2.2  PHOS 2.5  < > 2.4 2.4 2.7 3.1 2.8 2.7  < > = values in this interval not displayed. Estimated Creatinine Clearance: 56.4 ml/min (by C-G formula based on Cr of 0.35).   LIVER  Recent Labs Lab 08/12/13 0505  08/14/13 0430 08/14/13 1210 08/14/13 1519 08/15/13 0440 08/15/13 1600 08/16/13 0442 08/16/13 1600  AST 714*  --   --   --   --   --   --  164*  --   ALT 120*  --   --   --   --   --   --  75*  --   ALKPHOS 119*  --   --   --   --   --   --  199*  --   BILITOT 1.9*  --   --   --   --   --   --  4.8*  --   PROT 4.8*  --   --   --   --   --   --  4.8*  --   ALBUMIN 1.7*  < > 1.5*  --  1.6* 1.5* 1.3* 1.4* 1.3*  INR  --   --   --  1.16  --   --   --   --   --   < > = values in this interval not displayed.   INFECTIOUS  Recent Labs Lab 08/14/13 1207 08/15/13 0440 08/15/13 1117 08/16/13  4540  LATICACIDVEN  --   --  2.8*  --   PROCALCITON 4.38 4.91  --  7.95     ENDOCRINE CBG (last 3)   Recent Labs  08/18/13 0350  08/18/13 0716 08/18/13 1110  GLUCAP 151* 129* 120*         IMAGING x48h  Dg Chest Port 1 View  08/18/2013   CLINICAL DATA:  Intubated  EXAM: PORTABLE CHEST - 1 VIEW  COMPARISON:  DG CHEST 1V PORT dated 08/16/2013  FINDINGS: Tracheostomy remains appropriately positioned. Nasogastric tube tip terminates below the level of the hemidiaphragms but is not included in the field of view. Right IJ central line tip terminates over the distal SVC. Left IJ line tip terminates over the proximal SVC. Evidence of CABG noted. Mild cardiomegaly reidentified with slight increase in patchy bilateral lower lobe predominant airspace opacities. Trace pleural effusions are noted with interstitial Kerley B-lines.  IMPRESSION: Increased bibasilar airspace opacities likely reflecting pulmonary edema although worsening pneumonia or other airspace disease could appear similar.   Electronically Signed   By: Christiana Pellant M.D.   On: 08/18/2013 08:55      ASSESSMENT / PLAN:  PULMONARY A: Acute and chronic respiratory failure with prolonged mech vent/chronic critical illness  - s/p trach 08/14/13 COPD without exacerbation LLL PNA on CT abd 08/13/13   - Failure to wean due to severe physical deconditioning, septic shock and poor mental status P:   Full vent supprt   CARDIOVASCULAR A:  Septic shock Cardiogenic shock S/p Cardiopulmonary arrest Acute on chronic systolic and diastolic CHF NSTEMI 2/23 [facility])   -severe shock, likely septic. CRRT running even.   - No initial improvement initially  after prbc and adding mycafungin but currently improved after fluid bolus  P:  Fluid bolus x again 500c Try hydrocort If fails, try adding dobutamine (low scvo2) Wean NE to off for MAP > 55 and sbp > 90 (new goal)   RENAL A:   AKI, anuric Hyperkalemia, resolved Anasasrca   - still anuric. On CVVH, running even  P:   CRRT per Nephrology    GASTROINTESTINAL A:   Enterocolonic fistula  Protein  calorie malnutrition   - CT shows worsening LLQ fistula; can explain high WBC  P:   SUP: IV PPI Cont TPN Surgery following  HEMATOLOGIC A:   Anemia s/p PRBC 08/17/13 Mild thrombocytopenia: Low Prob 4T score (onset > 10 days) P:  DVT px: SCDs PRBC for hgb < 7gm%  INFECTIOUS A:   Severe sepsis with septic shock persists in setting of complicated GI surgery UTI P:   Cx / abx as above mycafungin for now Stop dates need to be established   ENDOCRINE A:   Hyperglycemia P:   Cont SSI   NEUROLOGIC A:   Acute encephalopathy.  CT head negative for acute abnormalities   - follows some  commands with RASS -2/-3 despite not getting opioids  P:   Goal RASS 0 to -1 Cont PRN fentanyl   GLOBAL  08/14/13: detailed goals of care with husband his sister: he is emotionally torn. He understands very poor prognosis but does not want to give up yet. Agreed to tracheostomy with time limited trial of few to several week; if no recovery then palliate  08/18/13: Updated husband at bedside. Re-emphasized time limited trial for few to several weeks from date of trach.    I have personally obtained history, examined patient, evaluated and interpreted laboratory and imaging results, reviewed medical records,  formulated assessment / plan and placed orders.  CRITICAL CARE:  The patient is critically ill with multiple organ systems failure and requires high complexity decision making for assessment and support, frequent evaluation and titration of therapies, application of advanced monitoring technologies and extensive interpretation of multiple databases. Critical Care Time devoted to patient care services described in this note is .   Dr. Kalman Shan, M.D., [facility].C.P Pulmonary and Critical Care Medicine Staff Physician [facility] [place] [facility] Critical [facility] Pager: [phone], If no answer or between  15:00h - 7:00h: call [phone]  08/18/2013 12:29  PM

## 2013-08-18 NOTE — Progress Notes (Signed)
ANTIBIOTIC CONSULT NOTE - FOLLOW UP  Pharmacy Consult for Imipenem Indication: intraabdominal infection  Allergies  Allergen Reactions  . Cephalexin Other (See Comments)    Unknown  . Codeine Nausea And Vomiting    Patient Measurements: Height: 5\' 1"  (154.9 cm) Weight: 147 lb 7.8 oz (66.9 kg) IBW/kg (Calculated) : 47.8  Vital Signs: Temp: 98.3 F (36.8 C) (03/15 1137) Temp src: Oral (03/15 1137) BP: 117/55 mmHg (03/15 1200) Pulse Rate: 99 (03/15 1200) Intake/Output from previous day: 03/14 0701 - 03/15 0700 In: 7233.4 [I.V.:2640.9; Blood:12.5; NG/GT:90; IV Piggyback:2398; TPN:2092] Out: 5297 [Emesis/NG output:250; Drains:105] Intake/Output from this shift: Total I/O In: 555 [I.V.:40; IV Piggyback:100; TPN:415] Out: 549 [Other:549]  Labs:  Recent Labs  08/16/13 0442 08/16/13 1600 08/17/13 0239 08/18/13 0510  WBC 27.8*  --  19.2* 16.4*  HGB 7.1*  --  6.5* 8.3*  PLT 66*  --  53* 48*  CREATININE 0.38* 0.41* 0.39* 0.35*   Estimated Creatinine Clearance: 56.4 ml/min (by C-G formula based on Cr of 0.35). No results found for this basename: VANCOTROUGH, Leodis Binet, VANCORANDOM, GENTTROUGH, GENTPEAK, GENTRANDOM, TOBRATROUGH, TOBRAPEAK, TOBRARND, AMIKACINPEAK, AMIKACINTROU, AMIKACIN,  in the last 72 hours   Microbiology: Recent Results (from the past 720 hour(s))  MRSA PCR SCREENING     Status: None   Collection Time    08/20/2013 11:24 AM      Result Value Ref Range Status   MRSA by PCR NEGATIVE  NEGATIVE Final   Comment:            The GeneXpert MRSA Assay (FDA     approved for NASAL specimens     only), is one component of a     comprehensive MRSA colonization     surveillance program. It is not     intended to diagnose MRSA     infection nor to guide or     monitor treatment for     MRSA infections.  URINE CULTURE     Status: None   Collection Time    08/19/2013 11:26 AM      Result Value Ref Range Status   Specimen Description URINE, CATHETERIZED   Final   Special Requests Normal   Final   Culture  Setup Time     Final   Value: 09/03/2013 12:02     Performed at [facility] Count     Final   Value: >=100,000 COLONIES/ML     Performed at [facility]   Culture     Final   Value: Multiple bacterial morphotypes present, none predominant. Suggest appropriate recollection if clinically indicated.     Performed at [facility]   Report Status 08/08/2013 FINAL   Final  CULTURE, BLOOD (ROUTINE X 2)     Status: None   Collection Time    08/30/2013  4:20 PM      Result Value Ref Range Status   Specimen Description BLOOD   Final   Special Requests     Final   Value: BOTTLES DRAWN AEROBIC AND ANAEROBIC 10CC LEFT IJ CVC   Culture  Setup Time     Final   Value: 08/06/2013 20:59     Performed at [facility]   Culture     Final   Value: NO GROWTH 5 DAYS     Performed at [facility]   Report Status 08/13/2013 FINAL   Final    Anti-infectives   Start     Dose/Rate  Route Frequency Ordered Stop   08/15/13 1200  micafungin (MYCAMINE) 100 mg in sodium chloride 0.9 % 100 mL IVPB     100 mg 100 mL/hr over 1 Hours Intravenous Daily 08/15/13 1134     08/10/13 0600  imipenem-cilastatin (PRIMAXIN) 250 mg in sodium chloride 0.9 % 100 mL IVPB     250 mg 200 mL/hr over 30 Minutes Intravenous 3 times per day 08/09/13 0021     08/09/13 1000  imipenem-cilastatin (PRIMAXIN) 250 mg in sodium chloride 0.9 % 100 mL IVPB  Status:  Discontinued     250 mg 200 mL/hr over 30 Minutes Intravenous Every 12 hours 08/09/13 0019 08/09/13 0021   08/08/13 1000  micafungin (MYCAMINE) 100 mg in sodium chloride 0.9 % 100 mL IVPB  Status:  Discontinued     100 mg 100 mL/hr over 1 Hours Intravenous Daily 08/08/13 0950 08/08/13 1001   08/28/2013 1800  vancomycin (VANCOCIN) IVPB 1000 mg/200 mL premix  Status:  Discontinued     1,000 mg 200 mL/hr over 60 Minutes Intravenous Every 24 hours 08/22/2013 1625 08/11/13 1057   09/02/2013 1700   imipenem-cilastatin (PRIMAXIN) 250 mg in sodium chloride 0.9 % 100 mL IVPB  Status:  Discontinued     250 mg 200 mL/hr over 30 Minutes Intravenous 3 times per day 08/22/2013 1624 08/09/13 0019      Assessment: 73 year old female who continues on Primaxin (Day #12) for intraabdominal infection. Pt also on Micafungin Day #4 for empiric fungal coverage. Finished Vanc for enterococcus UTI. Hypothermic. WBC 16.4- trending down. Afeb. 3/13 PCT 7.95. Her antibiotic dose is appropriate for CRRT.  She is tolerating CRRT at this time.  Goal of Therapy:  Treatment of infection  Plan:  1) Continue Imipenem 250 mg IV q8h. ?length of therapy 2) F/u CRRT tolerance and plans for transition off. 3) F/u family discussions   Christoper Fabianaron Jarrah Babich, PharmD, BCPS Clinical pharmacist, pager [phone] 08/18/2013, 12:29 PM

## 2013-08-19 ENCOUNTER — Inpatient Hospital Stay (HOSPITAL_COMMUNITY): Payer: Medicare Other

## 2013-08-19 LAB — RENAL FUNCTION PANEL
Albumin: 1.5 g/dL — ABNORMAL LOW (ref 3.5–5.2)
BUN: 40 mg/dL — ABNORMAL HIGH (ref 6–23)
CHLORIDE: 95 meq/L — AB (ref 96–112)
CO2: 25 meq/L (ref 19–32)
CREATININE: 0.43 mg/dL — AB (ref 0.50–1.10)
Calcium: 8.6 mg/dL (ref 8.4–10.5)
Glucose, Bld: 99 mg/dL (ref 70–99)
Phosphorus: 4.7 mg/dL — ABNORMAL HIGH (ref 2.3–4.6)
Potassium: 5.2 mEq/L (ref 3.7–5.3)
SODIUM: 130 meq/L — AB (ref 137–147)

## 2013-08-19 LAB — DIFFERENTIAL
BASOS PCT: 0 % (ref 0–1)
Basophils Absolute: 0 10*3/uL (ref 0.0–0.1)
EOS PCT: 0 % (ref 0–5)
Eosinophils Absolute: 0 10*3/uL (ref 0.0–0.7)
LYMPHS ABS: 0.6 10*3/uL — AB (ref 0.7–4.0)
Lymphocytes Relative: 4 % — ABNORMAL LOW (ref 12–46)
MONO ABS: 1.3 10*3/uL — AB (ref 0.1–1.0)
MONOS PCT: 8 % (ref 3–12)
NEUTROS ABS: 14.1 10*3/uL — AB (ref 1.7–7.7)
Neutrophils Relative %: 88 % — ABNORMAL HIGH (ref 43–77)

## 2013-08-19 LAB — COMPREHENSIVE METABOLIC PANEL
ALBUMIN: 1.5 g/dL — AB (ref 3.5–5.2)
ALK PHOS: 326 U/L — AB (ref 39–117)
ALT: 77 U/L — AB (ref 0–35)
AST: 258 U/L — ABNORMAL HIGH (ref 0–37)
BUN: 37 mg/dL — AB (ref 6–23)
CO2: 24 mEq/L (ref 19–32)
Calcium: 8.5 mg/dL (ref 8.4–10.5)
Chloride: 95 mEq/L — ABNORMAL LOW (ref 96–112)
Creatinine, Ser: 0.4 mg/dL — ABNORMAL LOW (ref 0.50–1.10)
GFR calc Af Amer: >90 mL/min (ref >90)
GFR calc non Af Amer: >90 mL/min (ref >90)
Glucose, Bld: 116 mg/dL — ABNORMAL HIGH (ref 70–99)
POTASSIUM: 5 meq/L (ref 3.7–5.3)
Sodium: 129 mEq/L — ABNORMAL LOW (ref 137–147)
TOTAL PROTEIN: 5.5 g/dL — AB (ref 6.0–8.3)
Total Bilirubin: 7.9 mg/dL — ABNORMAL HIGH (ref 0.3–1.2)

## 2013-08-19 LAB — GLUCOSE, CAPILLARY
GLUCOSE-CAPILLARY: 139 mg/dL — AB (ref 70–99)
GLUCOSE-CAPILLARY: 143 mg/dL — AB (ref 70–99)
Glucose-Capillary: 115 mg/dL — ABNORMAL HIGH (ref 70–99)
Glucose-Capillary: 119 mg/dL — ABNORMAL HIGH (ref 70–99)
Glucose-Capillary: 121 mg/dL — ABNORMAL HIGH (ref 70–99)
Glucose-Capillary: 96 mg/dL (ref 70–99)
Glucose-Capillary: 98 mg/dL (ref 70–99)

## 2013-08-19 LAB — CBC
HEMATOCRIT: 25.7 % — AB (ref 36.0–46.0)
HEMOGLOBIN: 8.2 g/dL — AB (ref 12.0–15.0)
MCH: 31.1 pg (ref 26.0–34.0)
MCHC: 31.9 g/dL (ref 30.0–36.0)
MCV: 97.3 fL (ref 78.0–100.0)
Platelets: 59 10*3/uL — ABNORMAL LOW (ref 150–400)
RBC: 2.64 MIL/uL — ABNORMAL LOW (ref 3.87–5.11)
RDW: 24.7 % — AB (ref 11.5–15.5)
WBC: 16 10*3/uL — ABNORMAL HIGH (ref 4.0–10.5)

## 2013-08-19 LAB — PHOSPHORUS: PHOSPHORUS: 3.6 mg/dL (ref 2.3–4.6)

## 2013-08-19 LAB — BILIRUBIN, FRACTIONATED(TOT/DIR/INDIR)
Bilirubin, Direct: 6.8 mg/dL — ABNORMAL HIGH (ref 0.0–0.3)
Indirect Bilirubin: 1.1 mg/dL — ABNORMAL HIGH (ref 0.3–0.9)
Total Bilirubin: 7.9 mg/dL — ABNORMAL HIGH (ref 0.3–1.2)

## 2013-08-19 LAB — MAGNESIUM: Magnesium: 2.5 mg/dL (ref 1.5–2.5)

## 2013-08-19 LAB — PREALBUMIN: Prealbumin: 9.6 mg/dL — ABNORMAL LOW (ref 17.0–34.0)

## 2013-08-19 LAB — TRIGLYCERIDES: TRIGLYCERIDES: 139 mg/dL (ref <150)

## 2013-08-19 MED ORDER — FAT EMULSION 20 % IV EMUL
240.0000 mL | INTRAVENOUS | Status: AC
  Administered 2013-08-19: 240 mL via INTRAVENOUS
  Filled 2013-08-19: qty 250

## 2013-08-19 MED ORDER — LINEZOLID 2 MG/ML IV SOLN
600.0000 mg | Freq: Two times a day (BID) | INTRAVENOUS | Status: DC
  Administered 2013-08-19 – 2013-08-23 (×9): 600 mg via INTRAVENOUS
  Filled 2013-08-19 (×10): qty 300

## 2013-08-19 MED ORDER — FAT EMULSION 20 % IV EMUL
240.0000 mL | INTRAVENOUS | Status: DC
  Filled 2013-08-19: qty 250

## 2013-08-19 MED ORDER — ZINC TRACE METAL 1 MG/ML IV SOLN
INTRAVENOUS | Status: DC
  Filled 2013-08-19: qty 2000

## 2013-08-19 MED ORDER — ZINC TRACE METAL 1 MG/ML IV SOLN
INTRAVENOUS | Status: AC
  Administered 2013-08-19: 18:00:00 via INTRAVENOUS
  Filled 2013-08-19: qty 2000

## 2013-08-19 NOTE — Consult Note (Signed)
WOC ostomy follow up Stoma type/location: Colostomy to left lower quad,  Stomal assessment/size: stoma red and viable, 1 1/4 inches, slightly above skin level Peristomal assessment: intact skin surrounding Output 50cc liquid brown stool Ostomy pouching: 1pc.  Education provided: Husband at bedside states he is familiar with pouching routines and took care of pouch application and emptying prior to this hospital stay. He is independent and denies any further questions or problems with colostomy care.  Abd with full thickness dehisced wound.  CCS team at bedside to assess site and remove some staples.  Other staples remain intact above and below open area with fistula draining large amt dark brownish-green liquid.  Applied medium Eakin pouch to gravity drainage bag.  Extra supplies at bedside for staff use PRN if leaking. Cammie Mcgeeawn Karver Fadden MSN, RN, CWOCN, FrombergWCN-AP, CNS [phone]

## 2013-08-19 NOTE — Progress Notes (Signed)
Patient ID: Leah FurlongSandra K [name], female   DOB: 05/06/1941, 73 y.o.   MRN: [id number] Patient seen on CRRT. Remains on vaspressin and levophed  CRRT prescription: CVVDHF Pre-filter BGK4/2.5 26300mL/hr Post filter BGK4/2.5 22200mL/hr Dialysate BGK4/2.5 105300mL/hr UF 0    Assessment:  1 Anuric AKI: From ischemic ATN-- continue CRRT at this time (keeping even due to pressor needs) 2 Volume overload: Improved with recent ultrafiltration  3 Hyponatremia: Due to limitations with UF and ongoing (hypotonic) TPN 4 Hyperkalemia: Mild, no changes to CRRT prescription. May need to DC potassium in TPN.  Cont CVVHDF for highly catabolic patient in anuric ARF

## 2013-08-19 NOTE — Progress Notes (Signed)
PULMONARY / CRITICAL CARE MEDICINE   Name: Leah [name] MRN: [id number] DOB: 07/03/1940  LOS 12 days    ADMISSION DATE:  2013-08-17  REFERRING MD :  [facility]  PRIMARY SERVICE: PCCM  CHIEF COMPLAINT:  Respiratory Failure   BRIEF PATIENT DESCRIPTION: 73 y/o admitted to [facility] s/p Hartman's procedure at [facility] complicated by SB fistula, now with sepsis and enterocutaneous fistula. .  Course was complicated by ileus, renal failure, cardiopulmonary arrest.  Transferred to [facility] on 3/4 for further care.   LINES / TUBES: R IJ TLC 2/14 >> 3/04 ETT([place])  2/23 >> 08/14/13, 3/11 ([facility]) >> R IJ HD cath 3/04 >>  L IJ TLC 3/04 >>  ? Date aline >.3/15  CULTURES: ?date [facility]) >>> ENTEROCOCCUS FAECALIS 3/4 MRSA PCR >>> neg 3/4 Urine >>>  Multiple bacterial morphotypes  3/5 Blood >>  Neg 3/16 BC>>>  ANTIBIOTICS: Vancomycin 3/4 >> 3/08 Imipenem 3/4 >>> Mycafungin 3/12 >>> linazolid 3/16 (continued high pressors, high risk VRE, plat count noted)>>>  SIGNIFICANT EVENTS / STUDIES:  2/13  OR >>> Ex-lap with small bowel resection, extensive lysis of adhesions, vomiting 2/22  Cardiac arrest - VDRF/Intubated 17-Aug-2013 - ADMIT CONE 3/05  TTE: EF 35-40, grade 2 diastolic dysfunction  3/05  CVVHD started 3/8 - No new issues. Tolerates PS 10 cm H2O 08/12/13: 40# off since [facility] with CR[facility]. Still anuric. Patient is DNAR but full medical care. On levophed .. Family at bedside 08/13/13: No sedation but on levophed . RN says occ opens eyes but unresponsive to this MD (last fentanyl was 1aM) 08/14/13: For Trach today. WC upa at 40; worsening LLQ enteric fistula iwtih collection on CT; high risk for re-exploration per CCS. Still on levophed at . On CRRT. Is overbreathing vent; opens eyes 3/16- remains with high pressor needs   SUBJECTIVE/OVERNIGHT/INTERVAL HX Remains on high dose pressors  VITAL SIGNS: Temp:  [97.7 F (36.5 C)-98.8 F  (37.1 C)] 97.7 F (36.5 C) (03/16 0400) Pulse Rate:  [62-112] 96 (03/16 0800) Resp:  [16-35] 28 (03/16 0800) BP: (93-119)/(38-84) 115/63 mmHg (03/16 0800) SpO2:  [95 %-100 %] 100 % (03/16 0800) Arterial Line BP: (106-127)/(47-59) 110/48 mmHg (03/15 1600) FiO2 (%):  [30 %] 30 % (03/16 0800) Weight:  [66.9 kg (147 lb 7.8 oz)] 66.9 kg (147 lb 7.8 oz) (03/16 0400)  HEMODYNAMICS: CVP:  [10 mmHg-13 mmHg] 10 mmHg VENTILATOR SETTINGS: Vent Mode:  [-] CPAP;PSV FiO2 (%):  [30 %] 30 % Set Rate:  [16 bmp] 16 bmp Vt Set:  [380 mL] 380 mL PEEP:  [5 cmH20] 5 cmH20 Pressure Support:  [14 cmH20] 14 cmH20 Plateau Pressure:  [20 cmH20-25 cmH20] 25 cmH20  INTAKE / OUTPUT: Intake/Output     03/15 0701 - 03/16 0700 03/16 0701 - 03/17 0700   I.V. (mL/kg) 1243.9 (18.6) 42.4 (0.6)   Blood     NG/GT 90    IV Piggyback 400    TPN 1992 83   Total Intake(mL/kg) 3725.9 (55.7) 125.4 (1.9)   Emesis/NG output 450    Drains 75    Other 3259 135   Total Output 3784 135   Net -58.1 -9.6         PHYSICAL EXAMINATION: General:  Very deconditioned. Critically ill looking Neuro: Diffusely weak, rass 1, follows commands HEENT: WNL Cardiovascular:  RRR s M Lungs: coarse Abdomen:  BS absent, LLQ colostomy, RLQ fistula draining serous fluid Ext:  Edema noted  PULMONARY  Recent Labs  Lab 08/16/13 1113  O2SAT 61.9    CBC  Recent Labs Lab 08/17/13 0239 08/18/13 0510 08/19/13 0516  HGB 6.5* 8.3* 8.2*  HCT 20.3* 25.7* 25.7*  WBC 19.2* 16.4* 16.0*  PLT 53* 48* 59*    COAGULATION  Recent Labs Lab 08/14/13 1210  INR 1.16    CARDIAC    Recent Labs Lab 08/15/13 1118 08/16/13 0442 08/16/13 1200 08/16/13 1838 08/17/13 0238  TROPONINI <0.30 0.55* 0.62* 0.54* 0.79*   No results found for this basename: PROBNP,  in the last 168 hours   CHEMISTRY  Recent Labs Lab 08/15/13 0440  08/16/13 0442 08/16/13 1600 08/17/13 0239 08/18/13 0510 08/18/13 1600 08/19/13 0516  NA 132*  < >  130* 129* 128* 126* 128* 129*  K 4.4  < > 4.7 4.6 4.6 4.9 4.9 5.0  CL 97  < > 95* 95* 94* 93* 95* 95*  CO2 21  < > 22 22 20 21 22 24   GLUCOSE 192*  < > 196* 159* 200* 151* 141* 116*  BUN 36*  < > 30* 33* 31* 32* 34* 37*  CREATININE 0.37*  < > 0.38* 0.41* 0.39* 0.35* 0.39* 0.40*  CALCIUM 7.8*  < > 8.0* 8.0* 7.9* 8.1* 8.2* 8.5  MG 2.3  --  2.4  --  2.4 2.2  --  2.5  PHOS 2.4  < > 2.7 3.1 2.8 2.7 3.4 3.6  < > = values in this interval not displayed. Estimated Creatinine Clearance: 56.4 ml/min (by C-G formula based on Cr of 0.4).   LIVER  Recent Labs Lab 08/14/13 0430 08/14/13 1210  08/15/13 1600 08/16/13 0442 08/16/13 1600 08/18/13 1600 08/19/13 0516  AST  --   --   --   --  164*  --   --  258*  ALT  --   --   --   --  75*  --   --  77*  ALKPHOS  --   --   --   --  199*  --   --  326*  BILITOT  --   --   --   --  4.8*  --   --  7.9*  PROT  --   --   --   --  4.8*  --   --  5.5*  ALBUMIN 1.5*  --   < > 1.3* 1.4* 1.3* 1.3* 1.5*  INR  --  1.16  --   --   --   --   --   --   < > = values in this interval not displayed.   INFECTIOUS  Recent Labs Lab 08/14/13 1207 08/15/13 0440 08/15/13 1117 08/16/13 0442  LATICACIDVEN  --   --  2.8*  --   PROCALCITON 4.38 4.91  --  7.95     ENDOCRINE CBG (last 3)   Recent Labs  08/18/13 1916 08/18/13 2353 08/19/13 0353  GLUCAP 129* 115* 121*    IMAGING x48h  Dg Chest Port 1 View  08/19/2013   CLINICAL DATA:  Check endotracheal tube, shortness of breath  EXAM: PORTABLE CHEST - 1 VIEW  COMPARISON:  08/18/2013  FINDINGS: Cardiac shadow is stable. Diffuse bilateral infiltrates are again identified. Bilateral septal lines are again identified and stable. A nasogastric catheter is noted within the stomach. The tracheostomy tube is seen in satisfactory position.  IMPRESSION: Bilateral infiltrates stable from the prior exam. No new focal abnormality is seen.   Electronically Signed   By: Alcide Clever  M.D.   On: 08/19/2013 07:11   Dg  Chest Port 1 View  08/18/2013   CLINICAL DATA:  Intubated  EXAM: PORTABLE CHEST - 1 VIEW  COMPARISON:  DG CHEST 1V PORT dated 08/16/2013  FINDINGS: Tracheostomy remains appropriately positioned. Nasogastric tube tip terminates below the level of the hemidiaphragms but is not included in the field of view. Right IJ central line tip terminates over the distal SVC. Left IJ line tip terminates over the proximal SVC. Evidence of CABG noted. Mild cardiomegaly reidentified with slight increase in patchy bilateral lower lobe predominant airspace opacities. Trace pleural effusions are noted with interstitial Kerley B-lines.  IMPRESSION: Increased bibasilar airspace opacities likely reflecting pulmonary edema although worsening pneumonia or other airspace disease could appear similar.   Electronically Signed   By: Christiana PellantGretchen  Green M.D.   On: 08/18/2013 08:55    ASSESSMENT / PLAN:  PULMONARY A: Acute and chronic respiratory failure with prolonged mech vent/chronic critical illness  - s/p trach 08/14/13 COPD without exacerbation LLL PNA on CT abd 08/13/13  - Failure to wean due to severe physical deconditioning, septic shock and poor mental status P:   Remains on low O2 needs Keep same MV when on rest Repeat abg on rest in am  Wean PS 14 to goal 10 if able, goal 4-6 hrs pcxr in am  Even to neg balance  CARDIOVASCULAR A:  Septic shock Cardiogenic shock S/p Cardiopulmonary arrest Acute on chronic systolic and diastolic CHF NSTEMI 2/23 [facility]([place])  P:  Levophed to MAP goal 60 maintain vaso for now Repeat cortisol but consider burn out and such borderline response to maintain stress roids  RENAL A:   ARF, hyponatremia  P:   CRRT per Nephrology Consider  Continued even balance Chem in am   GASTROINTESTINAL A:  S/p HArtmans procedure Enterocolonic fistula  Protein calorie malnutrition cholestasis  P:   SUP: IV PPI Cont TPN Surgery following 25 levophed, can we consider repeat CT for  other collection, new? lft in am  Fractionate bili  HEMATOLOGIC A:   Anemia s/p PRBC 08/17/13 Mild thrombocytopenia, extensive hep exposure P:  DVT px: SCDs Ensure HIT sent, d/w renal to dc heparin Risk linazolid not out weight use  INFECTIOUS A:   Septic shock persists in setting of complicated GI surgery UTI P:   Continued high pressor needs consider CT for new collection Add linazolid, high risk VRE Repeat BC UA Continue imi, caspo  ENDOCRINE A:   Hyperglycemia Likely rel AI P:   Cont SSI  Continue steroids  NEUROLOGIC A:   Acute encephalopathy.  CT head negative for acute abnormalities  P:   Goal RASS 0 to -1 Cont PRN fentanyl Avoid cont sedation  Ccm time 30 min   Mcarthur Rossettianiel J. Tyson AliasFeinstein, MD, FACP Pgr: [phone] [facility]

## 2013-08-19 NOTE — Progress Notes (Signed)
UR Completed.  Chrishawn Boley Jane [phone] 08/19/2013  

## 2013-08-19 NOTE — Progress Notes (Signed)
Placed patient back on full support for QHS rest.  Patient tolerating well at this time. RT will continue to monitor.

## 2013-08-19 NOTE — Progress Notes (Addendum)
PARENTERAL NUTRITION CONSULT NOTE:  FOLLOW-UP  Pharmacy Consult:  TPN Indication: Suspect TF intolerance d/t to severe bowel edema  Allergies  Allergen Reactions  . Cephalexin Other (See Comments)    Unknown  . Codeine Nausea And Vomiting    Patient Measurements: Height: 5\' 1"  (154.9 cm) Weight: 147 lb 7.8 oz (66.9 kg) IBW/kg (Calculated) : 47.8  Usual Weight: 55 kg (this was admit weight at [facility] 2/13)  Vital Signs: Temp: 97.7 F (36.5 C) (03/16 0400) Temp src: Oral (03/16 0400) BP: 115/63 mmHg (03/16 0800) Pulse Rate: 96 (03/16 0800) Intake/Output from previous day: 03/15 0701 - 03/16 0700 In: 3725.9 [I.V.:1243.9; NG/GT:90; IV Piggyback:400; CBS:4967] Out: 3784 [Emesis/NG output:450; Drains:75]  Labs:  Recent Labs  08/17/13 0239 08/18/13 0510 08/19/13 0516  WBC 19.2* 16.4* 16.0*  HGB 6.5* 8.3* 8.2*  HCT 20.3* 25.7* 25.7*  PLT 53* 48* 59*     Recent Labs  08/16/13 1600 08/17/13 0239 08/18/13 0510 08/18/13 1600 08/19/13 0516  NA 129* 128* 126* 128* 129*  K 4.6 4.6 4.9 4.9 5.0  CL 95* 94* 93* 95* 95*  CO2 22 20 21 22 24   GLUCOSE 159* 200* 151* 141* 116*  BUN 33* 31* 32* 34* 37*  CREATININE 0.41* 0.39* 0.35* 0.39* 0.40*  CALCIUM 8.0* 7.9* 8.1* 8.2* 8.5  MG  --  2.4 2.2  --  2.5  PHOS 3.1 2.8 2.7 3.4 3.6  PROT  --   --   --   --  5.5*  ALBUMIN 1.3*  --   --  1.3* 1.5*  AST  --   --   --   --  258*  ALT  --   --   --   --  77*  ALKPHOS  --   --   --   --  326*  BILITOT  --   --   --   --  7.9*  TRIG  --   --   --   --  139   Estimated Creatinine Clearance: 56.4 ml/min (by C-G formula based on Cr of 0.4).    Recent Labs  08/18/13 1916 08/18/13 2353 08/19/13 0353  GLUCAP 129* 115* 121*    Insulin Requirements in the past 24 hours:  14 units moderate SSI + 80 units regular insulin in TPN with CBGs 115-151  Current Nutrition:  Clinimix E 5/15 at 37ml/hr and Lipids 52ml/hr on Mon/Fri (decrease to 2d per week to prevent essential FA def)  provides an average of 1551 kCal and 100gm protein daily.  Nutritional Goals:  1393 kCal (goal for permissive underfeeding ~1200 kCal); protein 115 gm per day- RD note 3/11  Assessment: 68 YOF admitted 07/19/13 to [facility] for planned repair of enterocolonic fistula. Post-op course complicated by ileus, renal failure, and cardiopulmonary arrest.  Patient transferred to [facility] on 08/12/2013. +CVVHD.  GI: bowels are too edematous to tolerate TF and may trickle feed when volume status improves per Surgery. Baseline prealbumin was 15.6 now 12.3. TG 139. NG output 22mL, Drains output 13mL. Abdomen with open wound and bilious drainage from fistula.  Endo: no hx DM - requiring significant amounts of insulin. CBGs are better controlled with increase in insulin in TPN bag. Noted stress dose steroids were started yesterday  Lytes: Low Na 129, Cl low 95, K good 5, Phos 3.6, Mag 2.5, CorCa 10.5 (Ca x Phos product = 38)  Renal: hx CKD- CRRT since 08/08/13 for volume regulation. SCr/BUN improving, up 60-70lbs since admit with diffuse  anasarca (55kg at [place], initial weight at [facility] was 86kg, now at 66.9kg).  NS at 10 ml/hr.  Still anuric. TPN providing 2 g/kg/day of protein while on CRRT- also provides ~2L fluid per day. Noted Dr. Posey Pronto mentioned removing K from TPN.  Pulm: intubated at [facility] - FiO2 30% (stable). Now trached 3/11 remains on vent support.  Cards: AoC dHF (EF 35-40%), s/p cardiac arrest at [facility] 2/22.  Still requiring Levophed and vasopressin- BP remains soft, HR ok, but can become tachy.   Hepatobil: AST increased again to 258, ALT stable at 77, Tbili increased again to 7.9, alk phos increased to 326  Neuro: GCS 10, RASS -2- only on PRN fentanyl which has not been given for a few days  Heme: Transfused yesterday- Hgb stable at 8.2. Plts only 59  ID: Primaxin/Micafungin/Zyvox for intra-abd infxn + Enterococcus UTI, WBC down to 16, currently afebrile   Best Practices: PPI IV, MC, SCDs on  patient.  TPN Access: CVC placed 08/24/2013  TPN day#: PTA from [place] since 2/27   Plan:  1. Clinimix 5/15 at 83 ml/hr + lipids at 10 ml/hr on Mon/Fri only to minimize kCal provision. Discussed with Dr. Posey Pronto- Will REMOVE lytes from TPN. Repletion per renal service. 2. Continue daily IV Mulitvitamin in TPN. Since LFTs have worsened, will remove trace elements- add back zinc $RemoveB'5mg'[id number]$  and selenium 42mcg on MWF. Unable to add chromium as per protocol d/t shortage. 3. Continue 80 units regular insulin in TPN 4. Continue moderate scale SSI per MD 5. Conitnue NS KVO 6. CMET, phos and mag in the morning    Christen Bedoya D. Albin Duckett, PharmD, BCPS Clinical Pharmacist Pager: [phone] 08/19/2013 8:47 AM

## 2013-08-19 NOTE — Progress Notes (Signed)
No acute surgical issues at this point.  Agree with palliative care input depending on how she does.

## 2013-08-19 NOTE — Progress Notes (Signed)
Subjective: Intubated on vent, getting CVVHD, no significant changes.  Husband notes her swelling has improved slowly.    SIGNIFICANT EVENTS / STUDIES:  2/13 OR >>> Ex-lap with small bowel resection, extensive lysis of adhesions, vomiting  2/22 Cardiac arrest - VDRF/Intubated  08/22/2013 - ADMIT [facility]  3/05 TTE: EF 35-40, grade 2 diastolic dysfunction  3/05 CVVHD started  3/8 - No new issues. Tolerates PS 10 cm H2O  08/12/13: 40# off since [facility] with CRRT. Still anuric. Patient is DNAR but full medical care. On levophed 11mcg.. Family at bedside  08/13/13: No sedation but on levophed 20mcg. RN says occ opens eyes but unresponsive to this MD (last fentanyl was 1aM)  08/14/13: For Trach today. WC upa at 40; worsening LLQ enteric fistula iwtih collection on CT; high risk for re-exploration per CCS. Still on levophed at 28mcg. On CRRT. Is overbreathing vent; opens eyes  3/16- remains with high pressor needs    Objective: Vital signs in last 24 hours: Temp:  [97.7 F (36.5 C)-98.8 F (37.1 C)] 98.6 F (37 C) (03/16 0908) Pulse Rate:  [62-112] 98 (03/16 0900) Resp:  [16-35] 23 (03/16 0900) BP: (93-119)/(38-84) 111/55 mmHg (03/16 0900) SpO2:  [95 %-100 %] 97 % (03/16 0900) Arterial Line BP: (106-127)/(47-59) 110/48 mmHg (03/15 1600) FiO2 (%):  [30 %] 30 % (03/16 0800) Weight:  [147 lb 7.8 oz (66.9 kg)] 147 lb 7.8 oz (66.9 kg) (03/16 0400) Last BM Date: 08/14/13  Intake/Output from previous day: 03/15 0701 - 03/16 0700 In: 3725.9 [I.V.:1243.9; NG/GT:90; IV Piggyback:400; TPN:1992] Out: 3784 [Emesis/NG output:450; Drains:75] Intake/Output this shift: Total I/O In: 246.2 [I.V.:80.2; TPN:166] Out: 258 [Other:258]   PE: Gen:  Alert, NAD, pleasant Card:  Tachycardic, hypotensive on pressors, no murmurs heard Pulm:  On vent, Course breath sounds with rhonchi throughout Abd: Soft, ND, mild tenderness, midline staples in place but dehiscence noted at inferior, few staples just  hanging off near inferior wound were removed so they did not fall into the wound.  Rest of the staples were left intact.  Few BS, no HSM, fistula draining dark green bilious output, colostomy in LLQ red and viable with brownish liquid stool  Ext:  +2 pitting edema b/l UE and LE   Lab Results:   Recent Labs  08/18/13 0510 08/19/13 0516  WBC 16.4* 16.0*  HGB 8.3* 8.2*  HCT 25.7* 25.7*  PLT 48* 59*   BMET  Recent Labs  08/18/13 1600 08/19/13 0516  NA 128* 129*  K 4.9 5.0  CL 95* 95*  CO2 22 24  GLUCOSE 141* 116*  BUN 34* 37*  CREATININE 0.39* 0.40*  CALCIUM 8.2* 8.5   PT/INR No results found for this basename: LABPROT, INR,  in the last 72 hours CMP     Component Value Date/Time   NA 129* 08/19/2013 0516   K 5.0 08/19/2013 0516   CL 95* 08/19/2013 0516   CO2 24 08/19/2013 0516   GLUCOSE 116* 08/19/2013 0516   BUN 37* 08/19/2013 0516   CREATININE 0.40* 08/19/2013 0516   CALCIUM 8.5 08/19/2013 0516   PROT 5.5* 08/19/2013 0516   ALBUMIN 1.5* 08/19/2013 0516   AST 258* 08/19/2013 0516   ALT 77* 08/19/2013 0516   ALKPHOS 326* 08/19/2013 0516   BILITOT 7.9* 08/19/2013 0516   GFRNONAA >90 08/19/2013 0516   GFRAA >90 08/19/2013 0516   Lipase  No results found for this basename: lipase       Studies/Results: Dg Chest Port 1  View  08/19/2013   CLINICAL DATA:  Check endotracheal tube, shortness of breath  EXAM: PORTABLE CHEST - 1 VIEW  COMPARISON:  08/18/2013  FINDINGS: Cardiac shadow is stable. Diffuse bilateral infiltrates are again identified. Bilateral septal lines are again identified and stable. A nasogastric catheter is noted within the stomach. The tracheostomy tube is seen in satisfactory position.  IMPRESSION: Bilateral infiltrates stable from the prior exam. No new focal abnormality is seen.   Electronically Signed   By: Alcide Clever M.D.   On: 08/19/2013 07:11   Dg Chest Port 1 View  08/18/2013   CLINICAL DATA:  Intubated  EXAM: PORTABLE CHEST - 1 VIEW  COMPARISON:  DG  CHEST 1V PORT dated 08/16/2013  FINDINGS: Tracheostomy remains appropriately positioned. Nasogastric tube tip terminates below the level of the hemidiaphragms but is not included in the field of view. Right IJ central line tip terminates over the distal SVC. Left IJ line tip terminates over the proximal SVC. Evidence of CABG noted. Mild cardiomegaly reidentified with slight increase in patchy bilateral lower lobe predominant airspace opacities. Trace pleural effusions are noted with interstitial Kerley B-lines.  IMPRESSION: Increased bibasilar airspace opacities likely reflecting pulmonary edema although worsening pneumonia or other airspace disease could appear similar.   Electronically Signed   By: Christiana Pellant M.D.   On: 08/18/2013 08:55    Anti-infectives: Anti-infectives   Start     Dose/Rate Route Frequency Ordered Stop   08/19/13 1000  linezolid (ZYVOX) IVPB 600 mg     600 mg 300 mL/hr over 60 Minutes Intravenous Every 12 hours 08/19/13 0842     08/15/13 1200  micafungin (MYCAMINE) 100 mg in sodium chloride 0.9 % 100 mL IVPB     100 mg 100 mL/hr over 1 Hours Intravenous Daily 08/15/13 1134     08/10/13 0600  imipenem-cilastatin (PRIMAXIN) 250 mg in sodium chloride 0.9 % 100 mL IVPB     250 mg 200 mL/hr over 30 Minutes Intravenous 3 times per day 08/09/13 0021     08/09/13 1000  imipenem-cilastatin (PRIMAXIN) 250 mg in sodium chloride 0.9 % 100 mL IVPB  Status:  Discontinued     250 mg 200 mL/hr over 30 Minutes Intravenous Every 12 hours 08/09/13 0019 08/09/13 0021   08/08/13 1000  micafungin (MYCAMINE) 100 mg in sodium chloride 0.9 % 100 mL IVPB  Status:  Discontinued     100 mg 100 mL/hr over 1 Hours Intravenous Daily 08/08/13 0950 08/08/13 1001   08/15/2013 1800  vancomycin (VANCOCIN) IVPB 1000 mg/200 mL premix  Status:  Discontinued     1,000 mg 200 mL/hr over 60 Minutes Intravenous Every 24 hours 08/18/2013 1625 08/11/13 1057   08/04/2013 1700  imipenem-cilastatin (PRIMAXIN) 250 mg in  sodium chloride 0.9 % 100 mL IVPB  Status:  Discontinued     250 mg 200 mL/hr over 30 Minutes Intravenous 3 times per day 08/13/2013 1624 08/09/13 0019     ANTIBIOTICS: (pt was on diflucan, vanco, imipenem at OSH)  Vanco 3/4>>> 3/7 Imipenem 3/4>>> Microfungin 3/12 >>> Linezolid 3/16 >>>   Assessment/Plan s/p Hartman's procedure at [facility] complicated by SB fistula, now with sepsis and enterocutaneous fistula (@ [place] [facility] 07/19/13 - 08/21/2013) Septic shock - hypotension requiring pressors  Plan: 1.  Continue current wound care management of EC fistulas with Eakins pouch 2.  Continue WOC nursing management and ostomy care 3.  Discussed with husband at bedside 4.  May need to have a palliative care discussion  LOS: 12 days    DORT, Aundra Millet 08/19/2013, 9:58 AM Pager: [phone]

## 2013-08-20 ENCOUNTER — Inpatient Hospital Stay (HOSPITAL_COMMUNITY): Payer: Medicare Other

## 2013-08-20 LAB — CBC
HCT: 25.7 % — ABNORMAL LOW (ref 36.0–46.0)
HEMOGLOBIN: 7.8 g/dL — AB (ref 12.0–15.0)
MCH: 31.1 pg (ref 26.0–34.0)
MCHC: 30.4 g/dL (ref 30.0–36.0)
MCV: 102.4 fL — ABNORMAL HIGH (ref 78.0–100.0)
PLATELETS: 73 10*3/uL — AB (ref 150–400)
RBC: 2.51 MIL/uL — AB (ref 3.87–5.11)
RDW: 25 % — ABNORMAL HIGH (ref 11.5–15.5)
WBC: 12.8 10*3/uL — ABNORMAL HIGH (ref 4.0–10.5)

## 2013-08-20 LAB — RENAL FUNCTION PANEL
Albumin: 1.4 g/dL — ABNORMAL LOW (ref 3.5–5.2)
BUN: 38 mg/dL — ABNORMAL HIGH (ref 6–23)
CALCIUM: 7.7 mg/dL — AB (ref 8.4–10.5)
CO2: 24 mEq/L (ref 19–32)
CREATININE: 0.44 mg/dL — AB (ref 0.50–1.10)
Chloride: 97 mEq/L (ref 96–112)
GFR calc Af Amer: >90 mL/min (ref >90)
Glucose, Bld: 141 mg/dL — ABNORMAL HIGH (ref 70–99)
PHOSPHORUS: 2.3 mg/dL (ref 2.3–4.6)
Potassium: 5 mEq/L (ref 3.7–5.3)
Sodium: 131 mEq/L — ABNORMAL LOW (ref 137–147)

## 2013-08-20 LAB — GLUCOSE, CAPILLARY
GLUCOSE-CAPILLARY: 107 mg/dL — AB (ref 70–99)
GLUCOSE-CAPILLARY: 165 mg/dL — AB (ref 70–99)
Glucose-Capillary: 122 mg/dL — ABNORMAL HIGH (ref 70–99)
Glucose-Capillary: 111 mg/dL — ABNORMAL HIGH (ref 70–99)
Glucose-Capillary: 117 mg/dL — ABNORMAL HIGH (ref 70–99)

## 2013-08-20 LAB — COMPREHENSIVE METABOLIC PANEL
ALBUMIN: 1.2 g/dL — AB (ref 3.5–5.2)
ALT: 89 U/L — ABNORMAL HIGH (ref 0–35)
AST: 294 U/L — AB (ref 0–37)
Alkaline Phosphatase: 284 U/L — ABNORMAL HIGH (ref 39–117)
BUN: 33 mg/dL — ABNORMAL HIGH (ref 6–23)
CALCIUM: 6.8 mg/dL — AB (ref 8.4–10.5)
CO2: 20 meq/L (ref 19–32)
Chloride: 104 mEq/L (ref 96–112)
Creatinine, Ser: 0.34 mg/dL — ABNORMAL LOW (ref 0.50–1.10)
GFR calc Af Amer: >90 mL/min (ref >90)
Glucose, Bld: 94 mg/dL (ref 70–99)
Potassium: 3.5 mEq/L — ABNORMAL LOW (ref 3.7–5.3)
Sodium: 135 mEq/L — ABNORMAL LOW (ref 137–147)
Total Bilirubin: 6.3 mg/dL — ABNORMAL HIGH (ref 0.3–1.2)
Total Protein: 4.4 g/dL — ABNORMAL LOW (ref 6.0–8.3)

## 2013-08-20 LAB — CBC WITH DIFFERENTIAL/PLATELET
BASOS PCT: 0 % (ref 0–1)
Basophils Absolute: 0 10*3/uL (ref 0.0–0.1)
Eosinophils Absolute: 0 10*3/uL (ref 0.0–0.7)
Eosinophils Relative: 0 % (ref 0–5)
HCT: 21.3 % — ABNORMAL LOW (ref 36.0–46.0)
HEMOGLOBIN: 6.7 g/dL — AB (ref 12.0–15.0)
Lymphocytes Relative: 7 % — ABNORMAL LOW (ref 12–46)
Lymphs Abs: 0.8 10*3/uL (ref 0.7–4.0)
MCH: 31.6 pg (ref 26.0–34.0)
MCHC: 31.5 g/dL (ref 30.0–36.0)
MCV: 100.5 fL — ABNORMAL HIGH (ref 78.0–100.0)
MONO ABS: 1 10*3/uL (ref 0.1–1.0)
Monocytes Relative: 9 % (ref 3–12)
Neutro Abs: 9.7 10*3/uL — ABNORMAL HIGH (ref 1.7–7.7)
Neutrophils Relative %: 84 % — ABNORMAL HIGH (ref 43–77)
Platelets: 58 10*3/uL — ABNORMAL LOW (ref 150–400)
RBC: 2.12 MIL/uL — AB (ref 3.87–5.11)
RDW: 24.9 % — ABNORMAL HIGH (ref 11.5–15.5)
WBC: 11.5 10*3/uL — ABNORMAL HIGH (ref 4.0–10.5)

## 2013-08-20 LAB — POCT I-STAT 3, ART BLOOD GAS (G3+)
Acid-base deficit: 2 mmol/L (ref 0.0–2.0)
BICARBONATE: 23.2 meq/L (ref 20.0–24.0)
O2 SAT: 88 %
TCO2: 24 mmol/L (ref 0–100)
pCO2 arterial: 40.2 mmHg (ref 35.0–45.0)
pH, Arterial: 7.366 (ref 7.350–7.450)
pO2, Arterial: 55 mmHg — ABNORMAL LOW (ref 80.0–100.0)

## 2013-08-20 LAB — PHOSPHORUS: PHOSPHORUS: 2.1 mg/dL — AB (ref 2.3–4.6)

## 2013-08-20 LAB — MAGNESIUM: MAGNESIUM: 1.9 mg/dL (ref 1.5–2.5)

## 2013-08-20 MED ORDER — CLINIMIX/DEXTROSE (5/15) 5 % IV SOLN
INTRAVENOUS | Status: AC
  Administered 2013-08-20: 18:00:00 via INTRAVENOUS
  Filled 2013-08-20: qty 2000

## 2013-08-20 MED ORDER — POTASSIUM CHLORIDE 10 MEQ/100ML IV SOLN
10.0000 meq | INTRAVENOUS | Status: AC
  Administered 2013-08-20 (×3): 10 meq via INTRAVENOUS
  Filled 2013-08-20 (×3): qty 100

## 2013-08-20 NOTE — Progress Notes (Signed)
PARENTERAL NUTRITION CONSULT NOTE:  FOLLOW-UP  Pharmacy Consult:  TPN Indication: Suspect TF intolerance d/t to severe bowel edema  Allergies  Allergen Reactions  . Cephalexin Other (See Comments)    Unknown  . Codeine Nausea And Vomiting    Patient Measurements: Height: 5' 1" (154.9 cm) Weight: 147 lb 0.8 oz (66.7 kg) IBW/kg (Calculated) : 47.8  Usual Weight: 55 kg (this was admit weight at [facility] 2/13)  Vital Signs: Temp: 97.5 F (36.4 C) (03/17 0839) Temp src: Oral (03/17 0839) BP: 105/51 mmHg (03/17 0749) Pulse Rate: 108 (03/17 0749) Intake/Output from previous day: 03/16 0701 - 03/17 0700 In: 4048.4 [I.V.:806.4; NG/GT:120; IV Piggyback:1000; TPN:2122] Out: 3757 [Emesis/NG output:150; Drains:230]  Labs:  Recent Labs  08/18/13 0510 08/19/13 0516 08/20/13 0508  WBC 16.4* 16.0* 11.5*  HGB 8.3* 8.2* 6.7*  HCT 25.7* 25.7* 21.3*  PLT 48* 59* 58*     Recent Labs  08/18/13 0510  08/19/13 0516 08/19/13 0930 08/19/13 1631 08/20/13 0508  NA 126*  < > 129*  --  130* 135*  K 4.9  < > 5.0  --  5.2 3.5*  CL 93*  < > 95*  --  95* 104  CO2 21  < > 24  --  25 20  GLUCOSE 151*  < > 116*  --  99 94  BUN 32*  < > 37*  --  40* 33*  CREATININE 0.35*  < > 0.40*  --  0.43* 0.34*  CALCIUM 8.1*  < > 8.5  --  8.6 6.8*  MG 2.2  --  2.5  --   --  1.9  PHOS 2.7  < > 3.6  --  4.7* 2.1*  PROT  --   --  5.5*  --   --  4.4*  ALBUMIN  --   < > 1.5*  --  1.5* 1.2*  AST  --   --  258*  --   --  294*  ALT  --   --  77*  --   --  89*  ALKPHOS  --   --  326*  --   --  284*  BILITOT  --   --  7.9* 7.9*  --  6.3*  BILIDIR  --   --   --  6.8*  --   --   IBILI  --   --   --  1.1*  --   --   PREALBUMIN  --   --  9.6*  --   --   --   TRIG  --   --  139  --   --   --   < > = values in this interval not displayed. Estimated Creatinine Clearance: 56.4 ml/min (by C-G formula based on Cr of 0.34).    Recent Labs  08/19/13 2330 08/20/13 0311 08/20/13 0825  GLUCAP 143* 122* 107*     Insulin Requirements in the past 24 hours:  6 units moderate SSI + 80 units regular insulin in TPN with CBGs 94-143  Current Nutrition:  Clinimix NO LYTES 5/15 at 5m/hr + Lipids 131mhr on Mon/Fri (decrease to 2d per week to prevent essential fatty acid deficiency) provides an average of 1551 kCal and 100gm protein daily.  Nutritional Goals:  1393 kCal (goal for permissive underfeeding ~1200 kCal); protein 115 gm per day- RD note 3/11  Assessment: [id number]. Post-op course complicated by ileus, renal failure, and  cardiopulmonary arrest.  Patient transferred to [facility] on 08/16/2013. +CVVHD.  GI: bowels are too edematous to tolerate TF, may start trickle feed when volume status improves per Surgery. Baseline prealbumin was 15.6, decreased to 12.3, decreased further this week to 9.6. TG 139. NG output 156m/24h, Drains output 2334m24h. Abdomen with open wound and bilious drainage from fistula- wound care seeing  Endo: no hx DM - requiring significant amounts of insulin. CBGs are better controlled with increase in insulin in TPN bag. Noted stress dose steroids were started 3/15  Lytes: Na 135, K 3.5- being repleted with 3 runs per renal orders, Phos 2.1- decrease after removing lytes, Mag 1.9, CorCa 9 (Ca x Phos product = 19)  Renal: hx CKD- CRRT since 08/08/13 for volume regulation. SCr/BUN improved, up 60-70lbs since admit with diffuse anasarca ([facility] now at [facility]).  NS at 10 ml/hr.  Still anuric. TPN providing 2 g/kg/day of protein while on CRRT- also provides ~2L fluid per day. Lytes were removed from TPN per renal request  Pulm: [facility] ([facility] Now trached 3/11 remains on vent support.  Cards: AoC dHF (EF 35-40%), [facility]  Still requiring Levophed and vasopressin- BP remains soft, HR ok, but can become tachy.   Hepatobil:  no LFTs are WNL- AST increased again to 294, ALT increase to 89, Tbili decreased to 6.3 (still high), alk phos decreased to 284, albumin still low at 1.2  Neuro: GCS 12, RASS -2- only on PRN fentanyl which has not been given for a few days  Heme: Hgb dropped to 6.7, plts 58. No overt bleeding noted  ID: Primaxin/Micafungin/Zyvox for intra-abd infxn + Enterococcus UTI, WBC down to 11.5, currently afebrile   Best Practices: PPI IV, MC, SCDs on patient.  TPN Access: CVC placed 08/18/2013  [facility]   Plan:  1. Continue Clinimix NO LYTES 5/15 at 83 ml/hr + lipids at 10 ml/hr on Mon/Fri only to minimize kCal provision. 2. Continue daily IV Mulitvitamin in TPN. Since LFTs have worsened, will remove trace elements- add back zinc 80m66mnd selenium 14m69mn MWF. Unable to add chromium as per protocol d/t shortage. 3. Continue 80 units regular insulin in TPN 4. Continue moderate scale SSI per MD 5. Conitnue NS KVO 6. CMET, phos and mag in the morning    Deven Furia D. Ahnna Dungan, PharmD, BCPS Clinical Pharmacist Pager: [phone] 8:44 AM

## 2013-08-20 NOTE — Progress Notes (Signed)
Subjective: On vent   Objective: Vital signs in last 24 hours: Temp:  [97.4 F (36.3 C)-98.6 F (37 C)] 97.6 F (36.4 C) (03/17 0400) Pulse Rate:  [63-109] 108 (03/17 0749) Resp:  [13-30] 30 (03/17 0749) BP: (88-120)/(40-79) 105/51 mmHg (03/17 0749) SpO2:  [93 %-100 %] 100 % (03/17 0749) FiO2 (%):  [30 %-40 %] 40 % (03/17 0749) Weight:  [147 lb 0.8 oz (66.7 kg)] 147 lb 0.8 oz (66.7 kg) (03/17 0400) Last BM Date: 08/14/13  Intake/Output from previous day: 03/16 0701 - 03/17 0700 In: 4048.4 [I.V.:806.4; NG/GT:120; IV Piggyback:1000; TPN:2122] Out: 3757 [Emesis/NG output:150; Drains:230] Intake/Output this shift:    Incision/Wound:staples in place.  Ostomy pink and viable. Fistula controlled.   Lab Results:   Recent Labs  08/19/13 0516 08/20/13 0508  WBC 16.0* 11.5*  HGB 8.2* 6.7*  HCT 25.7* 21.3*  PLT 59* 58*   BMET  Recent Labs  08/19/13 1631 08/20/13 0508  NA 130* 135*  K 5.2 3.5*  CL 95* 104  CO2 25 20  GLUCOSE 99 94  BUN 40* 33*  CREATININE 0.43* 0.34*  CALCIUM 8.6 6.8*   PT/INR No results found for this basename: LABPROT, INR,  in the last 72 hours ABG  Recent Labs  08/20/13 0520  PHART 7.366  HCO3 23.2    Studies/Results: Dg Chest Port 1 View  08/20/2013   CLINICAL DATA:  Respiratory failure  EXAM: PORTABLE CHEST - 1 VIEW  COMPARISON:  DG CHEST 1V PORT dated 08/19/2013  FINDINGS: Again demonstrated are bilateral interstitial and confluent alveolar infiltrates. The left hemidiaphragm remains obscured. The cardiopericardial silhouette where visualized is top-normal in size and stable. The patient has undergone previous CABG. The pulmonary vascularity is indistinct. There is no pneumothorax or pneumomediastinum.  The tracheostomy appliance tip is stable lying at the level of the inferior margin of the clavicular heads. The esophagogastric tube tip and proximal port project off the inferior margin of the film. The right and left internal jugular  venous catheters appear stable in position.  IMPRESSION: The appearance of the chest has slightly deteriorated since the previous study with increased conspicuity of bilateral airspace disease. While pneumonia and CHF are certainly in the differential, ARDS may well be developing.   Electronically Signed   By: David  [place]   On: 08/20/2013 07:57   Dg Chest Port 1 View  08/19/2013   CLINICAL DATA:  Check endotracheal tube, shortness of breath  EXAM: PORTABLE CHEST - 1 VIEW  COMPARISON:  08/18/2013  FINDINGS: Cardiac shadow is stable. Diffuse bilateral infiltrates are again identified. Bilateral septal lines are again identified and stable. A nasogastric catheter is noted within the stomach. The tracheostomy tube is seen in satisfactory position.  IMPRESSION: Bilateral infiltrates stable from the prior exam. No new focal abnormality is seen.   Electronically Signed   By: Alcide Clever M.D.   On: 08/19/2013 07:11    Anti-infectives: Anti-infectives   Start     Dose/Rate Route Frequency Ordered Stop   08/19/13 1000  linezolid (ZYVOX) IVPB 600 mg     600 mg 300 mL/hr over 60 Minutes Intravenous Every 12 hours 08/19/13 0842     08/15/13 1200  micafungin (MYCAMINE) 100 mg in sodium chloride 0.9 % 100 mL IVPB     100 mg 100 mL/hr over 1 Hours Intravenous Daily 08/15/13 1134     08/10/13 0600  imipenem-cilastatin (PRIMAXIN) 250 mg in sodium chloride 0.9 % 100 mL IVPB     250  mg 200 mL/hr over 30 Minutes Intravenous 3 times per day 08/09/13 0021     08/09/13 1000  imipenem-cilastatin (PRIMAXIN) 250 mg in sodium chloride 0.9 % 100 mL IVPB  Status:  Discontinued     250 mg 200 mL/hr over 30 Minutes Intravenous Every 12 hours 08/09/13 0019 08/09/13 0021   08/08/13 1000  micafungin (MYCAMINE) 100 mg in sodium chloride 0.9 % 100 mL IVPB  Status:  Discontinued     100 mg 100 mL/hr over 1 Hours Intravenous Daily 08/08/13 0950 08/08/13 1001   08/30/2013 1800  vancomycin (VANCOCIN) IVPB 1000 mg/200 mL premix   Status:  Discontinued     1,000 mg 200 mL/hr over 60 Minutes Intravenous Every 24 hours 08/19/2013 1625 08/11/13 1057   09/01/2013 1700  imipenem-cilastatin (PRIMAXIN) 250 mg in sodium chloride 0.9 % 100 mL IVPB  Status:  Discontinued     250 mg 200 mL/hr over 30 Minutes Intravenous 3 times per day 08/18/2013 1624 08/09/13 0019      Assessment/Plan: No significant changes.  Would CT if inotropic support goes up or a rising trend in WBC.  Wounds and ostomy look fine and fistula is controlled.   LOS: 13 days    Jenavi Beedle A. 08/20/2013

## 2013-08-20 NOTE — Progress Notes (Signed)
Patient ID: Leah [name], female   DOB: 09/08/1940, 73 y.o.   MRN: [id number]    [facility] Progress Note    Assessment/ Plan:   1 Anuric AKI: From ischemic/sepsis associated ATN-- continue CRRT at this time (keeping even due to pressor needs) given high daily fluid load from TPN and catabolic rate from sepsis. Continue CRRT at this time. 2. [facility] by small bowel fistula, enterocutaneous fistula and sepsis-with septic shock and on broad-spectrum antibiotics. Still with difficulty to wean pressors. 3 Volume overload: Improved with recent ultrafiltration however, still volume overloaded with difficulty in ultrafiltration. 4 Hyponatremia: Due to limitations with UF and ongoing (hypotonic) TPN  5 Hyperkalemia: Resolved after Potassium discontinued in TPN-we'll give piggyback potassium replacement as needed. On 4K bath.   Subjective:   No acute events overnight-surgery/critical care note reviewed. Critical low hemoglobin noted this morning.   Objective:   BP 100/45  Pulse 80  Temp(Src) 97.6 F (36.4 C) (Oral)  Resp 13  Ht 5\' 1"  (1.549 m)  Wt 66.7 kg (147 lb 0.8 oz)  BMI 27.80 kg/m2  SpO2 100%  Intake/Output Summary (Last 24 hours) at 08/20/13 0740 Last data filed at 08/20/13 0700  Gross per 24 hour  Intake 4048.38 ml  Output   3757 ml  Net 291.38 ml   Weight change: -0.2 kg (-7.1 oz)  Physical Exam: Gen: On ventilator via tracheostomy, sedated CVS: Pulse regular in rate and rhythm, S1 and S2 normal Resp: Coarse/mechanical breath sounds bilaterally Abd: Soft, distended, lower abdominal open abdomen dressing/drainage tube in situ Ext: 2-3+ edema over upper and lower extremities  Imaging: Dg Chest Port 1 View  08/19/2013   CLINICAL DATA:  Check endotracheal tube, shortness of breath  EXAM: PORTABLE CHEST - 1 VIEW  COMPARISON:  08/18/2013  FINDINGS: Cardiac shadow is stable. Diffuse bilateral infiltrates are  again identified. Bilateral septal lines are again identified and stable. A nasogastric catheter is noted within the stomach. The tracheostomy tube is seen in satisfactory position.  IMPRESSION: Bilateral infiltrates stable from the prior exam. No new focal abnormality is seen.   Electronically Signed   By: Alcide CleverMark  Lukens M.D.   On: 08/19/2013 07:11    Labs: BMET  Recent Labs Lab 08/16/13 1600 08/17/13 0239 08/18/13 0510 08/18/13 1600 08/19/13 0516 08/19/13 1631 08/20/13 0508  NA 129* 128* 126* 128* 129* 130* 135*  K 4.6 4.6 4.9 4.9 5.0 5.2 3.5*  CL 95* 94* 93* 95* 95* 95* 104  CO2 22 20 21 22 24 25 20   GLUCOSE 159* 200* 151* 141* 116* 99 94  BUN 33* 31* 32* 34* 37* 40* 33*  CREATININE 0.41* 0.39* 0.35* 0.39* 0.40* 0.43* 0.34*  CALCIUM 8.0* 7.9* 8.1* 8.2* 8.5 8.6 6.8*  PHOS 3.1 2.8 2.7 3.4 3.6 4.7* 2.1*   CBC  Recent Labs Lab 08/17/13 0239 08/18/13 0510 08/19/13 0516 08/20/13 0508  WBC 19.2* 16.4* 16.0* 11.5*  NEUTROABS 16.3* 14.9* 14.1* 9.7*  HGB 6.5* 8.3* 8.2* 6.7*  HCT 20.3* 25.7* 25.7* 21.3*  MCV 97.6 95.2 97.3 100.5*  PLT 53* 48* 59* 58*    Medications:    . antiseptic oral rinse  15 mL Mouth Rinse QID  . chlorhexidine  15 mL Mouth Rinse BID  . hydrocortisone sod succinate (SOLU-CORTEF) inj  50 mg Intravenous Q6H  . imipenem-cilastatin  250 mg Intravenous 3 times per day  . insulin aspart  0-15 Units Subcutaneous 6 times per day  .  linezolid  600 mg Intravenous Q12H  . micafungin (MYCAMINE) IV  100 mg Intravenous Daily  . pantoprazole (PROTONIX) IV  40 mg Intravenous Q24H  . sodium chloride  2,000 mL Intravenous Once   Zetta Bills, MD 08/20/2013, 7:40 AM

## 2013-08-20 NOTE — Progress Notes (Addendum)
PULMONARY / CRITICAL CARE MEDICINE   Name: Leah [name] MRN: [id number] DOB: Oct 30, 1940  LOS 13 days    ADMISSION DATE:  08/24/2013  REFERRING MD :  [facility]  PRIMARY SERVICE: PCCM  CHIEF COMPLAINT:  Respiratory Failure   BRIEF PATIENT DESCRIPTION: 73 y/o admitted to [facility] s/p Hartman's procedure at [facility] complicated by SB fistula, now with sepsis and enterocutaneous fistula. .  Course was complicated by ileus, renal failure, cardiopulmonary arrest.  Transferred to [facility] on 3/4 for further care.   LINES / TUBES: R IJ TLC 2/14 >> 3/04 ETT(Lugoff)  2/23 >> 08/14/13, 3/11 (JY) >> R IJ HD cath 3/04 >>  L IJ TLC 3/04 >>  ? Date aline >.3/15  CULTURES: ?date [facility]) >>> ENTEROCOCCUS FAECALIS 3/4 MRSA PCR >>> neg 3/4 Urine >>>  Multiple bacterial morphotypes  3/5 Blood >>  Neg 3/16 BC>>>  ANTIBIOTICS: Vancomycin 3/4 >> 3/08 Imipenem 3/4 >>> Mycafungin 3/12 >>> linazolid 3/16 (continued high pressors, high risk VRE, plat count noted 58)>>>  SIGNIFICANT EVENTS / STUDIES:  2/13  OR >>> Ex-lap with small bowel resection, extensive lysis of adhesions, vomiting 2/22  Cardiac arrest - VDRF/Intubated 08/14/2013 - ADMIT [facility] 3/05  TTE: EF 35-40, grade 2 diastolic dysfunction  3/05  CVVHD started 3/8 - No new issues. Tolerates PS 10 cm H2O 08/12/13: 40# off [facility] with [facility]. Still anuric. Patient is DNAR but full medical care. On levophed .. Family at bedside 08/13/13: No sedation but on levophed . RN says occ opens eyes but unresponsive to this MD (last fentanyl was 1aM) 08/14/13: For Trach today. WC upa at 40; worsening LLQ enteric fistula iwtih collection on CT; high risk for re-exploration per CCS. Still on levophed at . On CRRT. Is overbreathing vent; opens eyes 3/16- remains with high pressor needs 3/17 pressors are weaning down  SUBJECTIVE/OVERNIGHT/INTERVAL HX Pressors weaning  VITAL SIGNS: Temp:  [97.4  F (36.3 C)-98.4 F (36.9 C)] 97.5 F (36.4 C) (03/17 0839) Pulse Rate:  [63-109] 91 (03/17 1100) Resp:  [13-30] 26 (03/17 1100) BP: (88-118)/(40-66) 104/53 mmHg (03/17 1100) SpO2:  [93 %-100 %] 97 % (03/17 1100) FiO2 (%):  [30 %-40 %] 40 % (03/17 1100) Weight:  [66.7 kg (147 lb 0.8 oz)] 66.7 kg (147 lb 0.8 oz) (03/17 0400)  HEMODYNAMICS: CVP:  [9 mmHg-10 mmHg] 10 mmHg VENTILATOR SETTINGS: Vent Mode:  [-] PSV;CPAP FiO2 (%):  [30 %-40 %] 40 % Set Rate:  [16 bmp] 16 bmp Vt Set:  [380 mL] 380 mL PEEP:  [5 cmH20] 5 cmH20 Pressure Support:  [10 cmH20] 10 cmH20 Plateau Pressure:  [15 cmH20-20 cmH20] 20 cmH20  INTAKE / OUTPUT: Intake/Output     03/16 0701 - 03/17 0700 03/17 0701 - 03/18 0700   I.V. (mL/kg) 806.4 (12.1) 75 (1.1)   NG/GT 120    IV Piggyback 1000 600   TPN 2122 372   Total Intake(mL/kg) 4048.4 (60.7) 1047 (15.7)   Emesis/NG output 150    Drains 230    Other 3377 726   Total Output 3757 726   Net +291.4 +321         PHYSICAL EXAMINATION: General:  Very deconditioned. Critically ill looking Neuro: Diffusely weak, rass 1, follows commands at times HEENT: WNL Cardiovascular:  RRR s M Lungs: coarse, diminished in bases Abdomen:  BS absent, LLQ colostomy, RLQ fistula draining serous fluid, open wound Ext:  Edema noted  PULMONARY  Recent Labs Lab 08/16/13  1113 08/20/13 0520  PHART  --  7.366  PCO2ART  --  40.2  PO2ART  --  55.0*  HCO3  --  23.2  TCO2  --  24  O2SAT 61.9 88.0    CBC  Recent Labs Lab 08/18/13 0510 08/19/13 0516 08/20/13 0508  HGB 8.3* 8.2* 6.7*  HCT 25.7* 25.7* 21.3*  WBC 16.4* 16.0* 11.5*  PLT 48* 59* 58*    COAGULATION  Recent Labs Lab 08/14/13 1210  INR 1.16    CARDIAC    Recent Labs Lab 08/15/13 1118 08/16/13 0442 08/16/13 1200 08/16/13 1838 08/17/13 0238  TROPONINI <0.30 0.55* 0.62* 0.54* 0.79*   No results found for this basename: PROBNP,  in the last 168 hours   CHEMISTRY  Recent Labs Lab  08/16/13 0442  08/17/13 0239 08/18/13 0510 08/18/13 1600 08/19/13 0516 08/19/13 1631 08/20/13 0508  NA 130*  < > 128* 126* 128* 129* 130* 135*  K 4.7  < > 4.6 4.9 4.9 5.0 5.2 3.5*  CL 95*  < > 94* 93* 95* 95* 95* 104  CO2 22  < > 20 21 22 24 25 20   GLUCOSE 196*  < > 200* 151* 141* 116* 99 94  BUN 30*  < > 31* 32* 34* 37* 40* 33*  CREATININE 0.38*  < > 0.39* 0.35* 0.39* 0.40* 0.43* 0.34*  CALCIUM 8.0*  < > 7.9* 8.1* 8.2* 8.5 8.6 6.8*  MG 2.4  --  2.4 2.2  --  2.5  --  1.9  PHOS 2.7  < > 2.8 2.7 3.4 3.6 4.7* 2.1*  < > = values in this interval not displayed. Estimated Creatinine Clearance: 56.4 ml/min (by C-G formula based on Cr of 0.34).   LIVER  Recent Labs Lab 08/14/13 0430 08/14/13 1210  08/16/13 0442 08/16/13 1600 08/18/13 1600 08/19/13 0516 08/19/13 0930 08/19/13 1631 08/20/13 0508  AST  --   --   --  164*  --   --  258*  --   --  294*  ALT  --   --   --  75*  --   --  77*  --   --  89*  ALKPHOS  --   --   --  199*  --   --  326*  --   --  284*  BILITOT  --   --   --  4.8*  --   --  7.9* 7.9*  --  6.3*  PROT  --   --   --  4.8*  --   --  5.5*  --   --  4.4*  ALBUMIN 1.5*  --   < > 1.4* 1.3* 1.3* 1.5*  --  1.5* 1.2*  INR  --  1.16  --   --   --   --   --   --   --   --   < > = values in this interval not displayed.   INFECTIOUS  Recent Labs Lab 08/14/13 1207 08/15/13 0440 08/15/13 1117 08/16/13 0442  LATICACIDVEN  --   --  2.8*  --   PROCALCITON 4.38 4.91  --  7.95     ENDOCRINE CBG (last 3)   Recent Labs  08/19/13 2330 08/20/13 0311 08/20/13 0825  GLUCAP 143* 122* 107*    IMAGING x48h  Dg Chest Port 1 View  08/20/2013   CLINICAL DATA:  Respiratory failure  EXAM: PORTABLE CHEST - 1 VIEW  COMPARISON:  DG CHEST 1V  PORT dated 08/19/2013  FINDINGS: Again demonstrated are bilateral interstitial and confluent alveolar infiltrates. The left hemidiaphragm remains obscured. The cardiopericardial silhouette where visualized is top-normal in size and  stable. The patient has undergone previous CABG. The pulmonary vascularity is indistinct. There is no pneumothorax or pneumomediastinum.  The tracheostomy appliance tip is stable lying at the level of the inferior margin of the clavicular heads. The esophagogastric tube tip and proximal port project off the inferior margin of the film. The right and left internal jugular venous catheters appear stable in position.  IMPRESSION: The appearance of the chest has slightly deteriorated since the previous study with increased conspicuity of bilateral airspace disease. While pneumonia and CHF are certainly in the differential, ARDS may well be developing.   Electronically Signed   By: David  [place]   On: 08/20/2013 07:57   Dg Chest Port 1 View  08/19/2013   CLINICAL DATA:  Check endotracheal tube, shortness of breath  EXAM: PORTABLE CHEST - 1 VIEW  COMPARISON:  08/18/2013  FINDINGS: Cardiac shadow is stable. Diffuse bilateral infiltrates are again identified. Bilateral septal lines are again identified and stable. A nasogastric catheter is noted within the stomach. The tracheostomy tube is seen in satisfactory position.  IMPRESSION: Bilateral infiltrates stable from the prior exam. No new focal abnormality is seen.   Electronically Signed   By: Alcide CleverMark  Lukens M.D.   On: 08/19/2013 07:11    ASSESSMENT / PLAN:  PULMONARY A: Acute and chronic respiratory failure with prolonged mech vent/chronic critical illness  - s/p trach 08/14/13 COPD without exacerbation LLL PNA on CT abd 08/13/13  - Failure to wean due to severe physical deconditioning, septic shock and poor mental status P:   Remains on low O2 needs Keep same MV when on rest Repeat abg on rest 3/17 po2 55 on 30%, titrate O2 as needed, if desat Wean PS 14 to goal 10 if able, goal 4-6 hrs pcxr in am  Even to neg balance, favor neg  Balance with escalating edema on pcxr  CARDIOVASCULAR A:  Septic shock Cardiogenic shock S/p Cardiopulmonary  arrest Acute on chronic systolic and diastolic CHF NSTEMI 2/23 [facility]([place])  P:  Levophed to MAP goal 60, much improved maintain vaso for now until at 5 mics levo maintain stress steroids at 50mg  q 6 h until off pressors  RENAL A:   ARF, hyponatremia  P:   CRRT per Nephrology Consider  Continued increased neg balance Chem am  GASTROINTESTINAL A:  S/p HArtmans procedure Enterocolonic fistula  Protein calorie malnutrition Cholestasis confirmed by bili  P:   SUP: IV PPI Cont TPN Surgery following CCS says ct abd if pressor needs increase. Currently weaning levo at 8 down from 25.  HEMATOLOGIC A:   Anemia s/p PRBC 08/17/13 Mild thrombocytopenia, extensive hep exposure Plt 59->58 P:  DVT px: SCDs Ensure HIT sent, d/w renal to dc heparin Risk linazolid not out weight usefulness Cbc in pm , may need tx  INFECTIOUS A:   Septic shock persists in setting of complicated GI surgery UTI P:   Continued high pressor needs consider CT for new collection if pressors worsen Added linazolid 3/16, high risk VRE Repeat BC UA Continue imi, caspo  ENDOCRINE A:   Hyperglycemia Likely rel AI P:   Cont SSI  Continue steroids  NEUROLOGIC A:   Acute encephalopathy.  CT head negative for acute abnormalities  P:   Goal RASS 0 to -1 Cont PRN fentanyl Avoid cont sedation  Brett CanalesSteve Minor  ACNP [facility] PCCM Pager [phone] till 3 pm If no answer page [phone] 08/20/2013, 11:30 AM  I have fully examined this patient and agree with above findings.    And edited in full  Ccm time 30 min  Mcarthur Rossetti. Tyson Alias, MD, FACP Pgr: [phone] [facility]

## 2013-08-20 NOTE — Progress Notes (Signed)
CRITICAL VALUE ALERT  Critical value received:  Hgb= 6.7  Date of notification:  08/20/13  Time of notification:  0601  Critical value read back:yes  Nurse who received alert:  Constance HawAlicia Sperry RN  MD notified (1st page):  DR Deterding  Time of first page:  [phone]  MD notified (2nd page):  Time of second page:  Responding MD:  Dr Darrick Pennaeterding  Time MD responded:  [phone]

## 2013-08-21 ENCOUNTER — Inpatient Hospital Stay (HOSPITAL_COMMUNITY): Payer: Medicare Other

## 2013-08-21 LAB — CBC
HCT: 27.1 % — ABNORMAL LOW (ref 36.0–46.0)
HEMATOCRIT: 26.9 % — AB (ref 36.0–46.0)
HEMATOCRIT: 26.9 % — AB (ref 36.0–46.0)
HEMOGLOBIN: 8.3 g/dL — AB (ref 12.0–15.0)
HEMOGLOBIN: 8.6 g/dL — AB (ref 12.0–15.0)
Hemoglobin: 8.2 g/dL — ABNORMAL LOW (ref 12.0–15.0)
MCH: 30.7 pg (ref 26.0–34.0)
MCH: 31.6 pg (ref 26.0–34.0)
MCH: 32.6 pg (ref 26.0–34.0)
MCHC: 30.3 g/dL (ref 30.0–36.0)
MCHC: 30.9 g/dL (ref 30.0–36.0)
MCHC: 32 g/dL (ref 30.0–36.0)
MCV: 101.5 fL — AB (ref 78.0–100.0)
MCV: 101.9 fL — ABNORMAL HIGH (ref 78.0–100.0)
MCV: 102.3 fL — AB (ref 78.0–100.0)
Platelets: 103 10*3/uL — ABNORMAL LOW (ref 150–400)
Platelets: 94 10*3/uL — ABNORMAL LOW (ref 150–400)
Platelets: 95 10*3/uL — ABNORMAL LOW (ref 150–400)
RBC: 2.63 MIL/uL — ABNORMAL LOW (ref 3.87–5.11)
RBC: 2.64 MIL/uL — AB (ref 3.87–5.11)
RBC: 2.67 MIL/uL — AB (ref 3.87–5.11)
RDW: 24.4 % — ABNORMAL HIGH (ref 11.5–15.5)
RDW: 24.7 % — ABNORMAL HIGH (ref 11.5–15.5)
RDW: 24.9 % — ABNORMAL HIGH (ref 11.5–15.5)
WBC: 12.2 10*3/uL — ABNORMAL HIGH (ref 4.0–10.5)
WBC: 11.5 10*3/uL — ABNORMAL HIGH (ref 4.0–10.5)
WBC: 13.2 10*3/uL — AB (ref 4.0–10.5)

## 2013-08-21 LAB — COMPREHENSIVE METABOLIC PANEL
ALT: 128 U/L — ABNORMAL HIGH (ref 0–35)
AST: 386 U/L — AB (ref 0–37)
Albumin: 1.6 g/dL — ABNORMAL LOW (ref 3.5–5.2)
Alkaline Phosphatase: 364 U/L — ABNORMAL HIGH (ref 39–117)
BILIRUBIN TOTAL: 7.3 mg/dL — AB (ref 0.3–1.2)
BUN: 37 mg/dL — AB (ref 6–23)
CHLORIDE: 95 meq/L — AB (ref 96–112)
CO2: 22 mEq/L (ref 19–32)
Calcium: 8.2 mg/dL — ABNORMAL LOW (ref 8.4–10.5)
Creatinine, Ser: 0.38 mg/dL — ABNORMAL LOW (ref 0.50–1.10)
GFR calc Af Amer: >90 mL/min (ref >90)
GFR calc non Af Amer: >90 mL/min (ref >90)
Glucose, Bld: 120 mg/dL — ABNORMAL HIGH (ref 70–99)
POTASSIUM: 4.4 meq/L (ref 3.7–5.3)
Sodium: 129 mEq/L — ABNORMAL LOW (ref 137–147)
Total Protein: 5.7 g/dL — ABNORMAL LOW (ref 6.0–8.3)

## 2013-08-21 LAB — RENAL FUNCTION PANEL
Albumin: 1.7 g/dL — ABNORMAL LOW (ref 3.5–5.2)
BUN: 36 mg/dL — ABNORMAL HIGH (ref 6–23)
CHLORIDE: 92 meq/L — AB (ref 96–112)
CO2: 22 meq/L (ref 19–32)
CREATININE: 0.38 mg/dL — AB (ref 0.50–1.10)
Calcium: 8.2 mg/dL — ABNORMAL LOW (ref 8.4–10.5)
GFR calc Af Amer: >90 mL/min (ref >90)
GFR calc non Af Amer: >90 mL/min (ref >90)
GLUCOSE: 93 mg/dL (ref 70–99)
Phosphorus: 3.2 mg/dL (ref 2.3–4.6)
Potassium: 4 mEq/L (ref 3.7–5.3)
Sodium: 127 mEq/L — ABNORMAL LOW (ref 137–147)

## 2013-08-21 LAB — HEPARIN INDUCED THROMBOCYTOPENIA PNL
Heparin Induced Plt Ab: NEGATIVE
Patient O.D.: 0.032
UFH HIGH DOSE UFH H: 9 %
UFH LOW DOSE 0.5 IU/ML: 7 %
UFH Low Dose 0.1 IU/mL: 8 % Release
UFH SRA Result: NEGATIVE

## 2013-08-21 LAB — GLUCOSE, CAPILLARY
GLUCOSE-CAPILLARY: 110 mg/dL — AB (ref 70–99)
GLUCOSE-CAPILLARY: 140 mg/dL — AB (ref 70–99)
GLUCOSE-CAPILLARY: 87 mg/dL (ref 70–99)
Glucose-Capillary: 105 mg/dL — ABNORMAL HIGH (ref 70–99)
Glucose-Capillary: 134 mg/dL — ABNORMAL HIGH (ref 70–99)
Glucose-Capillary: 93 mg/dL (ref 70–99)

## 2013-08-21 LAB — PHOSPHORUS: PHOSPHORUS: 1.8 mg/dL — AB (ref 2.3–4.6)

## 2013-08-21 LAB — MAGNESIUM: Magnesium: 2.3 mg/dL (ref 1.5–2.5)

## 2013-08-21 MED ORDER — SODIUM PHOSPHATE 3 MMOLE/ML IV SOLN
30.0000 mmol | Freq: Once | INTRAVENOUS | Status: AC
  Administered 2013-08-21: 30 mmol via INTRAVENOUS
  Filled 2013-08-21: qty 10

## 2013-08-21 MED ORDER — IOHEXOL 300 MG/ML  SOLN
25.0000 mL | INTRAMUSCULAR | Status: AC
  Administered 2013-08-21 (×2): 25 mL via ORAL

## 2013-08-21 MED ORDER — M.V.I. ADULT IV INJ
INJECTION | INTRAVENOUS | Status: AC
  Administered 2013-08-21: 17:00:00 via INTRAVENOUS
  Filled 2013-08-21: qty 2000

## 2013-08-21 NOTE — Progress Notes (Addendum)
Discussed with Dr Vassie LollAlva Pt event of non sustained 32 beat run of narrow complex tachycardia with rate of 210bpm. Strip placed in chart. Will continue to assess and monitor patient.

## 2013-08-21 NOTE — Progress Notes (Signed)
Patient ID: Leah [name], female   DOB: 1941/05/26, 73 y.o.   MRN: [id number]   Subjective: On vent, pressors.  Stable white count.    Objective:  Vital signs:  Filed Vitals:   08/21/13 0500 08/21/13 0600 08/21/13 0700 08/21/13 0800  BP: 1$Rem'05/32 98/49 95/47 'cLTQ$ 99/37  Pulse: 76 76 75 79  Temp:  96.3 F (35.7 C)  97.5 F (36.4 C)  TempSrc:  Oral  Oral  Resp: $Remo'18 19 22 17  'SgDdg$ Height:      Weight: 146 lb 9.7 oz (66.5 kg)     SpO2: 100% 100% 99% 98%    Last BM Date: 08/14/13  Intake/Output   Yesterday:  03/17 0701 - 03/18 0700 In: 4018.6 [I.V.:572.4; IV Piggyback:1343; TPN:2103.2] Out: 4493  This shift:  Total I/O In: 152.5 [I.V.:26.5; IV Piggyback:43; TPN:83] Out: 13 [Drains:13]   Physical Exam: General: on vent, sedated.  CRRT Abdomen: Soft.  Nondistended.  Well healed incision with staples.  eakin's pouch with adequate drainage.  LLQ ostomy is viable.    Problem List:   Active Problems:   Respiratory failure   Diverticulitis   Enterocolic fistula   Acute kidney injury   Hypervolemia   Pulmonary edema   Anasarca   Severe sepsis(995.92)   Shock   DNAR (do not attempt resuscitation)    Results:   Labs: Results for orders placed during the hospital encounter of 08/04/2013 (from the past 48 hour(s))  BILIRUBIN, FRACTIONATED(TOT/DIR/INDIR)     Status: Abnormal   Collection Time    08/19/13  9:30 AM      Result Value Ref Range   Total Bilirubin 7.9 (*) 0.3 - 1.2 mg/dL   Bilirubin, Direct 6.8 (*) 0.0 - 0.3 mg/dL   Indirect Bilirubin 1.1 (*) 0.3 - 0.9 mg/dL  CULTURE, BLOOD (ROUTINE X 2)     Status: None   Collection Time    08/19/13 11:30 AM      Result Value Ref Range   Specimen Description BLOOD LEFT HAND     Special Requests BOTTLES DRAWN AEROBIC ONLY 8CC     Culture  Setup Time       Value: 08/19/2013 14:00     Performed at [facility]   Culture       Value:        BLOOD CULTURE RECEIVED NO GROWTH TO DATE CULTURE WILL BE HELD FOR 5 DAYS BEFORE  ISSUING A FINAL NEGATIVE REPORT     Performed at [facility]   Report Status PENDING    GLUCOSE, CAPILLARY     Status: Abnormal   Collection Time    08/19/13 12:23 PM      Result Value Ref Range   Glucose-Capillary 139 (*) 70 - 99 mg/dL  GLUCOSE, CAPILLARY     Status: None   Collection Time    08/19/13  3:28 PM      Result Value Ref Range   Glucose-Capillary 98  70 - 99 mg/dL  RENAL FUNCTION PANEL     Status: Abnormal   Collection Time    08/19/13  4:31 PM      Result Value Ref Range   Sodium 130 (*) 137 - 147 mEq/L   Potassium 5.2  3.7 - 5.3 mEq/L   Chloride 95 (*) 96 - 112 mEq/L   CO2 25  19 - 32 mEq/L   Glucose, Bld 99  70 - 99 mg/dL   BUN 40 (*) 6 - 23 mg/dL   Creatinine, Ser  0.43 (*) 0.50 - 1.10 mg/dL   Calcium 8.6  8.4 - 10.5 mg/dL   Phosphorus 4.7 (*) 2.3 - 4.6 mg/dL   Albumin 1.5 (*) 3.5 - 5.2 g/dL   GFR calc non Af Amer >90  >90 mL/min   GFR calc Af Amer >90  >90 mL/min   Comment: (NOTE)     The eGFR has been calculated using the CKD EPI equation.     This calculation has not been validated in all clinical situations.     eGFR's persistently <90 mL/min signify possible Chronic Kidney     Disease.  GLUCOSE, CAPILLARY     Status: None   Collection Time    08/19/13  6:50 PM      Result Value Ref Range   Glucose-Capillary 96  70 - 99 mg/dL  GLUCOSE, CAPILLARY     Status: Abnormal   Collection Time    08/19/13 11:30 PM      Result Value Ref Range   Glucose-Capillary 143 (*) 70 - 99 mg/dL  GLUCOSE, CAPILLARY     Status: Abnormal   Collection Time    08/20/13  3:11 AM      Result Value Ref Range   Glucose-Capillary 122 (*) 70 - 99 mg/dL  COMPREHENSIVE METABOLIC PANEL     Status: Abnormal   Collection Time    08/20/13  5:08 AM      Result Value Ref Range   Sodium 135 (*) 137 - 147 mEq/L   Potassium 3.5 (*) 3.7 - 5.3 mEq/L   Comment: DELTA CHECK NOTED   Chloride 104  96 - 112 mEq/L   Comment: DELTA CHECK NOTED   CO2 20  19 - 32 mEq/L   Glucose, Bld  94  70 - 99 mg/dL   BUN 33 (*) 6 - 23 mg/dL   Creatinine, Ser 0.34 (*) 0.50 - 1.10 mg/dL   Calcium 6.8 (*) 8.4 - 10.5 mg/dL   Total Protein 4.4 (*) 6.0 - 8.3 g/dL   Albumin 1.2 (*) 3.5 - 5.2 g/dL   AST 294 (*) 0 - 37 U/L   ALT 89 (*) 0 - 35 U/L   Alkaline Phosphatase 284 (*) 39 - 117 U/L   Total Bilirubin 6.3 (*) 0.3 - 1.2 mg/dL   GFR calc non Af Amer >90  >90 mL/min   GFR calc Af Amer >90  >90 mL/min   Comment: (NOTE)     The eGFR has been calculated using the CKD EPI equation.     This calculation has not been validated in all clinical situations.     eGFR's persistently <90 mL/min signify possible Chronic Kidney     Disease.  CBC WITH DIFFERENTIAL     Status: Abnormal   Collection Time    08/20/13  5:08 AM      Result Value Ref Range   WBC 11.5 (*) 4.0 - 10.5 K/uL   RBC 2.12 (*) 3.87 - 5.11 MIL/uL   Hemoglobin 6.7 (*) 12.0 - 15.0 g/dL   Comment: CRITICAL RESULT CALLED TO, READ BACK BY AND VERIFIED WITH:     SPERRY,A RN 08/20/2013 0601 JORDANS     REPEATED TO VERIFY   HCT 21.3 (*) 36.0 - 46.0 %   MCV 100.5 (*) 78.0 - 100.0 fL   MCH 31.6  26.0 - 34.0 pg   MCHC 31.5  30.0 - 36.0 g/dL   RDW 24.9 (*) 11.5 - 15.5 %   Platelets 58 (*) 150 - 400 K/uL  Comment: CONSISTENT WITH PREVIOUS RESULT   Neutrophils Relative % 84 (*) 43 - 77 %   Lymphocytes Relative 7 (*) 12 - 46 %   Monocytes Relative 9  3 - 12 %   Eosinophils Relative 0  0 - 5 %   Basophils Relative 0  0 - 1 %   Neutro Abs 9.7 (*) 1.7 - 7.7 K/uL   Lymphs Abs 0.8  0.7 - 4.0 K/uL   Monocytes Absolute 1.0  0.1 - 1.0 K/uL   Eosinophils Absolute 0.0  0.0 - 0.7 K/uL   Basophils Absolute 0.0  0.0 - 0.1 K/uL   RBC Morphology POLYCHROMASIA PRESENT     Comment: RARE NRBCs   WBC Morphology TOXIC GRANULATION    MAGNESIUM     Status: None   Collection Time    08/20/13  5:08 AM      Result Value Ref Range   Magnesium 1.9  1.5 - 2.5 mg/dL  PHOSPHORUS     Status: Abnormal   Collection Time    08/20/13  5:08 AM      Result  Value Ref Range   Phosphorus 2.1 (*) 2.3 - 4.6 mg/dL  POCT I-STAT 3, BLOOD GAS (G3+)     Status: Abnormal   Collection Time    08/20/13  5:20 AM      Result Value Ref Range   pH, Arterial 7.366  7.350 - 7.450   pCO2 arterial 40.2  35.0 - 45.0 mmHg   pO2, Arterial 55.0 (*) 80.0 - 100.0 mmHg   Bicarbonate 23.2  20.0 - 24.0 mEq/L   TCO2 24  0 - 100 mmol/L   O2 Saturation 88.0     Acid-base deficit 2.0  0.0 - 2.0 mmol/L   Patient temperature 97.6 F     Collection site RADIAL, ALLEN'S TEST ACCEPTABLE     Drawn by RT     Sample type ARTERIAL    GLUCOSE, CAPILLARY     Status: Abnormal   Collection Time    08/20/13  8:25 AM      Result Value Ref Range   Glucose-Capillary 107 (*) 70 - 99 mg/dL  GLUCOSE, CAPILLARY     Status: Abnormal   Collection Time    08/20/13 11:33 AM      Result Value Ref Range   Glucose-Capillary 165 (*) 70 - 99 mg/dL  CBC     Status: Abnormal   Collection Time    08/20/13 12:54 PM      Result Value Ref Range   WBC 12.8 (*) 4.0 - 10.5 K/uL   RBC 2.51 (*) 3.87 - 5.11 MIL/uL   Hemoglobin 7.8 (*) 12.0 - 15.0 g/dL   HCT 25.7 (*) 36.0 - 46.0 %   MCV 102.4 (*) 78.0 - 100.0 fL   MCH 31.1  26.0 - 34.0 pg   MCHC 30.4  30.0 - 36.0 g/dL   RDW 25.0 (*) 11.5 - 15.5 %   Platelets 73 (*) 150 - 400 K/uL   Comment: CONSISTENT WITH PREVIOUS RESULT  RENAL FUNCTION PANEL     Status: Abnormal   Collection Time    08/20/13  1:00 PM      Result Value Ref Range   Sodium 131 (*) 137 - 147 mEq/L   Potassium 5.0  3.7 - 5.3 mEq/L   Chloride 97  96 - 112 mEq/L   CO2 24  19 - 32 mEq/L   Glucose, Bld 141 (*) 70 - 99 mg/dL   BUN  38 (*) 6 - 23 mg/dL   Creatinine, Ser 0.44 (*) 0.50 - 1.10 mg/dL   Calcium 7.7 (*) 8.4 - 10.5 mg/dL   Phosphorus 2.3  2.3 - 4.6 mg/dL   Albumin 1.4 (*) 3.5 - 5.2 g/dL   GFR calc non Af Amer >90  >90 mL/min   GFR calc Af Amer >90  >90 mL/min   Comment: (NOTE)     The eGFR has been calculated using the CKD EPI equation.     This calculation has not been  validated in all clinical situations.     eGFR's persistently <90 mL/min signify possible Chronic Kidney     Disease.  GLUCOSE, CAPILLARY     Status: Abnormal   Collection Time    08/20/13  3:19 PM      Result Value Ref Range   Glucose-Capillary 111 (*) 70 - 99 mg/dL  GLUCOSE, CAPILLARY     Status: Abnormal   Collection Time    08/20/13  7:23 PM      Result Value Ref Range   Glucose-Capillary 117 (*) 70 - 99 mg/dL  GLUCOSE, CAPILLARY     Status: Abnormal   Collection Time    08/20/13 11:34 PM      Result Value Ref Range   Glucose-Capillary 134 (*) 70 - 99 mg/dL   Comment 1 Notify RN    CBC     Status: Abnormal   Collection Time    08/21/13 12:34 AM      Result Value Ref Range   WBC 13.2 (*) 4.0 - 10.5 K/uL   RBC 2.67 (*) 3.87 - 5.11 MIL/uL   Hemoglobin 8.2 (*) 12.0 - 15.0 g/dL   HCT 27.1 (*) 36.0 - 46.0 %   MCV 101.5 (*) 78.0 - 100.0 fL   MCH 30.7  26.0 - 34.0 pg   MCHC 30.3  30.0 - 36.0 g/dL   RDW 24.9 (*) 11.5 - 15.5 %   Platelets 94 (*) 150 - 400 K/uL   Comment: CONSISTENT WITH PREVIOUS RESULT  GLUCOSE, CAPILLARY     Status: Abnormal   Collection Time    08/21/13  3:49 AM      Result Value Ref Range   Glucose-Capillary 105 (*) 70 - 99 mg/dL   Comment 1 Notify RN    COMPREHENSIVE METABOLIC PANEL     Status: Abnormal   Collection Time    08/21/13  4:20 AM      Result Value Ref Range   Sodium 129 (*) 137 - 147 mEq/L   Potassium 4.4  3.7 - 5.3 mEq/L   Chloride 95 (*) 96 - 112 mEq/L   CO2 22  19 - 32 mEq/L   Glucose, Bld 120 (*) 70 - 99 mg/dL   BUN 37 (*) 6 - 23 mg/dL   Creatinine, Ser 0.38 (*) 0.50 - 1.10 mg/dL   Calcium 8.2 (*) 8.4 - 10.5 mg/dL   Total Protein 5.7 (*) 6.0 - 8.3 g/dL   Albumin 1.6 (*) 3.5 - 5.2 g/dL   AST 386 (*) 0 - 37 U/L   ALT 128 (*) 0 - 35 U/L   Alkaline Phosphatase 364 (*) 39 - 117 U/L   Total Bilirubin 7.3 (*) 0.3 - 1.2 mg/dL   GFR calc non Af Amer >90  >90 mL/min   GFR calc Af Amer >90  >90 mL/min   Comment: (NOTE)     The eGFR has  been calculated using the CKD EPI equation.  This calculation has not been validated in all clinical situations.     eGFR's persistently <90 mL/min signify possible Chronic Kidney     Disease.  PHOSPHORUS     Status: Abnormal   Collection Time    08/21/13  4:20 AM      Result Value Ref Range   Phosphorus 1.8 (*) 2.3 - 4.6 mg/dL  MAGNESIUM     Status: None   Collection Time    08/21/13  4:20 AM      Result Value Ref Range   Magnesium 2.3  1.5 - 2.5 mg/dL  CBC     Status: Abnormal   Collection Time    08/21/13  4:20 AM      Result Value Ref Range   WBC 12.2 (*) 4.0 - 10.5 K/uL   RBC 2.63 (*) 3.87 - 5.11 MIL/uL   Hemoglobin 8.3 (*) 12.0 - 15.0 g/dL   HCT 26.9 (*) 36.0 - 46.0 %   MCV 102.3 (*) 78.0 - 100.0 fL   MCH 31.6  26.0 - 34.0 pg   MCHC 30.9  30.0 - 36.0 g/dL   RDW 24.7 (*) 11.5 - 15.5 %   Platelets 95 (*) 150 - 400 K/uL   Comment: CONSISTENT WITH PREVIOUS RESULT    Imaging / Studies: Dg Chest Port 1 View  08/21/2013   CLINICAL DATA:  Hypoxia  EXAM: PORTABLE CHEST - 1 VIEW  COMPARISON:  August 20, 2013  FINDINGS: Endotracheal tube tip is 4.3 cm above the carina. Right jugular catheter tip is in the superior vena cava near the cavoatrial junction. Left central catheter tip is at the junction of the left innominate artery and superior vena cava. Nasogastric tube tip and side port are below the diaphragm. No pneumothorax. There remains extensive interstitial and patchy alveolar edema bilaterally, stable. Heart is enlarged with pulmonary venous hypertension. Patient is status post coronary artery bypass grafting.  IMPRESSION: Cardiomegaly and pulmonary venous hypertension with interstitial and alveolar edema. These are findings consistent with congestive heart failure. A degree of underlying ARDS cannot be excluded radiographically. Tube and catheter positions as described without pneumothorax.   Electronically Signed   By: Lowella Grip M.D.   On: 08/21/2013 07:21   Dg Chest  Port 1 View  08/20/2013   CLINICAL DATA:  Respiratory failure  EXAM: PORTABLE CHEST - 1 VIEW  COMPARISON:  DG CHEST 1V PORT dated 08/19/2013  FINDINGS: Again demonstrated are bilateral interstitial and confluent alveolar infiltrates. The left hemidiaphragm remains obscured. The cardiopericardial silhouette where visualized is top-normal in size and stable. The patient has undergone previous CABG. The pulmonary vascularity is indistinct. There is no pneumothorax or pneumomediastinum.  The tracheostomy appliance tip is stable lying at the level of the inferior margin of the clavicular heads. The esophagogastric tube tip and proximal port project off the inferior margin of the film. The right and left internal jugular venous catheters appear stable in position.  IMPRESSION: The appearance of the chest has slightly deteriorated since the previous study with increased conspicuity of bilateral airspace disease. While pneumonia and CHF are certainly in the differential, ARDS may well be developing.   Electronically Signed   By: David  Martinique   On: 08/20/2013 07:57    Medications / Allergies: per chart  Antibiotics: Anti-infectives   Start     Dose/Rate Route Frequency Ordered Stop   08/19/13 1000  linezolid (ZYVOX) IVPB 600 mg     600 mg 300 mL/hr over 60 Minutes Intravenous Every  12 hours 08/19/13 0842     08/15/13 1200  micafungin (MYCAMINE) 100 mg in sodium chloride 0.9 % 100 mL IVPB     100 mg 100 mL/hr over 1 Hours Intravenous Daily 08/15/13 1134     08/10/13 0600  imipenem-cilastatin (PRIMAXIN) 250 mg in sodium chloride 0.9 % 100 mL IVPB     250 mg 200 mL/hr over 30 Minutes Intravenous 3 times per day 08/09/13 0021     08/09/13 1000  imipenem-cilastatin (PRIMAXIN) 250 mg in sodium chloride 0.9 % 100 mL IVPB  Status:  Discontinued     250 mg 200 mL/hr over 30 Minutes Intravenous Every 12 hours 08/09/13 0019 08/09/13 0021   08/08/13 1000  micafungin (MYCAMINE) 100 mg in sodium chloride 0.9 % 100 mL  IVPB  Status:  Discontinued     100 mg 100 mL/hr over 1 Hours Intravenous Daily 08/08/13 0950 08/08/13 1001   08/04/2013 1800  vancomycin (VANCOCIN) IVPB 1000 mg/200 mL premix  Status:  Discontinued     1,000 mg 200 mL/hr over 60 Minutes Intravenous Every 24 hours 09/03/2013 1625 08/11/13 1057   08/17/2013 1700  imipenem-cilastatin (PRIMAXIN) 250 mg in sodium chloride 0.9 % 100 mL IVPB  Status:  Discontinued     250 mg 200 mL/hr over 30 Minutes Intravenous 3 times per day 08/11/2013 1624 08/09/13 0019      Assessment/Plan  Acute on chronic respiratory failure PNA Septic shock CHF ARF  S/p Hartman's procedure at [facility] complicated by [facility] fistula PCM -TPN -fistula controlled with Eakin's pouch -WOC consult for Eakin's pouch change/teaching -DC staples -Ostomy care -would scan if inotropic support goes up or increasing WBC  Erby Pian, ANP-BC [facility] Pager [phone] Office [phone]  08/21/2013 8:07 AM

## 2013-08-21 NOTE — Progress Notes (Signed)
ANTIBIOTIC CONSULT NOTE - FOLLOW UP  Pharmacy Consult for Imipenem Indication: intraabdominal infection  Allergies  Allergen Reactions  . Cephalexin Other (See Comments)    Unknown  . Codeine Nausea And Vomiting  Patient Measurements: Height: 5\' 1"  (154.9 cm) Weight: 146 lb 9.7 oz (66.5 kg) IBW/kg (Calculated) : 47.8 Vital Signs: Temp: 97.4 F (36.3 C) (03/18 1306) Temp src: Oral (03/18 1306) BP: 114/47 mmHg (03/18 1245) Pulse Rate: 94 (03/18 1245) Intake/Output from previous day: 03/17 0701 - 03/18 0700 In: 4018.6 [I.V.:572.4; IV Piggyback:1343; TPN:2103.2] Out: 4493  Intake/Output from this shift: Total I/O In: 1291.8 [I.V.:178.8; IV Piggyback:615; TPN:498] Out: 1589 [Emesis/NG output:50; Drains:58; Other:1481] Labs:  Recent Labs  08/20/13 0508  08/20/13 1300 08/21/13 0034 08/21/13 0420 08/21/13 1230  WBC 11.5*  < >  --  13.2* 12.2* 11.5*  HGB 6.7*  < >  --  8.2* 8.3* 8.6*  PLT 58*  < >  --  94* 95* 103*  CREATININE 0.34*  --  0.44*  --  0.38*  --   < > = values in this interval not displayed. Estimated Creatinine Clearance: 56.3 ml/min (by C-G formula based on Cr of 0.38). No results found for this basename: VANCOTROUGH, Leodis BinetVANCOPEAK, VANCORANDOM, GENTTROUGH, GENTPEAK, GENTRANDOM, TOBRATROUGH, TOBRAPEAK, TOBRARND, AMIKACINPEAK, AMIKACINTROU, AMIKACIN,  in the last 72 hours   Microbiology: Recent Results (from the past 720 hour(s))  MRSA PCR SCREENING     Status: None   Collection Time    08/17/2013 11:24 AM      Result Value Ref Range Status   MRSA by PCR NEGATIVE  NEGATIVE Final   Comment:            The GeneXpert MRSA Assay (FDA     approved for NASAL specimens     only), is one component of a     comprehensive MRSA colonization     surveillance program. It is not     intended to diagnose MRSA     infection nor to guide or     monitor treatment for     MRSA infections.  URINE CULTURE     Status: None   Collection Time    08/29/2013 11:26 AM      Result  Value Ref Range Status   Specimen Description URINE, CATHETERIZED   Final   Special Requests Normal   Final   Culture  Setup Time     Final   Value: 08/31/2013 12:02     [facility]formed [facility]   Colony Count     Final   Value: >=100,000 COLONIES/ML     [facility]formed at [facility]   Culture     Final   Value: Multiple bacterial morphotypes present, none predominant. Suggest appropriate recollection if clinically indicated.     Performed at [facility]   Report Status 08/08/2013 FINAL   Final  CULTURE, BLOOD (ROUTINE X 2)     Status: None   Collection Time    09/02/2013  4:20 PM      Result Value Ref Range Status   Specimen Description BLOOD   Final   Special Requests     Final   Value: BOTTLES DRAWN AEROBIC AND ANAEROBIC 10CC LEFT IJ CVC   Culture  Setup Time     Final   Value: 08/06/2013 20:59     Performed at [facility]   Culture     Final   Value: NO GROWTH 5 DAYS  Performed at [facility]   Report Status 08/13/2013 FINAL   Final  CULTURE, BLOOD (ROUTINE X 2)     Status: None   Collection Time    08/19/13 11:30 AM      Result Value Ref Range Status   Specimen Description BLOOD LEFT HAND   Final   Special Requests BOTTLES DRAWN AEROBIC ONLY 8CC   Final   Culture  Setup Time     Final   Value: 08/19/2013 14:00     Performed at [facility]   Culture     Final   Value:        BLOOD CULTURE RECEIVED NO GROWTH TO DATE CULTURE WILL BE HELD FOR 5 DAYS BEFORE ISSUING A FINAL NEGATIVE REPORT     Performed at [facility]   Report Status PENDING   Incomplete    Anti-infectives   Start     Dose/Rate Route Frequency Ordered Stop   08/19/13 1000  linezolid (ZYVOX) IVPB 600 mg     600 mg 300 mL/hr over 60 Minutes Intravenous Every 12 hours 08/19/13 0842     08/15/13 1200  micafungin (MYCAMINE) 100 mg in sodium chloride 0.9 % 100 mL IVPB     100 mg 100 mL/hr over 1 Hours Intravenous Daily 08/15/13 1134     08/10/13  0600  imipenem-cilastatin (PRIMAXIN) 250 mg in sodium chloride 0.9 % 100 mL IVPB     250 mg 200 mL/hr over 30 Minutes Intravenous 3 times per day 08/09/13 0021     08/09/13 1000  imipenem-cilastatin (PRIMAXIN) 250 mg in sodium chloride 0.9 % 100 mL IVPB  Status:  Discontinued     250 mg 200 mL/hr over 30 Minutes Intravenous Every 12 hours 08/09/13 0019 08/09/13 0021   08/08/13 1000  micafungin (MYCAMINE) 100 mg in sodium chloride 0.9 % 100 mL IVPB  Status:  Discontinued     100 mg 100 mL/hr over 1 Hours Intravenous Daily 08/08/13 0950 08/08/13 1001   2013/09/01 1800  vancomycin (VANCOCIN) IVPB 1000 mg/200 mL premix  Status:  Discontinued     1,000 mg 200 mL/hr over 60 Minutes Intravenous Every 24 hours Sep 01, 2013 1625 08/11/13 1057   01-Sep-2013 1700  imipenem-cilastatin (PRIMAXIN) 250 mg in sodium chloride 0.9 % 100 mL IVPB  Status:  Discontinued     250 mg 200 mL/hr over 30 Minutes Intravenous 3 times per day 09-01-2013 1624 08/09/13 0019     Assessment: 73 year old female who continues on Primaxin (Day #15) empiric for intraabdominal infection and Zyvox d3 for r/o VRE. Pt also on Micafungin Day #7 for empiric fungal coverage. s/p Vanc for enterococcus UTI. Afebrile. WBC 11.5- stable.  3/13 PCT 7.95. Her antibiotic dose is appropriate for CRRT.  She is tolerating CRRT at this time.  Goal of Therapy:  Treatment of infection  Plan:  1) Continue Imipenem 250 mg IV q8h. Length of therapy? 2) F/u CRRT tolerance and plans for transition off. 3) F/u family discussions   Link Snuffer, PharmD, BCPS Clinical Pharmacist [phone] 08/21/2013, 1:41 PM

## 2013-08-21 NOTE — Progress Notes (Signed)
Patient ID: Leah [name], female   DOB: 02/17/1941, 73 y.o.   MRN: [id number]  [facility] Progress Note    Assessment/ Plan:   1 Anuric AKI: From ischemic/sepsis associated ATN-- continue CRRT at this time given high daily fluid load from TPN and catabolic rate from sepsis. Attempting UF of about 3150mL/hr (challenging as she remains on dual pressors).  2. Status post Hartman's procedure at [facility] complicated by small bowel fistula, enterocutaneous fistula and sepsis-with septic shock and on broad-spectrum antibiotics. Still with difficulty to wean pressors. Plans noted for CT abdomen if leukocytosis worsens or drainage changes 3 Volume overload: Volume overloaded with difficulty in ultrafiltration due to ongoing pressor needs.  4 Hyponatremia:From hypotonic TPN and due to limitations with UF   5 Hypophosphatemia: pharmacy to add electrolyte mix to TPN after replacement this AM   Subjective:   No acute events overnight   Objective:   BP 98/49  Pulse 76  Temp(Src) 96.3 F (35.7 C) (Oral)  Resp 19  Ht 5\' 1"  (1.549 m)  Wt 66.5 kg (146 lb 9.7 oz)  BMI 27.72 kg/m2  SpO2 100%  Intake/Output Summary (Last 24 hours) at 08/21/13 0720 Last data filed at 08/21/13 0700  Gross per 24 hour  Intake 4018.59 ml  Output   4396 ml  Net -377.41 ml   Weight change: -0.2 kg (-7.1 oz)  Physical Exam: WUJ:WJXBJGen:awake and tries to turn to localize voice, on vent via trach YNW:GNFAOCVS:Pulse RRR, s1 and S2 with ESM Resp:Coarse BS bilaterally, no rales ZHY:QMVHAbd:soft, open wound lower abdomen with serous drainage Ext:2+ LE/UE edema  Imaging: Dg Chest Port 1 View  08/20/2013   CLINICAL DATA:  Respiratory failure  EXAM: PORTABLE CHEST - 1 VIEW  COMPARISON:  DG CHEST 1V PORT dated 08/19/2013  FINDINGS: Again demonstrated are bilateral interstitial and confluent alveolar infiltrates. The left hemidiaphragm remains obscured. The cardiopericardial silhouette where visualized is top-normal in size and  stable. The patient has undergone previous CABG. The pulmonary vascularity is indistinct. There is no pneumothorax or pneumomediastinum.  The tracheostomy appliance tip is stable lying at the level of the inferior margin of the clavicular heads. The esophagogastric tube tip and proximal port project off the inferior margin of the film. The right and left internal jugular venous catheters appear stable in position.  IMPRESSION: The appearance of the chest has slightly deteriorated since the previous study with increased conspicuity of bilateral airspace disease. While pneumonia and CHF are certainly in the differential, ARDS may well be developing.   Electronically Signed   By: David  [place]azil[place]   On: 08/20/2013 07:57    Labs: BMET  Recent Labs Lab 08/18/13 0510 08/18/13 1600 08/19/13 0516 08/19/13 1631 08/20/13 0508 08/20/13 1300 08/21/13 0420  NA 126* 128* 129* 130* 135* 131* 129*  K 4.9 4.9 5.0 5.2 3.5* 5.0 4.4  CL 93* 95* 95* 95* 104 97 95*  CO2 21 22 24 25 20 24 22   GLUCOSE 151* 141* 116* 99 94 141* 120*  BUN 32* 34* 37* 40* 33* 38* 37*  CREATININE 0.35* 0.39* 0.40* 0.43* 0.34* 0.44* 0.38*  CALCIUM 8.1* 8.2* 8.5 8.6 6.8* 7.7* 8.2*  PHOS 2.7 3.4 3.6 4.7* 2.1* 2.3 1.8*   CBC  Recent Labs Lab 08/17/13 0239 08/18/13 0510 08/19/13 0516 08/20/13 0508 08/20/13 1254 08/21/13 0034 08/21/13 0420  WBC 19.2* 16.4* 16.0* 11.5* 12.8* 13.2* 12.2*  NEUTROABS 16.3* 14.9* 14.1* 9.7*  --   --   --   HGB  6.5* 8.3* 8.2* 6.7* 7.8* 8.2* 8.3*  HCT 20.3* 25.7* 25.7* 21.3* 25.7* 27.1* 26.9*  MCV 97.6 95.2 97.3 100.5* 102.4* 101.5* 102.3*  PLT 53* 48* 59* 58* 73* 94* 95*    Medications:    . antiseptic oral rinse  15 mL Mouth Rinse QID  . chlorhexidine  15 mL Mouth Rinse BID  . hydrocortisone sod succinate (SOLU-CORTEF) inj  50 mg Intravenous Q6H  . imipenem-cilastatin  250 mg Intravenous 3 times per day  . insulin aspart  0-15 Units Subcutaneous 6 times per day  . linezolid  600 mg  Intravenous Q12H  . micafungin (MYCAMINE) IV  100 mg Intravenous Daily  . pantoprazole (PROTONIX) IV  40 mg Intravenous Q24H  . sodium chloride  2,000 mL Intravenous Once  . sodium phosphate  Dextrose 5% IVPB  30 mmol Intravenous Once   Zetta Bills, MD 08/21/2013, 7:20 AM

## 2013-08-21 NOTE — Progress Notes (Signed)
PARENTERAL NUTRITION CONSULT NOTE:  FOLLOW-UP  Pharmacy Consult:  TPN Indication: Suspect TF intolerance d/t to severe bowel edema  Allergies  Allergen Reactions  . Cephalexin Other (See Comments)    Unknown  . Codeine Nausea And Vomiting    Patient Measurements: Height: 5\' 1"  (154.9 cm) Weight: 146 lb 9.7 oz (66.5 kg) IBW/kg (Calculated) : 47.8  Usual Weight: 55 kg (this was admit weight at [facility] 2/13)  Vital Signs: Temp: 97.5 F (36.4 C) (03/18 0800) Temp src: Oral (03/18 0800) BP: 99/37 mmHg (03/18 0800) Pulse Rate: 79 (03/18 0800) Intake/Output from previous day: 03/17 0701 - 03/18 0700 In: 4018.6 [I.V.:572.4; IV Piggyback:1343; TPN:2103.2] Out: 4493   Labs:  Recent Labs  08/20/13 1254 08/21/13 0034 08/21/13 0420  WBC 12.8* 13.2* 12.2*  HGB 7.8* 8.2* 8.3*  HCT 25.7* 27.1* 26.9*  PLT 73* 94* 95*     Recent Labs  08/19/13 0516 08/19/13 0930  08/20/13 0508 08/20/13 1300 08/21/13 0420  NA 129*  --   < > 135* 131* 129*  K 5.0  --   < > 3.5* 5.0 4.4  CL 95*  --   < > 104 97 95*  CO2 24  --   < > 20 24 22   GLUCOSE 116*  --   < > 94 141* 120*  BUN 37*  --   < > 33* 38* 37*  CREATININE 0.40*  --   < > 0.34* 0.44* 0.38*  CALCIUM 8.5  --   < > 6.8* 7.7* 8.2*  MG 2.5  --   --  1.9  --  2.3  PHOS 3.6  --   < > 2.1* 2.3 1.8*  PROT 5.5*  --   --  4.4*  --  5.7*  ALBUMIN 1.5*  --   < > 1.2* 1.4* 1.6*  AST 258*  --   --  294*  --  386*  ALT 77*  --   --  89*  --  128*  ALKPHOS 326*  --   --  284*  --  364*  BILITOT 7.9* 7.9*  --  6.3*  --  7.3*  BILIDIR  --  6.8*  --   --   --   --   IBILI  --  1.1*  --   --   --   --   PREALBUMIN 9.6*  --   --   --   --   --   TRIG 139  --   --   --   --   --   < > = values in this interval not displayed. Estimated Creatinine Clearance: 56.3 ml/min (by C-G formula based on Cr of 0.38).    Recent Labs  08/20/13 1923 08/20/13 2334 08/21/13 0349  GLUCAP 117* 134* 105*    Insulin Requirements in the past 24 hours:   5 units moderate SSI + 80 units regular insulin in TPN with CBGs 105-141  Current Nutrition:  Clinimix NO LYTES 5/15 at 81ml/hr + Lipids 70ml/hr on Mon/Fri (decrease to 2d per week to prevent essential fatty acid deficiency) provides an average of 1716 kCal and 100gm protein daily.  Nutritional Goals:  1393 kCal (goal for permissive underfeeding ~1200 kCal); protein 115 gm per day- RD note 3/11  Assessment: 5 YOF admitted 07/19/13 to [facility] for planned repair of enterocolonic fistula. Post-op course complicated by ileus, renal failure, and cardiopulmonary arrest.  Patient transferred to [facility] on 09/01/2013. +CVVHD.  GI: bowels are too  edematous to tolerate TF. Persistent ECF- controlled with Eakin's pouch, CCS not taking for resection. Baseline prealbumin was 15.6, decreased to 12.3, decreased further this week to 9.6. TG 139. NG output 186mL/24h, Drains output 70mL/24h charted. Abdomen with open wound and bilious significant drainage from fistula- wound care seeing. Last BM charted 3/11  Endo: no hx DM however requiring significant amounts of insulin, no A1C on file. CBGs are better controlled with increase in insulin in TPN bag. Noted stress dose steroids were started 3/15- to continue until able to wean off pressors  Lytes: Na 129, K 4.4, Phos 1.8- repleting with 2mmol NaPhos per CCM orders,Mag 2.3, CorCa 10.1 (Ca x Phos product = 18); renal notes to add lytes back to TPN with today's bag  Renal: hx CKD- CRRT since 08/08/13 for volume regulation. SCr/BUN stable. Still with increased weight with diffuse anasarca (55kg at [place], initial weight at [facility] was 86kg, now at 66.5kg).  NS at [facility].  Still anuric. TPN providing 2 g/kg/day of protein while on CRRT- also provides ~2L fluid per day.   Pulm: intubated at [facility] - FiO2 30% (stable). Now trached 3/11 remains on vent support.  Cards: AoC dHF (EF 35-40%), s/p cardiac arrest at [facility] 2/22.  Still requiring Levophed and vasopressin- BP remains  soft, HR ok, but can become tachy.   Hepatobil: no LFTs are WNL- AST increased again to 386, ALT increase to 128, Tbili increased to 7.3, alk phos increased to 364, albumin still low at 1.6  Neuro: GCS 12, RASS -1- only on PRN fentanyl which has not been given for a few days  Heme: Hgb dropped to 8.3, plts 95. No overt bleeding noted. HIT panel was sent 3/16  ID: Primaxin/Micafungin/Zyvox for intra-abd infxn + Enterococcus UTI (suspect VRE), WBC 12.2, currently afebrile   Best Practices: PPI IV, MC, SCDs on patient.  TPN Access: CVC placed 08/30/2013  TPN day#: PTA from [place] since 2/27   Plan:  1. Change to Clinimix E 5/15 and continue at 77ml/hr + lipids at 10 ml/hr on Mon/Fri only to reduce total kcal. While she is receiving a higher number of kcal than RD recommendations for permissive underfeeding, she requires higher amounts of protein d/t wound as well as being on CRRT. Unable to concentrate protein with premixed Clinimix bags. 2. Continue daily IV Mulitvitamin in TPN. Since Tbili is still >6, keep trace elements out of bag- add back zinc $RemoveB'5mg'vkFlHrYv$  and selenium 20mcg on MWF. Unable to add chromium as per protocol d/t shortage. 3. Continue 80 units regular insulin in TPN 4. Continue moderate scale SSI per MD 5. Conitnue NS KVO 6. TPN labs as ordered   Keon Benscoter D. Alejandra Hunt, PharmD, BCPS Clinical Pharmacist Pager: [phone] 08/21/2013 8:16 AM

## 2013-08-21 NOTE — Progress Notes (Signed)
PULMONARY / CRITICAL CARE MEDICINE   Name: ZYNIAH FERRAIOLO MRN: [id number] DOB: [birth year]  LOS 14 days    ADMISSION DATE:  09/02/2013  REFERRING MD :  [facility]  PRIMARY SERVICE: PCCM  CHIEF COMPLAINT:  Respiratory Failure   BRIEF PATIENT DESCRIPTION: 73 y/o admitted to [facility] s/p Hartman's procedure at [facility] complicated by SB fistula, now with sepsis and enterocutaneous fistula. .  Course was complicated by ileus, renal failure, cardiopulmonary arrest.  Transferred to [facility] on 3/4 for further care.   LINES / TUBES: R IJ TLC 2/14 >> 3/04 ETT([place])  2/23 >> 08/14/13, 3/11 ([place]) >> R IJ HD cath 3/04 >>  L IJ TLC 3/04 >>  ? Date aline >.3/15  CULTURES: ?date [facility]) >>> ENTEROCOCCUS FAECALIS 3/4 MRSA PCR >>> neg 3/4 Urine >>>  Multiple bacterial morphotypes  3/5 Blood >>  Neg 3/16 BC>>>  ANTIBIOTICS: Vancomycin 3/4 >> 3/08 Imipenem 3/4 >>> Mycafungin 3/12 >>> linazolid 3/16 (continued high pressors, high risk VRE, plat count noted)>>>  SIGNIFICANT EVENTS / STUDIES:  2/13  OR >>> Ex-lap with small bowel resection, extensive lysis of adhesions, vomiting 2/22  Cardiac arrest - VDRF/Intubated 08/11/2013 - ADMIT [facility] 3/05  TTE: EF 35-40, grade 2 diastolic dysfunction  3/05  CVVHD started 3/8 - No new issues. Tolerates PS 10 cm H2O 08/12/13: 40# off since [facility] with CR[facility]. Still anuric. Patient is DNAR but full medical care. On levophed .. Family at bedside 08/13/13: No sedation but on levophed . RN says occ opens eyes but unresponsive to this MD (last fentanyl was 1aM) 08/14/13: For Trach today. WC upa at 40; worsening LLQ enteric fistula iwtih collection on CT; high risk for re-exploration per CCS. Still on levophed at . On CRRT. Is overbreathing vent; opens eyes 3/16- remains with high pressor needs 3/17 pressors are weaning down   SUBJECTIVE/OVERNIGHT/INTERVAL HX Remains pressor dependent and re  increased  VITAL SIGNS: Temp:  [94.3 F (34.6 C)-98.2 F (36.8 C)] 97.4 F (36.3 C) (03/18 1306) Pulse Rate:  [63-99] 94 (03/18 1245) Resp:  [14-32] 19 (03/18 1245) BP: (87-118)/(32-75) 114/47 mmHg (03/18 1245) SpO2:  [97 %-100 %] 99 % (03/18 1245) FiO2 (%):  [40 %] 40 % (03/18 1300) Weight:  [66.5 kg (146 lb 9.7 oz)] 66.5 kg (146 lb 9.7 oz) (03/18 0500)  HEMODYNAMICS: CVP:  [3 mmHg-9 mmHg] 3 mmHg VENTILATOR SETTINGS: Vent Mode:  [-] PRVC FiO2 (%):  [40 %] 40 % Set Rate:  [16 bmp] 16 bmp Vt Set:  [380 mL] 380 mL PEEP:  [5 cmH20] 5 cmH20 Pressure Support:  [14 cmH20] 14 cmH20 Plateau Pressure:  [12 cmH20-22 cmH20] 20 cmH20  INTAKE / OUTPUT: Intake/Output     03/17 0701 - 03/18 0700 03/18 0701 - 03/19 0700   I.V. (mL/kg) 572.4 (8.6) 178.8 (2.7)   NG/GT     IV Piggyback 1343 615   TPN 2103.2 498   Total Intake(mL/kg) 4018.6 (60.4) 1291.8 (19.4)   Emesis/NG output 0 50   Drains 0 58   Other 4493 1481   Total Output 4493 1589   Net -474.4 -297.2         PHYSICAL EXAMINATION: General:  Very deconditioned. Critically ill looking Neuro: Diffusely weak, rass 1 HEENT: WNL Cardiovascular:  RRR s M Lungs: coarse, diminished, ronchi Abdomen:  BS absent, LLQ colostomy, RLQ fistula draining serous fluid, open wound Ext:  Edema noted  PULMONARY  Recent Labs Lab 08/16/13 1113 08/20/13 0520  PHART  --  7.366  PCO2ART  --  40.2  PO2ART  --  55.0*  HCO3  --  23.2  TCO2  --  24  O2SAT 61.9 88.0    CBC  Recent Labs Lab 08/21/13 0034 08/21/13 0420 08/21/13 1230  HGB 8.2* 8.3* 8.6*  HCT 27.1* 26.9* 26.9*  WBC 13.2* 12.2* 11.5*  PLT 94* 95* 103*    COAGULATION No results found for this basename: INR,  in the last 168 hours  CARDIAC    Recent Labs Lab 08/15/13 1118 08/16/13 0442 08/16/13 1200 08/16/13 1838 08/17/13 0238  TROPONINI <0.30 0.55* 0.62* 0.54* 0.79*   No results found for this basename: PROBNP,  in the last 168 hours   CHEMISTRY  Recent  Labs Lab 08/17/13 0239 08/18/13 0510  08/19/13 0516 08/19/13 1631 08/20/13 0508 08/20/13 1300 08/21/13 0420  NA 128* 126*  < > 129* 130* 135* 131* 129*  K 4.6 4.9  < > 5.0 5.2 3.5* 5.0 4.4  CL 94* 93*  < > 95* 95* 104 97 95*  CO2 20 21  < > 24 25 20 24 22   GLUCOSE 200* 151*  < > 116* 99 94 141* 120*  BUN 31* 32*  < > 37* 40* 33* 38* 37*  CREATININE 0.39* 0.35*  < > 0.40* 0.43* 0.34* 0.44* 0.38*  CALCIUM 7.9* 8.1*  < > 8.5 8.6 6.8* 7.7* 8.2*  MG 2.4 2.2  --  2.5  --  1.9  --  2.3  PHOS 2.8 2.7  < > 3.6 4.7* 2.1* 2.3 1.8*  < > = values in this interval not displayed. Estimated Creatinine Clearance: 56.3 ml/min (by C-G formula based on Cr of 0.38).   LIVER  Recent Labs Lab 08/16/13 0442  08/19/13 0516 08/19/13 0930 08/19/13 1631 08/20/13 0508 08/20/13 1300 08/21/13 0420  AST 164*  --  258*  --   --  294*  --  386*  ALT 75*  --  77*  --   --  89*  --  128*  ALKPHOS 199*  --  326*  --   --  284*  --  364*  BILITOT 4.8*  --  7.9* 7.9*  --  6.3*  --  7.3*  PROT 4.8*  --  5.5*  --   --  4.4*  --  5.7*  ALBUMIN 1.4*  < > 1.5*  --  1.5* 1.2* 1.4* 1.6*  < > = values in this interval not displayed.   INFECTIOUS  Recent Labs Lab 08/15/13 0440 08/15/13 1117 08/16/13 0442  LATICACIDVEN  --  2.8*  --   PROCALCITON 4.91  --  7.95     ENDOCRINE CBG (last 3)   Recent Labs  08/21/13 0349 08/21/13 0749 08/21/13 1204  GLUCAP 105* 110* 140*    IMAGING x48h  Dg Chest Port 1 View  08/21/2013   CLINICAL DATA:  Hypoxia  EXAM: PORTABLE CHEST - 1 VIEW  COMPARISON:  August 20, 2013  FINDINGS: Endotracheal tube tip is 4.3 cm above the carina. Right jugular catheter tip is in the superior vena cava near the cavoatrial junction. Left central catheter tip is at the junction of the left innominate artery and superior vena cava. Nasogastric tube tip and side port are below the diaphragm. No pneumothorax. There remains extensive interstitial and patchy alveolar edema bilaterally,  stable. Heart is enlarged with pulmonary venous hypertension. Patient is status post coronary artery bypass grafting.  IMPRESSION: Cardiomegaly and pulmonary venous hypertension  with interstitial and alveolar edema. These are findings consistent with congestive heart failure. A degree of underlying ARDS cannot be excluded radiographically. Tube and catheter positions as described without pneumothorax.   Electronically Signed   By: Bretta Bang M.D.   On: 08/21/2013 07:21   Dg Chest Port 1 View  08/20/2013   CLINICAL DATA:  Respiratory failure  EXAM: PORTABLE CHEST - 1 VIEW  COMPARISON:  DG CHEST 1V PORT dated 08/19/2013  FINDINGS: Again demonstrated are bilateral interstitial and confluent alveolar infiltrates. The left hemidiaphragm remains obscured. The cardiopericardial silhouette where visualized is top-normal in size and stable. The patient has undergone previous CABG. The pulmonary vascularity is indistinct. There is no pneumothorax or pneumomediastinum.  The tracheostomy appliance tip is stable lying at the level of the inferior margin of the clavicular heads. The esophagogastric tube tip and proximal port project off the inferior margin of the film. The right and left internal jugular venous catheters appear stable in position.  IMPRESSION: The appearance of the chest has slightly deteriorated since the previous study with increased conspicuity of bilateral airspace disease. While pneumonia and CHF are certainly in the differential, ARDS may well be developing.   Electronically Signed   By: David  [place]   On: 08/20/2013 07:57    ASSESSMENT / PLAN:  PULMONARY A: Acute and chronic respiratory failure with prolonged mech vent/chronic critical illness  - s/p trach 08/14/13 COPD without exacerbation LLL PNA on CT abd 08/13/13  - Failure to wean due to severe physical deconditioning, septic shock and poor mental status P:   Remains on low O2 needs Keep same MV when on rest Wean PS 10 goal pcxr  in am  If hemodynamics allow, want neg balance  CARDIOVASCULAR A:  Septic shock Cardiogenic shock S/p Cardiopulmonary arrest Acute on chronic systolic and diastolic CHF NSTEMI 2/23 [facility])  P:  Levophed to MAP goal 60, re increased maintain vaso for now until at 5 mics levo maintain stress steroids at 50mg  q 6 h until off pressors May improved hemodyncmis even with neg balance  RENAL A:   ARF, hyponatremia  P:   CRRT per Nephrology Consider  Can we increase neg balance  GASTROINTESTINAL A:  S/p HArtmans procedure Enterocolonic fistula  Protein calorie malnutrition Cholestasis confirmed by bili  P:   SUP: IV PPI Cont TPN Surgery following CCS says ct abd if pressor needs increase. Now to 17 mics, will order, r/o abscess that we could IR  HEMATOLOGIC A:   Anemia s/p PRBC 08/17/13 Mild thrombocytopenia, extensive hep exposure Plt 103 P:  DVT px: SCDs Ensure HIT pending Cont linazolid cb cin am   INFECTIOUS A:   Septic shock persists in setting of complicated GI surgery UTI P:   Continued high pressor needs CT abdo /pelvis / chest Continue  linazolid 3/16, high risk VRE Repeat BC - neg UA unable , no urine noyed Continue imi, caspo empiric  ENDOCRINE A:   Hyperglycemia 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  CT head negative for acute abnormalities  P:   Goal RASS 0 to -1 Cont PRN fentanyl Repeat head CT  I have fully examined this patient and agree with above findings.    And edited in full  Ccm time 35 min Will rediscuss consideration comfort  Mcarthur Rossetti. Tyson Alias, MD, FACP Pgr: [phone] [place] [facility]

## 2013-08-21 NOTE — Progress Notes (Signed)
If continued aggressive therapy desired,  CT scan may be necessary given increasing pressor support.  Doubt she will survive this.

## 2013-08-21 NOTE — Progress Notes (Signed)
Participated in discussion with Dr. Tyson AliasFeinstein and Pt husband after assessment today.  Dr. Tyson AliasFeinstein provided update and offered to answer the husbands questions at this time.  Discussed plan of care to obtain CT scans today and have another discussion tomorrow or Friday.  Focus on comfort care only was talked about during these discussions and the information obtained in the CT scans will help determine where plan to go.  Support offered to husband and chaplin services declined at this time.    Jacqulyn Canehristopher Scott Eleonor Ocon RN, BSN, CCRN

## 2013-08-21 NOTE — Progress Notes (Signed)
eLink Physician-Brief Progress Note Patient Name: Leah FurlongSandra K [name] DOB: 11/20/1940 MRN: [id number]  Date of Service  08/21/2013   HPI/Events of Note  hypophosphatemia   eICU Interventions  Phos replaced   Intervention Category Intermediate Interventions: Electrolyte abnormality - evaluation and management  Leah [name] 08/21/2013, 5:42 AM

## 2013-08-21 NOTE — Progress Notes (Signed)
NUTRITION FOLLOW UP  Intervention:   Continue current TPN dosing per Pharmacy; no longer need to underfeed. Goal intake is 1250 kcals and 115 gm protein. Unable to meet protein needs without exceeding calorie goals due to premixed Clinimix solution.  Nutrition Dx:   Inadequate oral intake related to inability to eat as evidenced by NPO status. Ongoing.  Goal:   Intake to meet >90% of estimated nutrition needs. Unmet due to pre-mixed Clinimix solution.  Monitor:   TPN tolerance/adequacy, weight trend, labs, vent status, ability to transition to enteral nutrition.  Assessment:   Patient is a 73 y/o F [facility] for planned repair of enterocolonic fistula. S/P exploratory laparotomy with SB resection on 2/13. Course complicated by ileus, renal failure, cardiopulmonary arrest. [facility].   Still receiving CRRT. S/P tracheostomy on 3/11. Patient with enterocutaneous fistula; controlled with Eakin's pouch. Still unable to feed enterally.  Patient is receiving TPN with Clinimix 5/15 @ 83 ml/hr.  Lipids (20% IVFE @ 10 ml/hr) are provided 2 times weekly (Mon/Fri).  Provides 1553 kcal and 100 grams protein daily (based on weekly average).  Meets 124% minimum estimated kcal and 87% minimum estimated protein needs.  Patient is currently intubated on ventilator support.  MV: 8.7 L/min Temp (24hrs), Avg:96.8 F (36 C), Min:94.3 F (34.6 C), Max:98.2 F (36.8 C)   Height: Ht Readings from Last 1 Encounters:  21-Aug-2013 5\' 1"  (1.549 m)    Weight Status:  Trending down with negative fluid status. Wt Readings from Last 1 Encounters:  08/21/13 146 lb 9.7 oz (66.5 kg)  08/14/13  139 lb 6.4 oz (63.231 kg) 08/08/13  190 lb 14.7 oz (86.6 kg)   Body mass index is 27.72 kg/(m^2).  Re-estimated needs:  Kcal: 1250 Protein: 115 gm Fluid: 1.3 L  Skin: abdominal incision  Diet Order:  NPO   Intake/Output Summary (Last 24 hours) at 08/21/13  1124 Last data filed at 08/21/13 1100  Gross per 24 hour  Intake 3882.56 ml  Output   4773 ml  Net -890.44 ml    Last BM: 3/11   Labs:   Recent Labs Lab 08/19/13 0516  08/20/13 0508 08/20/13 1300 08/21/13 0420  NA 129*  < > 135* 131* 129*  K 5.0  < > 3.5* 5.0 4.4  CL 95*  < > 104 97 95*  CO2 24  < > 20 24 22   BUN 37*  < > 33* 38* 37*  CREATININE 0.40*  < > 0.34* 0.44* 0.38*  CALCIUM 8.5  < > 6.8* 7.7* 8.2*  MG 2.5  --  1.9  --  2.3  PHOS 3.6  < > 2.1* 2.3 1.8*  GLUCOSE 116*  < > 94 141* 120*  < > = values in this interval not displayed.  CBG (last 3)   Recent Labs  08/20/13 2334 08/21/13 0349 08/21/13 0749  GLUCAP 134* 105* 110*    Scheduled Meds: . antiseptic oral rinse  15 mL Mouth Rinse QID  . chlorhexidine  15 mL Mouth Rinse BID  . hydrocortisone sod succinate (SOLU-CORTEF) inj  50 mg Intravenous Q6H  . imipenem-cilastatin  250 mg Intravenous 3 times per day  . insulin aspart  0-15 Units Subcutaneous 6 times per day  . linezolid  600 mg Intravenous Q12H  . micafungin (MYCAMINE) IV  100 mg Intravenous Daily  . pantoprazole (PROTONIX) IV  40 mg Intravenous Q24H  . sodium chloride  2,000 mL  Intravenous Once  . sodium phosphate  Dextrose 5% IVPB  30 mmol Intravenous Once    Continuous Infusions: . Marland KitchenTPN (CLINIMIX-E) Adult    . sodium chloride 10 mL/hr at 08/19/13 1928  . heparin 10,000 units/ 20 mL infusion syringe Stopped (08/10/13 1321)  . norepinephrine (LEVOPHED) Adult infusion 18 mcg/min (08/21/13 1100)  . dialysis replacement fluid (prismasate) 200 mL/hr at 08/20/13 2321  . dialysis replacement fluid (prismasate) 200 mL/hr at 08/20/13 2322  . dialysate (PRISMASATE) 1,000 mL/hr at 08/21/13 1106  . TPN (CLINIMIX) Adult without lytes 83 mL/hr at 08/20/13 1807  . vasopressin (PITRESSIN) infusion - *FOR SHOCK* 0.03 Units/min (08/21/13 1100)     Joaquin Courts, RD, LDN, CNSC Pager [phone] After Hours Pager [phone]

## 2013-08-22 ENCOUNTER — Inpatient Hospital Stay (HOSPITAL_COMMUNITY): Payer: Medicare Other

## 2013-08-22 LAB — RENAL FUNCTION PANEL
Albumin: 1.6 g/dL — ABNORMAL LOW (ref 3.5–5.2)
BUN: 37 mg/dL — ABNORMAL HIGH (ref 6–23)
CO2: 23 meq/L (ref 19–32)
Calcium: 7.9 mg/dL — ABNORMAL LOW (ref 8.4–10.5)
Chloride: 94 mEq/L — ABNORMAL LOW (ref 96–112)
Creatinine, Ser: 0.41 mg/dL — ABNORMAL LOW (ref 0.50–1.10)
GFR calc non Af Amer: >90 mL/min (ref >90)
GLUCOSE: 86 mg/dL (ref 70–99)
Phosphorus: 3.5 mg/dL (ref 2.3–4.6)
Potassium: 4.4 mEq/L (ref 3.7–5.3)
SODIUM: 129 meq/L — AB (ref 137–147)

## 2013-08-22 LAB — GLUCOSE, CAPILLARY
GLUCOSE-CAPILLARY: 76 mg/dL (ref 70–99)
Glucose-Capillary: 110 mg/dL — ABNORMAL HIGH (ref 70–99)
Glucose-Capillary: 126 mg/dL — ABNORMAL HIGH (ref 70–99)
Glucose-Capillary: 67 mg/dL — ABNORMAL LOW (ref 70–99)
Glucose-Capillary: 68 mg/dL — ABNORMAL LOW (ref 70–99)
Glucose-Capillary: 76 mg/dL (ref 70–99)
Glucose-Capillary: 78 mg/dL (ref 70–99)

## 2013-08-22 LAB — CBC
HCT: 27.1 % — ABNORMAL LOW (ref 36.0–46.0)
HEMOGLOBIN: 8.4 g/dL — AB (ref 12.0–15.0)
MCH: 32.1 pg (ref 26.0–34.0)
MCHC: 31 g/dL (ref 30.0–36.0)
MCV: 103.4 fL — ABNORMAL HIGH (ref 78.0–100.0)
PLATELETS: 121 10*3/uL — AB (ref 150–400)
RBC: 2.62 MIL/uL — ABNORMAL LOW (ref 3.87–5.11)
RDW: 24.2 % — ABNORMAL HIGH (ref 11.5–15.5)
WBC: 10.8 10*3/uL — ABNORMAL HIGH (ref 4.0–10.5)

## 2013-08-22 LAB — CBC WITH DIFFERENTIAL/PLATELET
BASOS ABS: 0 10*3/uL (ref 0.0–0.1)
BASOS PCT: 0 % (ref 0–1)
EOS ABS: 0 10*3/uL (ref 0.0–0.7)
Eosinophils Relative: 0 % (ref 0–5)
HEMATOCRIT: 26.8 % — AB (ref 36.0–46.0)
HEMOGLOBIN: 8.2 g/dL — AB (ref 12.0–15.0)
LYMPHS PCT: 5 % — AB (ref 12–46)
Lymphs Abs: 0.6 10*3/uL — ABNORMAL LOW (ref 0.7–4.0)
MCH: 31.5 pg (ref 26.0–34.0)
MCHC: 30.6 g/dL (ref 30.0–36.0)
MCV: 103.1 fL — ABNORMAL HIGH (ref 78.0–100.0)
MONOS PCT: 10 % (ref 3–12)
Monocytes Absolute: 1.2 10*3/uL — ABNORMAL HIGH (ref 0.1–1.0)
Neutro Abs: 9.8 10*3/uL — ABNORMAL HIGH (ref 1.7–7.7)
Neutrophils Relative %: 85 % — ABNORMAL HIGH (ref 43–77)
Platelets: 114 10*3/uL — ABNORMAL LOW (ref 150–400)
RBC: 2.6 MIL/uL — ABNORMAL LOW (ref 3.87–5.11)
RDW: 24.1 % — ABNORMAL HIGH (ref 11.5–15.5)
WBC: 11.6 10*3/uL — AB (ref 4.0–10.5)

## 2013-08-22 LAB — COMPREHENSIVE METABOLIC PANEL
ALK PHOS: 341 U/L — AB (ref 39–117)
ALT: 116 U/L — ABNORMAL HIGH (ref 0–35)
AST: 298 U/L — AB (ref 0–37)
Albumin: 1.6 g/dL — ABNORMAL LOW (ref 3.5–5.2)
BUN: 38 mg/dL — ABNORMAL HIGH (ref 6–23)
CALCIUM: 8.1 mg/dL — AB (ref 8.4–10.5)
CO2: 23 meq/L (ref 19–32)
Chloride: 93 mEq/L — ABNORMAL LOW (ref 96–112)
Creatinine, Ser: 0.41 mg/dL — ABNORMAL LOW (ref 0.50–1.10)
GFR calc Af Amer: >90 mL/min (ref >90)
GFR calc non Af Amer: >90 mL/min (ref >90)
Glucose, Bld: 95 mg/dL (ref 70–99)
Potassium: 4.4 mEq/L (ref 3.7–5.3)
SODIUM: 128 meq/L — AB (ref 137–147)
TOTAL PROTEIN: 5.7 g/dL — AB (ref 6.0–8.3)
Total Bilirubin: 6.5 mg/dL — ABNORMAL HIGH (ref 0.3–1.2)

## 2013-08-22 LAB — PHOSPHORUS: PHOSPHORUS: 3.7 mg/dL (ref 2.3–4.6)

## 2013-08-22 LAB — MAGNESIUM: Magnesium: 2.3 mg/dL (ref 1.5–2.5)

## 2013-08-22 MED ORDER — M.V.I. ADULT IV INJ
INTRAVENOUS | Status: DC
  Administered 2013-08-22: 17:00:00 via INTRAVENOUS
  Filled 2013-08-22: qty 2000

## 2013-08-22 NOTE — Consult Note (Signed)
WOC follow-up: Removed Eakin pouch for CCS PA to assess site.  Staples removed surrounding wound.  Full thickness midline abd wound 100% green slough, mod amt thick green drainage, no odor.  7X3X7cm, no breakdown surrounding wound.  Applied Medium Eakin pouch to gravity drainage bag.  Extra supplies at bedside for staff use PRN if leakage occurs. Cammie Mcgeeawn Matas Burrows MSN, RN, CWOCN, [place], CNS [phone]

## 2013-08-22 NOTE — Progress Notes (Signed)
PULMONARY / CRITICAL CARE MEDICINE   Name: Leah [name] MRN: [id number] DOB: 08/09/1940  LOS 15 days    ADMISSION DATE:  08/10/2013  [facility]  [facility]  PRIMARY SERVICE: PCCM  CHIEF COMPLAINT:  Respiratory Failure   BRIEF PATIENT DESCRIPTION: 73 y/[facility]p [facility] complicated by SB fistula, now with sepsis and enterocutaneous fistula. .  Course was complicated by ileus, renal failure, cardiopulmonary arrest.  [facility].   LINES / TUBES: R IJ TLC 2/14 >> 3/04 ETT([place])  2/23 >> 08/14/13, 3/11 ([place]) >> R IJ HD cath 3/04 >>  L IJ TLC 3/04 >>  ? Date aline >.3/15  CULTURES: ?date [facility] >>> ENTEROCOCCUS FAECALIS 3/4 MRSA PCR >>> neg 3/4 Urine >>>  Multiple bacterial morphotypes  3/5 Blood >>  Neg 3/16 BC>>>  ANTIBIOTICS: Vancomycin 3/4 >> 3/08 Imipenem 3/4 >>> Mycafungin 3/12 >>> linazolid 3/16 (continued high pressors, high risk VRE, plat count noted)>>>  SIGNIFICANT EVENTS / STUDIES:  2/13  OR >>> Ex-lap with small bowel resection, extensive lysis of adhesions, vomiting 2/22  Cardiac arrest - VDRF/Intubated 08/24/2013 - ADMIT [facility] 3/05  TTE: EF 35-40, grade 2 diastolic dysfunction  3/05  CVVHD started 3/8 - No new issues. Tolerates PS 10 cm H2O 08/12/13: 40# off since [facility] with CRRT. Still anuric. Patient is DNAR but full medical care. On levophed 11mcg.. Family at bedside 08/13/13: No sedation but on levophed 20mcg. RN says occ opens eyes but unresponsive to this MD (last fentanyl was 1aM) 08/14/13: For Trach today. WC upa at 40; worsening LLQ enteric fistula iwtih collection on CT; high risk for re-exploration per CCS. Still on levophed at 28mcg. On CRRT. Is overbreathing vent; opens eyes 3/16- remains with high pressor needs 3/17 pressors are weaning down 3/18 CT ([place])>>>Persistent evidence of enterocutaneous fistula, the origin of   which appears to be a defect in the lateral wall of a loop of small  bowel in the left lower quadrant (likely jejunal), as above. 3/18 ct head>>>neg acute 3/19- pressors increased  SUBJECTIVE/OVERNIGHT/INTERVAL HX Levo up  VITAL SIGNS: Temp:  [97.3 F (36.3 C)-98.7 F (37.1 C)] 98.7 F (37.1 C) (03/19 0800) Pulse Rate:  [74-101] 88 (03/19 0828) Resp:  [13-32] 24 (03/19 0828) BP: (77-130)/(38-80) 110/80 mmHg (03/19 0828) SpO2:  [97 %-100 %] 100 % (03/19 0828) FiO2 (%):  [40 %] 40 % (03/19 0828) Weight:  [63.6 kg (140 lb 3.4 oz)] 63.6 kg (140 lb 3.4 oz) (03/19 0330)  HEMODYNAMICS: CVP:  [6 mmHg] 6 mmHg VENTILATOR SETTINGS: Vent Mode:  [-] PSV;CPAP FiO2 (%):  [40 %] 40 % Set Rate:  [16 bmp] 16 bmp Vt Set:  [380 mL] 380 mL PEEP:  [5 cmH20] 5 cmH20 Pressure Support:  [14 cmH20] 14 cmH20 Plateau Pressure:  [20 cmH20] 20 cmH20  INTAKE / OUTPUT: Intake/Output     03/18 0701 - 03/19 0700 03/19 0701 - 03/20 0700   I.V. (mL/kg) 1119.7 (17.6) 25.9 (0.4)   Other 20    NG/GT 650 30   IV Piggyback 1215    TPN 1992 83   Total Intake(mL/kg) 4996.7 (78.6) 138.9 (2.2)   Emesis/NG output 450 0   Drains 413 0   Other 5366 190   Total Output 6229 190   Net -1232.3 -51.1         PHYSICAL EXAMINATION: General:  Very deconditioned. Critically ill looking Neuro:  Diffusely weak, rass -1 HEENT: WNL Cardiovascular:  RRR s M Lungs: coarse, diminished bases Abdomen:  BS absent, LLQ colostomy, RLQ fistula draining serous fluid, open wound Ext:  Edema noted unchanged  PULMONARY  Recent Labs Lab 08/16/13 1113 08/20/13 0520  PHART  --  7.366  PCO2ART  --  40.2  PO2ART  --  55.0*  HCO3  --  23.2  TCO2  --  24  O2SAT 61.9 88.0    CBC  Recent Labs Lab 08/21/13 1230 08/22/13 0032 08/22/13 0521  HGB 8.6* 8.4* 8.2*  HCT 26.9* 27.1* 26.8*  WBC 11.5* 10.8* 11.6*  PLT 103* 121* 114*    COAGULATION No results found for this basename: INR,  in the last 168 hours  CARDIAC     Recent Labs Lab 08/15/13 1118 08/16/13 0442 08/16/13 1200 08/16/13 1838 08/17/13 0238  TROPONINI <0.30 0.55* 0.62* 0.54* 0.79*   No results found for this basename: PROBNP,  in the last 168 hours   CHEMISTRY  Recent Labs Lab 08/18/13 0510  08/19/13 0516  08/20/13 0508 08/20/13 1300 08/21/13 0420 08/21/13 1500 08/22/13 0521  NA 126*  < > 129*  < > 135* 131* 129* 127* 128*  K 4.9  < > 5.0  < > 3.5* 5.0 4.4 4.0 4.4  CL 93*  < > 95*  < > 104 97 95* 92* 93*  CO2 21  < > 24  < > 20 24 22 22 23   GLUCOSE 151*  < > 116*  < > 94 141* 120* 93 95  BUN 32*  < > 37*  < > 33* 38* 37* 36* 38*  CREATININE 0.35*  < > 0.40*  < > 0.34* 0.44* 0.38* 0.38* 0.41*  CALCIUM 8.1*  < > 8.5  < > 6.8* 7.7* 8.2* 8.2* 8.1*  MG 2.2  --  2.5  --  1.9  --  2.3  --  2.3  PHOS 2.7  < > 3.6  < > 2.1* 2.3 1.8* 3.2 3.7  < > = values in this interval not displayed. Estimated Creatinine Clearance: 55.1 ml/min (by C-G formula based on Cr of 0.41).   LIVER  Recent Labs Lab 08/16/13 0442  08/19/13 0516 08/19/13 0930  08/20/13 0508 08/20/13 1300 08/21/13 0420 08/21/13 1500 08/22/13 0521  AST 164*  --  258*  --   --  294*  --  386*  --  298*  ALT 75*  --  77*  --   --  89*  --  128*  --  116*  ALKPHOS 199*  --  326*  --   --  284*  --  364*  --  341*  BILITOT 4.8*  --  7.9* 7.9*  --  6.3*  --  7.3*  --  6.5*  PROT 4.8*  --  5.5*  --   --  4.4*  --  5.7*  --  5.7*  ALBUMIN 1.4*  < > 1.5*  --   < > 1.2* 1.4* 1.6* 1.7* 1.6*  < > = values in this interval not displayed.   INFECTIOUS  Recent Labs Lab 08/15/13 1117 08/16/13 0442  LATICACIDVEN 2.8*  --   PROCALCITON  --  7.95     ENDOCRINE CBG (last 3)   Recent Labs  08/21/13 2334 08/22/13 0345 08/22/13 0803  GLUCAP 110* 76 78    IMAGING x48h  Ct Abdomen Pelvis Wo Contrast  08/21/2013   CLINICAL DATA:  Evaluate for abscess.  EXAM:  CT CHEST, ABDOMEN AND PELVIS WITHOUT CONTRAST  TECHNIQUE: Multidetector CT imaging of the chest, abdomen  and pelvis was performed following the standard protocol without IV contrast.  COMPARISON:  CT of the abdomen and pelvis 08/06/2013.  FINDINGS: CT CHEST FINDINGS  Mediastinum: Tracheostomy tube in position with tip in the trachea immediately above the level of the aortic arch. Left internal jugular central venous catheter with tip in the mid superior vena cava. Right internal jugular central venous catheter with tip terminating in the distal superior vena cava. Nasogastric tube extending into the antral pre-pyloric region of the stomach. Heart size is mildly enlarged. There is no significant pericardial fluid, thickening or pericardial calcification. There is atherosclerosis of the thoracic aorta, the great vessels of the mediastinum and the coronary arteries, including calcified atherosclerotic plaque in the left main, left anterior descending, left circumflex and right coronary arteries. 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 Numerous reactive size mediastinal lymph nodes. Please note that accurate exclusion of hilar adenopathy is limited on noncontrast CT scans.  Lungs/Pleura: Widespread interstitial and airspace disease throughout the lungs bilaterally, predominantly in a peribronchovascular distribution, most suggestive of severe multilobar pneumonia. Near complete atelectasis of the left lower lobe with significant passive atelectasis in the right lower lobe as well. Moderate to large bilateral pleural effusions layering dependently are simple in appearance on today's non contrast CT examination.  Musculoskeletal: Median sternotomy wires. There are no aggressive appearing lytic or blastic lesions noted in the visualized portions of the skeleton.  CT ABDOMEN AND PELVIS FINDINGS  Abdomen/Pelvis: Intermediate to high attenuation material filling the gallbladder, favored to reflect biliary sludge. No current findings to suggest acute cholecystitis at this time. The unenhanced appearance  of the liver, pancreas, spleen and bilateral adrenal glands is unremarkable. Low-attenuation lesions again noted in the kidneys bilaterally, incompletely characterized on today's non contrast CT scan, largest of which measures 6.8 cm in the upper pole of the right kidney (previously characterized as simple cysts). Extensive atherosclerosis throughout the abdominal and pelvic vasculature, without evidence of aneurysm. Trace volume of ascites. Mesenteric edema. Status post partial sigmoidectomy with left lower quadrant colostomy. Parastomal hernia containing predominantly omental fat, similar to the prior study.  Image 87 and 88 of series 2 again demonstrate what appears to be in a defect in the lateral wall of a loop of small bowel in the left lower quadrant of the abdomen (likely jejunal), through which enteric contrast extends extraluminally. This then extends along the fistulous tract toward the anterior abdominal wall, although the exact delineation of the tract is not as well demonstrated as on recent prior examination 08/13/2013. 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 Several small locules of gas are also noted in the deep subcutaneous fat of the anterior abdominal wall in the area of the fistula.  Musculoskeletal: There are no aggressive appearing lytic or blastic lesions noted in the visualized portions of the skeleton. Diffuse body wall edema similar to the prior study.  IMPRESSION: 1. Persistent evidence of enterocutaneous fistula, the origin of which appears to be a defect in the lateral wall of a loop of small bowel in the left lower quadrant (likely jejunal), as above. 2. The appearance of the lungs is most compatible with severe multilobar pneumonia. Alternatively, similar findings could be seen in the setting of ARDS. Clinical  correlation is  recommended. 3. Moderate to large bilateral pleural effusions with complete atelectasis of the left lower lobe and significant passive atelectasis in the right lower lobe. 4. Gallbladder likely contains a large amount of biliary sludge. No current findings to suggest acute cholecystitis at this time. 5. Multiple additional findings, as above, similar to prior studies.   Electronically Signed   By: Trudie Reed M.D.   On: 08/21/2013 17:59   Ct Head Wo Contrast  08/21/2013   CLINICAL DATA:  Evaluate stroke.  EXAM: CT HEAD WITHOUT CONTRAST  TECHNIQUE: Contiguous axial images were obtained from the base of the skull through the vertex without intravenous contrast.  COMPARISON:  CT PORT HEAD WO CM dated 08/15/2013  FINDINGS: There is no evidence of mass effect, midline shift, or extra-axial fluid collections. There is no evidence of a space-occupying lesion or intracranial hemorrhage. There is no evidence of a cortical-based area of acute infarction. There is an old left cerebellar infarct. There is generalized cerebral atrophy. There is periventricular white matter low attenuation likely secondary to microangiopathy.  The ventricles and sulci are appropriate for the patient's age. The basal cisterns are patent.  Visualized portions of the orbits are unremarkable. There is opacification of bilateral mastoid air cells, right greater than left. Cerebrovascular atherosclerotic calcifications are noted.  The osseous structures are unremarkable.  IMPRESSION: 1. No acute intracranial pathology. 2. Complete opacification of the right mastoid air cells and partial left mastoid opacification.   Electronically Signed   By: Elige Ko   On: 08/21/2013 17:39   Ct Chest Wo Contrast  08/21/2013   CLINICAL DATA:  Evaluate for abscess.  EXAM: CT CHEST, ABDOMEN AND PELVIS WITHOUT CONTRAST  TECHNIQUE: Multidetector CT imaging of the chest, abdomen and pelvis was performed following the standard protocol without  IV contrast.  COMPARISON:  CT of the abdomen and pelvis 08/06/2013.  FINDINGS: CT CHEST FINDINGS  Mediastinum: Tracheostomy tube in position with tip in the trachea immediately above the level of the aortic arch. Left internal jugular central venous catheter with tip in the mid superior vena cava. Right internal jugular central venous catheter with tip terminating in the distal superior vena cava. Nasogastric tube extending into the antral pre-pyloric region of the stomach. Heart size is mildly enlarged. There is no significant pericardial fluid, thickening or pericardial calcification. There is atherosclerosis of the thoracic aorta, the great vessels of the mediastinum and the coronary arteries, including calcified atherosclerotic plaque in the left main, left anterior descending, left circumflex and right coronary arteries. 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 Numerous reactive size mediastinal lymph nodes. Please note that accurate exclusion of hilar adenopathy is limited on noncontrast CT scans.  Lungs/Pleura: Widespread interstitial and airspace disease throughout the lungs bilaterally, predominantly in a peribronchovascular distribution, most suggestive of severe multilobar pneumonia. Near complete atelectasis of the left lower lobe with significant passive atelectasis in the right lower lobe as well. Moderate to large bilateral pleural effusions layering dependently are simple in appearance on today's non contrast CT examination.  Musculoskeletal: Median sternotomy wires. There are no aggressive appearing lytic or blastic lesions noted in the visualized portions of the skeleton.  CT ABDOMEN AND PELVIS FINDINGS  Abdomen/Pelvis: Intermediate to high attenuation material filling the gallbladder, favored to reflect biliary sludge. No current findings to suggest acute cholecystitis at this time. The unenhanced appearance of the liver, pancreas, spleen and bilateral adrenal glands is  unremarkable. Low-attenuation lesions again noted in the kidneys bilaterally, incompletely  characterized on today's non contrast CT scan, largest of which measures 6.8 cm in the upper pole of the right kidney (previously characterized as simple cysts). Extensive atherosclerosis throughout the abdominal and pelvic vasculature, without evidence of aneurysm. Trace volume of ascites. Mesenteric edema. Status post partial sigmoidectomy with left lower quadrant colostomy. Parastomal hernia containing predominantly omental fat, similar to the prior study.  Image 87 and 88 of series 2 again demonstrate what appears to be in a defect in the lateral wall of a loop of small bowel in the left lower quadrant of the abdomen (likely jejunal), through which enteric contrast extends extraluminally. This then extends along the fistulous tract toward the anterior abdominal wall, although the exact delineation of the tract is not as well demonstrated as on recent prior examination 08/13/2013. 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 Several small locules of gas are also noted in the deep subcutaneous fat of the anterior abdominal wall in the area of the fistula.  Musculoskeletal: There are no aggressive appearing lytic or blastic lesions noted in the visualized portions of the skeleton. Diffuse body wall edema similar to the prior study.  IMPRESSION: 1. Persistent evidence of enterocutaneous fistula, the origin of which appears to be a defect in the lateral wall of a loop of small bowel in the left lower quadrant (likely jejunal), as above. 2. The appearance of the lungs is most compatible with severe multilobar pneumonia. Alternatively, similar findings could be seen in the setting of ARDS. Clinical correlation is recommended. 3. Moderate to large bilateral  pleural effusions with complete atelectasis of the left lower lobe and significant passive atelectasis in the right lower lobe. 4. Gallbladder likely contains a large amount of biliary sludge. No current findings to suggest acute cholecystitis at this time. 5. Multiple additional findings, as above, similar to prior studies.   Electronically Signed   By: Trudie Reed M.D.   On: 08/21/2013 17:59   Dg Chest Port 1 View  08/22/2013   CLINICAL DATA:  Assess pulmonary infiltrates and tracheostomy position  EXAM: PORTABLE CHEST - 1 VIEW  COMPARISON:  CT CHEST W/O CM dated 08/21/2013; DG CHEST 1V PORT dated 08/21/2013  FINDINGS: The lungs are adequately inflated. The interstitial markings remain increased bilaterally and are confluent in the right mid lung and in the retrocardiac region on the left. The left hemidiaphragm remains obscured. The cardiopericardial silhouette remains mildly enlarged. The pulmonary vascularity remains prominent centrally. The patient has undergone previous CABG.  The large caliber right internal jugular venous catheter is unchanged in position. The left internal jugular venous catheter tip also appears stable. The tracheostomy appliance tip lies of the level of the clavicular heads. The esophagogastric tube tip and proximal port project below the left hemidiaphragm.  IMPRESSION: 1. There are stable appearing pulmonary parenchymal interstitial and alveolar densities consistent with CHF. As has been previously mentioned ARDS may be developing. There has not been dramatic interval change since yesterday's study. 2. The support tubes and lines appear to be in appropriate position.   Electronically Signed   By: David  [place]   On: 08/22/2013 07:26   Dg Chest Port 1 View  08/21/2013   CLINICAL DATA:  Hypoxia  EXAM: PORTABLE CHEST - 1 VIEW  COMPARISON:  August 20, 2013  FINDINGS: Endotracheal tube tip is 4.3 cm above the carina. Right jugular catheter tip is  in the superior vena cava near the  cavoatrial junction. Left central catheter tip is at the junction of the left innominate artery and superior vena cava. Nasogastric tube tip and side port are below the diaphragm. No pneumothorax. There remains extensive interstitial and patchy alveolar edema bilaterally, stable. Heart is enlarged with pulmonary venous hypertension. Patient is status post coronary artery bypass grafting.  IMPRESSION: Cardiomegaly and pulmonary venous hypertension with interstitial and alveolar edema. These are findings consistent with congestive heart failure. A degree of underlying ARDS cannot be excluded radiographically. Tube and catheter positions as described without pneumothorax.   Electronically Signed   By: Bretta Bang M.D.   On: 08/21/2013 07:21    ASSESSMENT / PLAN:  PULMONARY A: Acute and chronic respiratory failure with prolonged mech vent/chronic critical illness  - s/p trach 08/14/13 COPD without exacerbation LLL PNA on CT abd 08/13/13  - Failure to wean due to severe physical deconditioning, septic shock and poor mental status Large effusions, compressive P:   Remains on low O2 needs Wean PS 14, as WOB increased pcxr in am  continue neg 1-2 liters daily, CT reviewed impressive edema, effusions, no role thora  CARDIOVASCULAR A:  Septic shock Cardiogenic shock S/p Cardiopulmonary arrest Acute on chronic systolic and diastolic CHF NSTEMI 2/23 [facility])  P:  Levophed to MAP goal 60, re increased to 18, max this agent at 25 mics  maintain vaso maintain stress steroids at 50mg  q 6 h until off pressors May improved hemodyncmis even with neg balance - maintain  RENAL A:   ARF, hyponatremia  P:   CRRT per Nephrology Did well overall with neg 1 liter, maintain  GASTROINTESTINAL A:  S/p HArtmans procedure Enterocolonic fistula  Protein calorie malnutrition Cholestasis confirmed by bili  P:   SUP: IV PPI Cont TPN Surgery following CT neg new abscess  HEMATOLOGIC A:    Anemia s/p PRBC 08/17/13 Mild thrombocytopenia, extensive hep exposure Plt 103 P:  DVT px: SCDs Ensure HIT pending Cont linazolid cb cin am   INFECTIOUS A:   Septic shock persists in setting of complicated GI surgery UTI P:   CT abdo /pelvis / chest - reviewed Continue  linazolid 3/16, high risk VRE Repeat BC - neg Continue imi, caspo empiric, will consider narrow in am   ENDOCRINE A:   Hyperglycemia 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  CT head negative for acute abnormalities  P:   Goal RASS 0 to -1 Cont PRN fentanyl ct reviewed I have started discusions for comfort care, will re discuss with husband  I have fully examined this patient and agree with above findings.    And edited in full  Ccm time 30 min Will rediscuss consideration comfort  Mcarthur Rossetti. Tyson Alias, MD, FACP Pgr: [phone] [facility]

## 2013-08-22 NOTE — Progress Notes (Addendum)
PARENTERAL NUTRITION CONSULT NOTE:  FOLLOW-UP  Pharmacy Consult:  TPN Indication: Suspect TF intolerance d/t to severe bowel edema  Allergies  Allergen Reactions  . Cephalexin Other (See Comments)    Unknown  . Codeine Nausea And Vomiting    Patient Measurements: Height: 5\' 1"  (154.9 cm) Weight: 140 lb 3.4 oz (63.6 kg) IBW/kg (Calculated) : 47.8  Usual Weight: 55 kg (this was admit weight at [facility] 2/13)  Vital Signs: Temp: 98.4 F (36.9 C) (03/19 0800) Temp src: Oral (03/19 0800) BP: 130/53 mmHg (03/19 0600) Pulse Rate: 87 (03/19 0600) Intake/Output from previous day: 03/18 0701 - 03/19 0700 In: 4996.7 [I.V.:1119.7; NG/GT:650; IV Piggyback:1215; TPN:1992] Out: 6229 [Emesis/NG output:450; Drains:413]  Labs:  Recent Labs  08/21/13 1230 08/22/13 0032 08/22/13 0521  WBC 11.5* 10.8* 11.6*  HGB 8.6* 8.4* 8.2*  HCT 26.9* 27.1* 26.8*  PLT 103* 121* 114*     Recent Labs  08/19/13 0930  08/20/13 0508 08/20/13 1300 08/21/13 0420 08/21/13 1500 08/22/13 0521  NA  --   < > 135* 131* 129* 127* 128*  K  --   < > 3.5* 5.0 4.4 4.0 4.4  CL  --   < > 104 97 95* 92* 93*  CO2  --   < > 20 24 22 22 23   GLUCOSE  --   < > 94 141* 120* 93 95  BUN  --   < > 33* 38* 37* 36* 38*  CREATININE  --   < > 0.34* 0.44* 0.38* 0.38* 0.41*  CALCIUM  --   < > 6.8* 7.7* 8.2* 8.2* 8.1*  MG  --   --  1.9  --  2.3  --  2.3  PHOS  --   < > 2.1* 2.3 1.8* 3.2  --   PROT  --   --  4.4*  --  5.7*  --  5.7*  ALBUMIN  --   < > 1.2* 1.4* 1.6* 1.7* 1.6*  AST  --   --  294*  --  386*  --  298*  ALT  --   --  89*  --  128*  --  116*  ALKPHOS  --   --  284*  --  364*  --  341*  BILITOT 7.9*  --  6.3*  --  7.3*  --  6.5*  BILIDIR 6.8*  --   --   --   --   --   --   IBILI 1.1*  --   --   --   --   --   --   < > = values in this interval not displayed. Estimated Creatinine Clearance: 55.1 ml/min (by C-G formula based on Cr of 0.41).    Recent Labs  08/21/13 1926 08/21/13 2334 08/22/13 0345   GLUCAP 93 110* 76    Insulin Requirements in the past 24 hours:  2 units moderate SSI + 80 units regular insulin in TPN with CBGs 105-141  Current Nutrition:  Clinimix E 5/15 at 70ml/hr + Lipids 16ml/hr on Mon/Fri (decrease to 2d per week to prevent essential fatty acid deficiency) provides an average of 1716 kCal and 100gm protein daily.  Nutritional Goals:  1393 kCal (goal for permissive underfeeding ~1200 kCal); protein 115 gm per day- RD note 3/11  Assessment: 75 YOF admitted 07/19/13 to [facility] for planned repair of enterocolonic fistula. Post-op course complicated by ileus, renal failure, and cardiopulmonary arrest.  Patient transferred to [facility] on 08/22/2013. +CVVHD.  GI: bowels are too edematous to tolerate TF. 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 Baseline prealbumin was 15.6, decreased to 12.3, decreased further this week to 9.6. TG 139. NG output 469mL/24h, Drains output 451mL/24h charted. Abdomen with open wound and bilious significant drainage from fistula- wound care seeing. Last BM charted 3/11  Endo: no hx DM however requiring significant amounts of insulin, no A1C on file. CBGs are better controlled with increase in insulin in TPN bag. Noted stress dose steroids were started 3/15- to continue until able to wean off pressors  Lytes: Na 128, K 4.4, Phos 3.7, Mag 2.3, CorCa 10.1 (Ca x Phos product = 37); added back lytes to TPN bag per renal request yesterday  Renal: hx CKD- CRRT since 08/08/13 for volume regulation. SCr/BUN stable. Still with increased weight with diffuse anasarca (55kg at [place], initial weight at [facility] was 86kg, now at 66.5kg).  NS at [facility].  Still anuric. TPN providing 2 g/kg/day of protein while on CRRT- also provides ~2L fluid per day.   Pulm: intubated at [facility] - FiO2 30% (stable). Now trached 3/11 remains on vent support.  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   Still requiring Levophed and vasopressin- BP soft-nml, HR ok, but can become tachy.   Hepatobil: no LFTs are WNL- AST/ALT decreased slightly to 298/116 respectively, Tbili down a bit to 6.5, alk phos decreased to 341, albumin stable 1.6  Neuro: GCS 12, RASS -1- only on PRN fentanyl which has not been given for a few days  Heme: Hgb dropped to 8.3, plts 95. No overt bleeding noted. HIT panel was sent 3/16 and came back negative  ID: Primaxin/Micafungin/Zyvox for intra-abd infxn + Enterococcus UTI (suspect VRE), WBC 11.6, currently afebrile   Best Practices: PPI IV, MC, SCDs  TPN Access: CVC placed 08/06/2013  TPN day#: PTA from [place] since 2/27   Plan:  1. Continue Clinimix E 5/15 at 73ml/hr + lipids at 10 ml/hr on Mon/Fri only to reduce total kcal. While she is receiving a higher number of kcal than RD recommendations for permissive underfeeding, she requires higher amounts of protein d/t wound as well as being on CRRT. Unable to concentrate protein with premixed Clinimix bags. 2. Continue daily IV Mulitvitamin in TPN. Since Tbili is still >6, keep trace elements out of bag- add back zinc $RemoveB'5mg'[id number]$  and selenium 55mcg on MWF. Unable to add chromium as per protocol d/t shortage. 3. Continue 80 units regular insulin in TPN 4. Continue moderate scale SSI per MD 5. Conitnue NS KVO 6. TPN labs as ordered 7. Continue to follow for goals of care   Eraina Winnie D. Alson Mcpheeters, PharmD, BCPS Clinical Pharmacist Pager: [phone] 08/22/2013 8:23 AM

## 2013-08-22 NOTE — Progress Notes (Signed)
Patient ID: Leah [name], female   DOB: 07/19/1940, 73 y.o.   MRN: [id number]   [facility] Progress Note    Assessment/ Plan:   1 Anuric AKI: From ischemic/sepsis associated ATN-- continue CRRT for high daily fluid load from TPN and catabolic rate from sepsis. Attempting UF of about 42mL/hr (challenging as she remains on dual pressors). Overall prognosis appears poor with dense renal injury and multi-organ failure 2. Status post Hartman's procedure at [facility] complicated by small bowel fistula, enterocutaneous fistula and sepsis-with septic shock and on broad-spectrum antibiotics. 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 3 Volume overload: Volume overloaded with difficulty in ultrafiltration due to ongoing pressor needs.  4 Hyponatremia:From hypotonic TPN and due to limitations with UF  5 Hypophosphatemia: corrected/replaced  Subjective:   No acute events overnight--- discussions on focusing on comfort care with family initiated by CCM   Objective:   BP 130/53  Pulse 87  Temp(Src) 98.3 F (36.8 C) (Oral)  Resp 18  Ht 5\' 1"  (1.549 m)  Wt 63.6 kg (140 lb 3.4 oz)  BMI 26.51 kg/m2  SpO2 100%  Intake/Output Summary (Last 24 hours) at 08/22/13 0709 Last data filed at 08/22/13 0700  Gross per 24 hour  Intake 4996.74 ml  Output   6229 ml  Net -1232.26 ml   Weight change: -2.9 kg (-6 lb 6.3 oz)  Physical Exam: Gen:on Vent via tracheostomy QVZ:DGLOV RRR, S1 and S2 with ESM Resp:Coarse/mechanical BS FIE:PPIRJ, open wound lower abdomen with drain Ext:2+ dependent edema  Imaging: Ct Abdomen Pelvis Wo Contrast  08/21/2013   CLINICAL DATA:  Evaluate for abscess.  EXAM: CT CHEST, ABDOMEN AND PELVIS WITHOUT CONTRAST  TECHNIQUE: Multidetector CT imaging of the chest, abdomen and pelvis was performed following the standard protocol without IV contrast.  COMPARISON:  CT of the abdomen and pelvis 08/06/2013.   FINDINGS: CT CHEST FINDINGS  Mediastinum: Tracheostomy tube in position with tip in the trachea immediately above the level of the aortic arch. Left internal jugular central venous catheter with tip in the mid superior vena cava. Right internal jugular central venous catheter with tip terminating in the distal superior vena cava. Nasogastric tube extending into the antral pre-pyloric region of the stomach. Heart size is mildly enlarged. There is no significant pericardial fluid, thickening or pericardial calcification. There is atherosclerosis of the thoracic aorta, the great vessels of the mediastinum and the coronary arteries, including calcified atherosclerotic plaque in the left main, left anterior descending, left circumflex and right coronary arteries. 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 Numerous reactive size mediastinal lymph nodes. Please note that accurate exclusion of hilar adenopathy is limited on noncontrast CT scans.  Lungs/Pleura: Widespread interstitial and airspace disease throughout the lungs bilaterally, predominantly in a peribronchovascular distribution, most suggestive of severe multilobar pneumonia. Near complete atelectasis of the left lower lobe with significant passive atelectasis in the right lower lobe as well. Moderate to large bilateral pleural effusions layering dependently are simple in appearance on today's non contrast CT examination.  Musculoskeletal: Median sternotomy wires. There are no aggressive appearing lytic or blastic lesions noted in the visualized portions of the skeleton.  CT ABDOMEN AND PELVIS FINDINGS  Abdomen/Pelvis: Intermediate to high attenuation material filling the gallbladder, favored to reflect biliary sludge. No current findings to suggest acute cholecystitis at this time. The unenhanced appearance of the liver, pancreas, spleen and bilateral adrenal glands is unremarkable. Low-attenuation lesions again noted  in the kidneys  bilaterally, incompletely characterized on today's non contrast CT scan, largest of which measures 6.8 cm in the upper pole of the right kidney (previously characterized as simple cysts). Extensive atherosclerosis throughout the abdominal and pelvic vasculature, without evidence of aneurysm. Trace volume of ascites. Mesenteric edema. Status post partial sigmoidectomy with left lower quadrant colostomy. Parastomal hernia containing predominantly omental fat, similar to the prior study.  Image 87 and 88 of series 2 again demonstrate what appears to be in a defect in the lateral wall of a loop of small bowel in the left lower quadrant of the abdomen (likely jejunal), through which enteric contrast extends extraluminally. This then extends along the fistulous tract toward the anterior abdominal wall, although the exact delineation of the tract is not as well demonstrated as on recent prior examination 08/13/2013. 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 Several small locules of gas are also noted in the deep subcutaneous fat of the anterior abdominal wall in the area of the fistula.  Musculoskeletal: There are no aggressive appearing lytic or blastic lesions noted in the visualized portions of the skeleton. Diffuse body wall edema similar to the prior study.  IMPRESSION: 1. Persistent evidence of enterocutaneous fistula, the origin of which appears to be a defect in the lateral wall of a loop of small bowel in the left lower quadrant (likely jejunal), as above. 2. The appearance of the lungs is most compatible with severe multilobar pneumonia. Alternatively, similar findings could be seen in the setting of ARDS. Clinical correlation is recommended. 3. Moderate to large bilateral pleural effusions with complete atelectasis of the left lower lobe  and significant passive atelectasis in the right lower lobe. 4. Gallbladder likely contains a large amount of biliary sludge. No current findings to suggest acute cholecystitis at this time. 5. Multiple additional findings, as above, similar to prior studies.   Electronically Signed   By: Trudie Reedaniel  Entrikin M.D.   On: 08/21/2013 17:59   Ct Head Wo Contrast  08/21/2013   CLINICAL DATA:  Evaluate stroke.  EXAM: CT HEAD WITHOUT CONTRAST  TECHNIQUE: Contiguous axial images were obtained from the base of the skull through the vertex without intravenous contrast.  COMPARISON:  CT PORT HEAD WO CM dated 08/15/2013  FINDINGS: There is no evidence of mass effect, midline shift, or extra-axial fluid collections. There is no evidence of a space-occupying lesion or intracranial hemorrhage. There is no evidence of a cortical-based area of acute infarction. There is an old left cerebellar infarct. There is generalized cerebral atrophy. There is periventricular white matter low attenuation likely secondary to microangiopathy.  The ventricles and sulci are appropriate for the patient's age. The basal cisterns are patent.  Visualized portions of the orbits are unremarkable. There is opacification of bilateral mastoid air cells, right greater than left. Cerebrovascular atherosclerotic calcifications are noted.  The osseous structures are unremarkable.  IMPRESSION: 1. No acute intracranial pathology. 2. Complete opacification of the right mastoid air cells and partial left mastoid opacification.   Electronically Signed   By: Elige KoHetal  Drayven Marchena   On: 08/21/2013 17:39   Ct Chest Wo Contrast  08/21/2013   CLINICAL DATA:  Evaluate for abscess.  EXAM: CT CHEST, ABDOMEN AND PELVIS WITHOUT CONTRAST  TECHNIQUE: Multidetector CT imaging of the chest, abdomen and pelvis was performed following the standard protocol without IV contrast.  COMPARISON:  CT of the abdomen and pelvis 08/06/2013.  FINDINGS: CT CHEST FINDINGS  Mediastinum: Tracheostomy  tube in position with tip in the trachea immediately above the level of the aortic arch. Left internal jugular central venous catheter with tip in the mid superior vena cava. Right internal jugular central venous catheter with tip terminating in the distal superior vena cava. Nasogastric tube extending into the antral pre-pyloric region of the stomach. Heart size is mildly enlarged. There is no significant pericardial fluid, thickening or pericardial calcification. There is atherosclerosis of the thoracic aorta, the great vessels of the mediastinum and the coronary arteries, including calcified atherosclerotic plaque in the left main, left anterior descending, left circumflex and right coronary arteries. 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 Numerous reactive size mediastinal lymph nodes. Please note that accurate exclusion of hilar adenopathy is limited on noncontrast CT scans.  Lungs/Pleura: Widespread interstitial and airspace disease throughout the lungs bilaterally, predominantly in a peribronchovascular distribution, most suggestive of severe multilobar pneumonia. Near complete atelectasis of the left lower lobe with significant passive atelectasis in the right lower lobe as well. Moderate to large bilateral pleural effusions layering dependently are simple in appearance on today's non contrast CT examination.  Musculoskeletal: Median sternotomy wires. There are no aggressive appearing lytic or blastic lesions noted in the visualized portions of the skeleton.  CT ABDOMEN AND PELVIS FINDINGS  Abdomen/Pelvis: Intermediate to high attenuation material filling the gallbladder, favored to reflect biliary sludge. No current findings to suggest acute cholecystitis at this time. The unenhanced appearance of the liver, pancreas, spleen and bilateral adrenal glands is unremarkable. Low-attenuation lesions again noted in the kidneys bilaterally, incompletely characterized on today's non  contrast CT scan, largest of which measures 6.8 cm in the upper pole of the right kidney (previously characterized as simple cysts). Extensive atherosclerosis throughout the abdominal and pelvic vasculature, without evidence of aneurysm. Trace volume of ascites. Mesenteric edema. Status post partial sigmoidectomy with left lower quadrant colostomy. Parastomal hernia containing predominantly omental fat, similar to the prior study.  Image 87 and 88 of series 2 again demonstrate what appears to be in a defect in the lateral wall of a loop of small bowel in the left lower quadrant of the abdomen (likely jejunal), through which enteric contrast extends extraluminally. This then extends along the fistulous tract toward the anterior abdominal wall, although the exact delineation of the tract is not as well demonstrated as on recent prior examination 08/13/2013. 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 Several small locules of gas are also noted in the deep subcutaneous fat of the anterior abdominal wall in the area of the fistula.  Musculoskeletal: There are no aggressive appearing lytic or blastic lesions noted in the visualized portions of the skeleton. Diffuse body wall edema similar to the prior study.  IMPRESSION: 1. Persistent evidence of enterocutaneous fistula, the origin of which appears to be a defect in the lateral wall of a loop of small bowel in the left lower quadrant (likely jejunal), as above. 2. The appearance of the lungs is most compatible with severe multilobar pneumonia. Alternatively, similar findings could be seen in the setting of ARDS. Clinical correlation is recommended. 3. Moderate to large bilateral pleural effusions with complete atelectasis of the left lower lobe and significant passive atelectasis in the right lower  lobe. 4. Gallbladder likely contains a  large amount of biliary sludge. No current findings to suggest acute cholecystitis at this time. 5. Multiple additional findings, as above, similar to prior studies.   Electronically Signed   By: Trudie Reed M.D.   On: 08/21/2013 17:59   Dg Chest Port 1 View  08/21/2013   CLINICAL DATA:  Hypoxia  EXAM: PORTABLE CHEST - 1 VIEW  COMPARISON:  August 20, 2013  FINDINGS: Endotracheal tube tip is 4.3 cm above the carina. Right jugular catheter tip is in the superior vena cava near the cavoatrial junction. Left central catheter tip is at the junction of the left innominate artery and superior vena cava. Nasogastric tube tip and side port are below the diaphragm. No pneumothorax. There remains extensive interstitial and patchy alveolar edema bilaterally, stable. Heart is enlarged with pulmonary venous hypertension. Patient is status post coronary artery bypass grafting.  IMPRESSION: Cardiomegaly and pulmonary venous hypertension with interstitial and alveolar edema. These are findings consistent with congestive heart failure. A degree of underlying ARDS cannot be excluded radiographically. Tube and catheter positions as described without pneumothorax.   Electronically Signed   By: Bretta Bang M.D.   On: 08/21/2013 07:21    Labs: BMET  Recent Labs Lab 08/18/13 1600 08/19/13 0516 08/19/13 1631 08/20/13 0508 08/20/13 1300 08/21/13 0420 08/21/13 1500 08/22/13 0521  NA 128* 129* 130* 135* 131* 129* 127* 128*  K 4.9 5.0 5.2 3.5* 5.0 4.4 4.0 4.4  CL 95* 95* 95* 104 97 95* 92* 93*  CO2 22 24 25 20 24 22 22 23   GLUCOSE 141* 116* 99 94 141* 120* 93 95  BUN 34* 37* 40* 33* 38* 37* 36* 38*  CREATININE 0.39* 0.40* 0.43* 0.34* 0.44* 0.38* 0.38* 0.41*  CALCIUM 8.2* 8.5 8.6 6.8* 7.7* 8.2* 8.2* 8.1*  PHOS 3.4 3.6 4.7* 2.1* 2.3 1.8* 3.2  --    CBC  Recent Labs Lab 08/18/13 0510 08/19/13 0516 08/20/13 0508  08/21/13 0420 08/21/13 1230 08/22/13 0032  08/22/13 0521  WBC 16.4* 16.0* 11.5*  < > 12.2* 11.5* 10.8* 11.6*  NEUTROABS 14.9* 14.1* 9.7*  --   --   --   --  9.8*  HGB 8.3* 8.2* 6.7*  < > 8.3* 8.6* 8.4* 8.2*  HCT 25.7* 25.7* 21.3*  < > 26.9* 26.9* 27.1* 26.8*  MCV 95.2 97.3 100.5*  < > 102.3* 101.9* 103.4* 103.1*  PLT 48* 59* 58*  < > 95* 103* 121* 114*  < > = values in this interval not displayed.  Medications:    . antiseptic oral rinse  15 mL Mouth Rinse QID  . chlorhexidine  15 mL Mouth Rinse BID  . hydrocortisone sod succinate (SOLU-CORTEF) inj  50 mg Intravenous Q6H  . imipenem-cilastatin  250 mg Intravenous 3 times per day  . insulin aspart  0-15 Units Subcutaneous 6 times per day  . linezolid  600 mg Intravenous Q12H  . micafungin (MYCAMINE) IV  100 mg Intravenous Daily  . pantoprazole (PROTONIX) IV  40 mg Intravenous Q24H  . sodium chloride  2,000 mL Intravenous Once   Zetta Bills, MD 08/22/2013, 7:09 AM

## 2013-08-22 NOTE — Progress Notes (Signed)
Subjective: On vent.  Needs more inotropic support.  CT negative for collection to drain.   Objective: Vital signs in last 24 hours: Temp:  [97.3 F (36.3 C)-98.5 F (36.9 C)] 98.4 F (36.9 C) (03/19 0800) Pulse Rate:  [74-101] 87 (03/19 0600) Resp:  [13-32] 18 (03/19 0600) BP: (77-130)/(34-75) 130/53 mmHg (03/19 0600) SpO2:  [97 %-100 %] 100 % (03/19 0600) FiO2 (%):  [40 %] 40 % (03/19 0800) Weight:  [140 lb 3.4 oz (63.6 kg)] 140 lb 3.4 oz (63.6 kg) (03/19 0330) Last BM Date: 08/14/13  Intake/Output from previous day: 03/18 0701 - 03/19 0700 In: 4996.7 [I.V.:1119.7; NG/GT:650; IV Piggyback:1215; TPN:1992] Out: 6229 [Emesis/NG output:450; Drains:413] Intake/Output this shift: Total I/O In: 138.9 [I.V.:25.9; NG/GT:30; TPN:83] Out: 190 [Other:190]  Incision/Wound:WOCN  took down wound today .  Notes reviewed.  Ostomy pink and viable.   Lab Results:   Recent Labs  08/22/13 0032 08/22/13 0521  WBC 10.8* 11.6*  HGB 8.4* 8.2*  HCT 27.1* 26.8*  PLT 121* 114*   BMET  Recent Labs  08/21/13 1500 08/22/13 0521  NA 127* 128*  K 4.0 4.4  CL 92* 93*  CO2 22 23  GLUCOSE 93 95  BUN 36* 38*  CREATININE 0.38* 0.41*  CALCIUM 8.2* 8.1*   PT/INR No results found for this basename: LABPROT, INR,  in the last 72 hours ABG  Recent Labs  08/20/13 0520  PHART 7.366  HCO3 23.2    Studies/Results: Ct Abdomen Pelvis Wo Contrast  08/21/2013   CLINICAL DATA:  Evaluate for abscess.  EXAM: CT CHEST, ABDOMEN AND PELVIS WITHOUT CONTRAST  TECHNIQUE: Multidetector CT imaging of the chest, abdomen and pelvis was performed following the standard protocol without IV contrast.  COMPARISON:  CT of the abdomen and pelvis 08/06/2013.  FINDINGS: CT CHEST FINDINGS  Mediastinum: Tracheostomy tube in position with tip in the trachea immediately above the level of the aortic arch. Left internal jugular central venous catheter with tip in the mid superior vena cava. Right internal jugular  central venous catheter with tip terminating in the distal superior vena cava. Nasogastric tube extending into the antral pre-pyloric region of the stomach. Heart size is mildly enlarged. There is no significant pericardial fluid, thickening or pericardial calcification. There is atherosclerosis of the thoracic aorta, the great vessels of the mediastinum and the coronary arteries, including calcified atherosclerotic plaque in the left main, left anterior descending, left circumflex and right coronary arteries. 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 Numerous reactive size mediastinal lymph nodes. Please note that accurate exclusion of hilar adenopathy is limited on noncontrast CT scans.  Lungs/Pleura: Widespread interstitial and airspace disease throughout the lungs bilaterally, predominantly in a peribronchovascular distribution, most suggestive of severe multilobar pneumonia. Near complete atelectasis of the left lower lobe with significant passive atelectasis in the right lower lobe as well. Moderate to large bilateral pleural effusions layering dependently are simple in appearance on today's non contrast CT examination.  Musculoskeletal: Median sternotomy wires. There are no aggressive appearing lytic or blastic lesions noted in the visualized portions of the skeleton.  CT ABDOMEN AND PELVIS FINDINGS  Abdomen/Pelvis: Intermediate to high attenuation material filling the gallbladder, favored to reflect biliary sludge. No current findings to suggest acute cholecystitis at this time. The unenhanced appearance of the liver, pancreas, spleen and bilateral adrenal glands is unremarkable. Low-attenuation lesions again noted in the kidneys bilaterally, incompletely characterized on today's non contrast CT scan, largest of which measures 6.8 cm  in the upper pole of the right kidney (previously characterized as simple cysts). Extensive atherosclerosis throughout the abdominal and pelvic  vasculature, without evidence of aneurysm. Trace volume of ascites. Mesenteric edema. Status post partial sigmoidectomy with left lower quadrant colostomy. Parastomal hernia containing predominantly omental fat, similar to the prior study.  Image 87 and 88 of series 2 again demonstrate what appears to be in a defect in the lateral wall of a loop of small bowel in the left lower quadrant of the abdomen (likely jejunal), through which enteric contrast extends extraluminally. This then extends along the fistulous tract toward the anterior abdominal wall, although the exact delineation of the tract is not as well demonstrated as on recent prior examination 08/13/2013. 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 Several small locules of gas are also noted in the deep subcutaneous fat of the anterior abdominal wall in the area of the fistula.  Musculoskeletal: There are no aggressive appearing lytic or blastic lesions noted in the visualized portions of the skeleton. Diffuse body wall edema similar to the prior study.  IMPRESSION: 1. Persistent evidence of enterocutaneous fistula, the origin of which appears to be a defect in the lateral wall of a loop of small bowel in the left lower quadrant (likely jejunal), as above. 2. The appearance of the lungs is most compatible with severe multilobar pneumonia. Alternatively, similar findings could be seen in the setting of ARDS. Clinical correlation is recommended. 3. Moderate to large bilateral pleural effusions with complete atelectasis of the left lower lobe and significant passive atelectasis in the right lower lobe. 4. Gallbladder likely contains a large amount of biliary sludge. No current findings to suggest acute cholecystitis at this time. 5. Multiple additional findings, as above, similar to prior  studies.   Electronically Signed   By: Trudie Reed M.D.   On: 08/21/2013 17:59   Ct Head Wo Contrast  08/21/2013   CLINICAL DATA:  Evaluate stroke.  EXAM: CT HEAD WITHOUT CONTRAST  TECHNIQUE: Contiguous axial images were obtained from the base of the skull through the vertex without intravenous contrast.  COMPARISON:  CT PORT HEAD WO CM dated 08/15/2013  FINDINGS: There is no evidence of mass effect, midline shift, or extra-axial fluid collections. There is no evidence of a space-occupying lesion or intracranial hemorrhage. There is no evidence of a cortical-based area of acute infarction. There is an old left cerebellar infarct. There is generalized cerebral atrophy. There is periventricular white matter low attenuation likely secondary to microangiopathy.  The ventricles and sulci are appropriate for the patient's age. The basal cisterns are patent.  Visualized portions of the orbits are unremarkable. There is opacification of bilateral mastoid air cells, right greater than left. Cerebrovascular atherosclerotic calcifications are noted.  The osseous structures are unremarkable.  IMPRESSION: 1. No acute intracranial pathology. 2. Complete opacification of the right mastoid air cells and partial left mastoid opacification.   Electronically Signed   By: Elige Ko   On: 08/21/2013 17:39   Ct Chest Wo Contrast  08/21/2013   CLINICAL DATA:  Evaluate for abscess.  EXAM: CT CHEST, ABDOMEN AND PELVIS WITHOUT CONTRAST  TECHNIQUE: Multidetector CT imaging of the chest, abdomen and pelvis was performed following the standard protocol without IV contrast.  COMPARISON:  CT of the abdomen and pelvis 08/06/2013.  FINDINGS: CT CHEST FINDINGS  Mediastinum: Tracheostomy tube in position  with tip in the trachea immediately above the level of the aortic arch. Left internal jugular central venous catheter with tip in the mid superior vena cava. Right internal jugular central venous catheter with tip terminating in the  distal superior vena cava. Nasogastric tube extending into the antral pre-pyloric region of the stomach. Heart size is mildly enlarged. There is no significant pericardial fluid, thickening or pericardial calcification. There is atherosclerosis of the thoracic aorta, the great vessels of the mediastinum and the coronary arteries, including calcified atherosclerotic plaque in the left main, left anterior descending, left circumflex and right coronary arteries. 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 Numerous reactive size mediastinal lymph nodes. Please note that accurate exclusion of hilar adenopathy is limited on noncontrast CT scans.  Lungs/Pleura: Widespread interstitial and airspace disease throughout the lungs bilaterally, predominantly in a peribronchovascular distribution, most suggestive of severe multilobar pneumonia. Near complete atelectasis of the left lower lobe with significant passive atelectasis in the right lower lobe as well. Moderate to large bilateral pleural effusions layering dependently are simple in appearance on today's non contrast CT examination.  Musculoskeletal: Median sternotomy wires. There are no aggressive appearing lytic or blastic lesions noted in the visualized portions of the skeleton.  CT ABDOMEN AND PELVIS FINDINGS  Abdomen/Pelvis: Intermediate to high attenuation material filling the gallbladder, favored to reflect biliary sludge. No current findings to suggest acute cholecystitis at this time. The unenhanced appearance of the liver, pancreas, spleen and bilateral adrenal glands is unremarkable. Low-attenuation lesions again noted in the kidneys bilaterally, incompletely characterized on today's non contrast CT scan, largest of which measures 6.8 cm in the upper pole of the right kidney (previously characterized as simple cysts). Extensive atherosclerosis throughout the abdominal and pelvic vasculature, without evidence of aneurysm. Trace volume of  ascites. Mesenteric edema. Status post partial sigmoidectomy with left lower quadrant colostomy. Parastomal hernia containing predominantly omental fat, similar to the prior study.  Image 87 and 88 of series 2 again demonstrate what appears to be in a defect in the lateral wall of a loop of small bowel in the left lower quadrant of the abdomen (likely jejunal), through which enteric contrast extends extraluminally. This then extends along the fistulous tract toward the anterior abdominal wall, although the exact delineation of the tract is not as well demonstrated as on recent prior examination 08/13/2013. 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 Several small locules of gas are also noted in the deep subcutaneous fat of the anterior abdominal wall in the area of the fistula.  Musculoskeletal: There are no aggressive appearing lytic or blastic lesions noted in the visualized portions of the skeleton. Diffuse body wall edema similar to the prior study.  IMPRESSION: 1. Persistent evidence of enterocutaneous fistula, the origin of which appears to be a defect in the lateral wall of a loop of small bowel in the left lower quadrant (likely jejunal), as above. 2. The appearance of the lungs is most compatible with severe multilobar pneumonia. Alternatively, similar findings could be seen in the setting of ARDS. Clinical correlation is recommended. 3. Moderate to large bilateral pleural effusions with complete atelectasis of the left lower lobe and significant passive atelectasis in the right lower lobe. 4. Gallbladder likely contains a large amount of biliary sludge. No current findings to suggest acute cholecystitis at this time. 5. Multiple additional findings,  as above, similar to prior studies.   Electronically Signed   By: Trudie Reedaniel  Entrikin M.D.    On: 08/21/2013 17:59   Dg Chest Port 1 View  08/22/2013   CLINICAL DATA:  Assess pulmonary infiltrates and tracheostomy position  EXAM: PORTABLE CHEST - 1 VIEW  COMPARISON:  CT CHEST W/O CM dated 08/21/2013; DG CHEST 1V PORT dated 08/21/2013  FINDINGS: The lungs are adequately inflated. The interstitial markings remain increased bilaterally and are confluent in the right mid lung and in the retrocardiac region on the left. The left hemidiaphragm remains obscured. The cardiopericardial silhouette remains mildly enlarged. The pulmonary vascularity remains prominent centrally. The patient has undergone previous CABG.  The large caliber right internal jugular venous catheter is unchanged in position. The left internal jugular venous catheter tip also appears stable. The tracheostomy appliance tip lies of the level of the clavicular heads. The esophagogastric tube tip and proximal port project below the left hemidiaphragm.  IMPRESSION: 1. There are stable appearing pulmonary parenchymal interstitial and alveolar densities consistent with CHF. As has been previously mentioned ARDS may be developing. There has not been dramatic interval change since yesterday's study. 2. The support tubes and lines appear to be in appropriate position.   Electronically Signed   By: David  SwazilandJordan   On: 08/22/2013 07:26   Dg Chest Port 1 View  08/21/2013   CLINICAL DATA:  Hypoxia  EXAM: PORTABLE CHEST - 1 VIEW  COMPARISON:  August 20, 2013  FINDINGS: Endotracheal tube tip is 4.3 cm above the carina. Right jugular catheter tip is in the superior vena cava near the cavoatrial junction. Left central catheter tip is at the junction of the left innominate artery and superior vena cava. Nasogastric tube tip and side port are below the diaphragm. No pneumothorax. There remains extensive interstitial and patchy alveolar edema bilaterally, stable. Heart is enlarged with pulmonary venous hypertension. Patient is status post coronary artery bypass  grafting.  IMPRESSION: Cardiomegaly and pulmonary venous hypertension with interstitial and alveolar edema. These are findings consistent with congestive heart failure. A degree of underlying ARDS cannot be excluded radiographically. Tube and catheter positions as described without pneumothorax.   Electronically Signed   By: Bretta BangWilliam  Woodruff M.D.   On: 08/21/2013 07:21    Anti-infectives: Anti-infectives   Start     Dose/Rate Route Frequency Ordered Stop   08/19/13 1000  linezolid (ZYVOX) IVPB 600 mg     600 mg 300 mL/hr over 60 Minutes Intravenous Every 12 hours 08/19/13 0842     08/15/13 1200  micafungin (MYCAMINE) 100 mg in sodium chloride 0.9 % 100 mL IVPB     100 mg 100 mL/hr over 1 Hours Intravenous Daily 08/15/13 1134     08/10/13 0600  imipenem-cilastatin (PRIMAXIN) 250 mg in sodium chloride 0.9 % 100 mL IVPB     250 mg 200 mL/hr over 30 Minutes Intravenous 3 times per day 08/09/13 0021     08/09/13 1000  imipenem-cilastatin (PRIMAXIN) 250 mg in sodium chloride 0.9 % 100 mL IVPB  Status:  Discontinued     250 mg 200 mL/hr over 30 Minutes Intravenous Every 12 hours 08/09/13 0019 08/09/13 0021   08/08/13 1000  micafungin (MYCAMINE) 100 mg in sodium chloride 0.9 % 100 mL IVPB  Status:  Discontinued     100 mg 100 mL/hr over 1 Hours Intravenous Daily 08/08/13 0950 08/08/13 1001   08/28/2013 1800  vancomycin (VANCOCIN) IVPB 1000 mg/200 mL premix  Status:  Discontinued  1,000 mg 200 mL/hr over 60 Minutes Intravenous Every 24 hours 09/02/2013 1625 08/11/13 1057   09/02/2013 1700  imipenem-cilastatin (PRIMAXIN) 250 mg in sodium chloride 0.9 % 100 mL IVPB  Status:  Discontinued     250 mg 200 mL/hr over 30 Minutes Intravenous 3 times per day 09/02/2013 1624 08/09/13 0019      Assessment/Plan:   LOS: 15 days  S/p Hartman's procedure at [facility] complicated by [facility] fistula  PCM  -TPN  -fistula controlled with Eakin's pouch CT negative for abscess Prognosis poor at this point.    Palliative care consult may be helpful.   Jena Tegeler A. 08/22/2013

## 2013-08-23 LAB — GLUCOSE, CAPILLARY
GLUCOSE-CAPILLARY: 75 mg/dL (ref 70–99)
Glucose-Capillary: 63 mg/dL — ABNORMAL LOW (ref 70–99)
Glucose-Capillary: 74 mg/dL (ref 70–99)
Glucose-Capillary: 86 mg/dL (ref 70–99)

## 2013-08-23 MED ORDER — DEXTROSE 5 % IV SOLN
10.0000 mg/h | INTRAVENOUS | Status: DC
  Administered 2013-08-23: 10 mg/h via INTRAVENOUS
  Filled 2013-08-23: qty 10

## 2013-08-23 MED ORDER — ZINC TRACE METAL 1 MG/ML IV SOLN
INTRAVENOUS | Status: DC
  Filled 2013-08-23: qty 2000

## 2013-08-23 MED ORDER — MIDAZOLAM BOLUS VIA INFUSION
5.0000 mg | INTRAVENOUS | Status: DC | PRN
  Filled 2013-08-23: qty 20

## 2013-08-23 MED ORDER — SODIUM CHLORIDE 0.9 % IV SOLN
10.0000 mg/h | INTRAVENOUS | Status: DC
  Administered 2013-08-23: 10 mg/h via INTRAVENOUS
  Filled 2013-08-23: qty 10

## 2013-08-23 MED ORDER — MORPHINE BOLUS VIA INFUSION
5.0000 mg | INTRAVENOUS | Status: DC | PRN
  Filled 2013-08-23: qty 20

## 2013-08-23 MED ORDER — FAT EMULSION 20 % IV EMUL
240.0000 mL | INTRAVENOUS | Status: DC
  Filled 2013-08-23: qty 250

## 2013-08-25 LAB — CULTURE, BLOOD (ROUTINE X 2): CULTURE: NO GROWTH

## 2013-08-30 NOTE — Discharge Summary (Addendum)
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  Course was complicated by ileus, renal failure, cardiopulmonary arrest.  [facility], for further care.  The patient had been [facility], and required a tracheostomy on August 14, 2013, by Dr. Molli KnockYacoub, had a central line placed on July 20, 2013, which was discontinued on August 07, 2013, and a right IJ HD catheter placed on August 07, 2013.  Had a left IJ placement on August 07, 2013, as well. [facility] cultures that showed enterococcus faecalis from the urine MRSA, PCR was negative and urine also showed multiple bilateral morphology.  Antibiotics used throughout the hospitalization include vancomycin and imipenem and micafungin as well as linezolid with continued high pressor needs higher risk for VRE and platelet count that was tolerable.  Significant events include going to the operating room for an ex-lap on July 19, 2013, with small bowel resection, extensive lysis of adhesions, vomiting.  On July 18, 2013, the patient had cardiac arrest with vent dependent respiratory failure requiring intubation.  At [facility], she was admitted on August 07, 2013. Had a TEE showed ejection fraction 35-40% with grade 2 diastolic dysfunction.  The patient was placed on CV, EV, CVV HD renal  replacement therapy on August 08, 2013, and then the patient underwent multiple CAT scans, looking for abscesses, this either noted fistula which is draining appropriately.  As noted, the patient had tracheostomy placed on August 14, 2013.  Had a repeat CAT scan on August 21, 2013, as well as lack of progression and persistent pressor requirements and multiorgan failure.  Essentially, the patient was making zero progress, had multiorgan failure without any other surgical options and the family opted for comfort care and the patient expired.  FINAL DIAGNOSES UPON DEATH: 1. Enterocutaneous fistula. 2. Sepsis. 3. Acute renal failure. 4. Vent dependent respiratory failure status post tracheostomy. 5. Shock Liver 6. Acute Liver Failure from sepsis 7. Liver Dysfunction from septic shock      Nelda Bucksaniel J Feinstein, MD     DJF/MEDQ  D:  08/29/2013  T:  08/30/2013  Job:  [id number]

## 2013-09-04 NOTE — Progress Notes (Signed)
Chaplain responded to end of life consult.  Pt was responding, which pt son said was the "best she's been since she came here."  They family acknowledged that they are pursuing comfort care at this time with the pt.  There was an appropriate level of grief.  Chaplain shared in prayer with the family and ended the visit.  Family has own pastoral resources but know chaplain services are available if needed.

## 2013-09-04 NOTE — Progress Notes (Signed)
Pt previously on Morphine Drip 1mg /ml; 35 ml of 100 ml bag wasted in sink; Witnessed by Zenovia JordanBridget Williford, RN. Burnard BuntingKatrice Terica Yogi, RN

## 2013-09-04 NOTE — Progress Notes (Signed)
13:08 Terminally withdrew ventilator per MD order.  Pt on RA.  HR 77, RR 30.  Pt family at bedside. RT to monitor

## 2013-09-04 NOTE — Progress Notes (Addendum)
PARENTERAL NUTRITION CONSULT NOTE:  FOLLOW-UP  Pharmacy Consult:  TPN Indication: ECF  Allergies  Allergen Reactions  . Cephalexin Other (See Comments)    Unknown  . Codeine Nausea And Vomiting    Patient Measurements: Height: 5\' 1"  (154.9 cm) Weight: 143 lb 8.3 oz (65.1 kg) IBW/kg (Calculated) : 47.8  Usual Weight: 55 kg ([facility])  Vital Signs: BP: 113/39 mmHg (03/20 0700) Pulse Rate: 37 (03/20 0700) Intake/Output from previous day: 03/19 0701 - 03/20 0700 In: 3771 [I.V.:789; NG/GT:90; IV Piggyback:900; TPN:1992] Out: 4560 [Emesis/NG output:150]  Labs:  Recent Labs  08/21/13 1230 08/22/13 0032 08/22/13 0521  WBC 11.5* 10.8* 11.6*  HGB 8.6* 8.4* 8.2*  HCT 26.9* 27.1* 26.8*  PLT 103* 121* 114*     Recent Labs  08/21/13 0420 08/21/13 1500 08/22/13 0521 08/22/13 1605  NA 129* 127* 128* 129*  K 4.4 4.0 4.4 4.4  CL 95* 92* 93* 94*  CO2 22 22 23 23   GLUCOSE 120* 93 95 86  BUN 37* 36* 38* 37*  CREATININE 0.38* 0.38* 0.41* 0.41*  CALCIUM 8.2* 8.2* 8.1* 7.9*  MG 2.3  --  2.3  --   PHOS 1.8* 3.2 3.7 3.5  PROT 5.7*  --  5.7*  --   ALBUMIN 1.6* 1.7* 1.6* 1.6*  AST 386*  --  298*  --   ALT 128*  --  116*  --   ALKPHOS 364*  --  341*  --   BILITOT 7.3*  --  6.5*  --    Estimated Creatinine Clearance: 55.7 ml/min (by C-G formula based on Cr of 0.41).    Recent Labs  08/22/13 1932 04/08/14 0010 04/08/14 0404  GLUCAP 67* 86 74    Insulin Requirements in the past 24 hours:  4 units SSI + 80 units regular insulin in TPN with CBGs 105-141  Current Nutrition:  Clinimix E 5/15 at 5783ml/hr + Lipids 8610ml/hr on Mon/Fri (decreased to 2d per week to prevent essential fatty acid deficiency) provides an average of 1716 kCal and 100gm protein daily.  Nutritional Goals:  1250 kCal; protein 115 gm per day- RD note 3/18  Assessment: [age] YOF [facility] of enterocolonic fistula. Post-op course  complicated by ileus, renal failure, and cardiopulmonary arrest.  [facility] +CVVHD.  GI: 3/18 CT demonstrates persistent ECF- controlled with Eakin's pouch, no plans for resection. Prealbumin continues to decrease. TG 139. NG output 16450mL/24h. Abdomen with open wound and bilious significant drainage from fistula- wound care seeing.   Endo: No hx DM however this admission has required significant amounts of insulin, no A1C on file. CBGs now low-nl. Pt remains on stress dose steroids.  Lytes: Na 129, K 4.4, Phos 3.7, Mag 2.3, CorCa 9.8; added lytes back to TPN bag per renal request 3/18  Renal: hx CKD- CRRT since 08/08/13 for volume regulation. SCr/BUN stable. Still with increased weight with diffuse anasarca ([facility], [facility], now at 65 k).  [facility].  Remains anuric. TPN providing 2 g/kg IBW/day of protein while on CRRT- also provides ~2L fluid per day.   Pulm: [facility] - FiO2 30% (stable). S/p trach 3/11 and remains on vent support.  Cards: AoC dHF (EF 35-40%), [facility].  Still requiring Levophed and vasopressin. HR ok.   Hepatobil: LFTs elevated with slight downward trend.  Neuro: GCS 13, only on  PRN fentanyl which is being used very infrequently  Heme: Hgb and plt low but relatively stable. No overt bleeding noted. HIT panel negative  ID: Primaxin/Micafungin/Zyvox for intra-abd infxn + Enterococcus UTI (suspect VRE). WBC 11.6. Afebrile.  Best Practices: PPI IV, MC, SCDs  TPN Access: CVC placed 08/10/2013  TPN day#: PTA from [place] since 2/27   Plan:  1. Continue Clinimix E 5/15 at 22ml/hr + lipids at 10 ml/hr on Mon/Fri only to reduce total kcal. While she is receiving a higher number of kcal than RD recommendations, she requires higher amounts of protein d/t wound as well as being on CRRT. Unable to concentrate protein with premixed Clinimix bags. 2. Continue daily IV Mulitvitamin in  TPN. Since Tbili is still >6, keep trace elements out of bag- add back zinc 5mg  and selenium on MWF. Unable to add chromium as per protocol d/t shortage. 3. Decrease insulin in TPN to 40 units per bag (half of current rate). If CBGs continue to be low with current bag, will need to take current bag down and hang D10 at 80 ml/hr until new TPN bag arrives at 1800. 4. Continue moderate scale SSI per MD 5. Continue to follow family discussions for goals of care  Christoper Fabian, PharmD, BCPS Clinical pharmacist, pager [phone] 08/15/2013 7:28 AM

## 2013-09-04 NOTE — Progress Notes (Signed)
PULMONARY / CRITICAL CARE MEDICINE   Name: Leah [name] MRN: [id number] DOB: 10/03/1940  LOS 16 days    ADMISSION DATE:  08-25-2013  REFERRING MD :  [facility]  PRIMARY SERVICE: PCCM  CHIEF COMPLAINT:  Respiratory Failure   BRIEF PATIENT DESCRIPTION: 73 y/o admitted to [facility] s/p Hartman's procedure at [facility] complicated by SB fistula, now with sepsis and enterocutaneous fistula. .  Course was complicated by ileus, renal failure, cardiopulmonary arrest.  Transferred to [facility] on 3/4 for further care.   LINES / TUBES: R IJ TLC 2/14 >> 3/04 ETT([place])  2/23 >> 08/14/13, 3/11 ([place]) >> R IJ HD cath 3/04 >>  L IJ TLC 3/04 >>  ? Date aline >.3/15  CULTURES: ?date [facility]) >>> ENTEROCOCCUS FAECALIS 3/4 MRSA PCR >>> neg 3/4 Urine >>>  Multiple bacterial morphotypes  3/5 Blood >>  Neg 3/16 BC>>>  ANTIBIOTICS: Vancomycin 3/4 >> 3/08 Imipenem 3/4 >>> Mycafungin 3/12 >>> linazolid 3/16 (continued high pressors, high risk VRE, plat count noted)>>>  SIGNIFICANT EVENTS / STUDIES:  2/13  OR >>> Ex-lap with small bowel resection, extensive lysis of adhesions, vomiting 2/22  Cardiac arrest - VDRF/Intubated August 25, 2013 - ADMIT [facility] 3/05  TTE: EF 35-40, grade 2 diastolic dysfunction  3/05  CVVHD started 3/8 - No new issues. Tolerates PS 10 cm H2O 08/12/13: 40# off [facility] with [facility]. Still anuric. Patient is DNAR but full medical care. On levophed .. Family at bedside 08/13/13: No sedation but on levophed . RN says occ opens eyes but unresponsive to this MD (last fentanyl was 1aM) 08/14/13: For Trach today. WC upa at 40; worsening LLQ enteric fistula iwtih collection on CT; high risk for re-exploration per CCS. Still on levophed at . On CRRT. Is overbreathing vent; opens eyes 3/16- remains with high pressor needs 3/17 pressors are weaning down 3/18 CT abdo, pelvis, chest>>>Persistent evidence of enterocutaneous fistula, the  origin of which appears to be a defect in the lateral wall of a loop of small  bowel in the left lower quadrant (likely jejunal), as above. 3/18 ct head>>>neg acute 3/19- pressors increased  SUBJECTIVE/OVERNIGHT/INTERVAL HX Active d/w husband for comfort care / plan of care Remains in shock  VITAL SIGNS: Temp:  [97.4 F (36.3 C)-98 F (36.7 C)] 98 F (36.7 C) (03/19 1922) Pulse Rate:  [37-85] 37 (03/20 0700) Resp:  [11-26] 22 (03/20 0700) BP: (95-127)/(37-73) 113/39 mmHg (03/20 0700) SpO2:  [98 %-100 %] 100 % (03/20 0700) FiO2 (%):  [40 %] 40 % (03/20 0808) Weight:  [65.1 kg (143 lb 8.3 oz)] 65.1 kg (143 lb 8.3 oz) (03/20 0600)  HEMODYNAMICS:   VENTILATOR SETTINGS: Vent Mode:  [-] PRVC FiO2 (%):  [40 %] 40 % Set Rate:  [16 bmp] 16 bmp Vt Set:  [380 mL] 380 mL PEEP:  [5 cmH20] 5 cmH20 Plateau Pressure:  [11 cmH20-18 cmH20] 11 cmH20  INTAKE / OUTPUT: Intake/Output     03/19 0701 - 03/20 0700 03/20 0701 - 03/21 0700   I.V. (mL/kg) 789 (12.1) 66.2 (1)   Other     NG/GT 90 30   IV Piggyback 900    TPN 1992 166   Total Intake(mL/kg) 3771 (57.9) 262.2 (4)   Emesis/NG output 150 0   Drains 0 0   Other 4410 180   Total Output 4560 180   Net -789 +82.2         PHYSICAL EXAMINATION: General:  Weak, Critically ill looking  Neuro: Diffusely weak, rass -1 remains HEENT: WNL Cardiovascular:  RRR s M Lungs: coarse, diminished bases Abdomen:  BS absent, LLQ colostomy, RLQ fistula, open wound clean appearing, edema on wall Ext:  Edema noted 3  PULMONARY  Recent Labs Lab 08/16/13 1113 08/20/13 0520  PHART  --  7.366  PCO2ART  --  40.2  PO2ART  --  55.0*  HCO3  --  23.2  TCO2  --  24  O2SAT 61.9 88.0    CBC  Recent Labs Lab 08/21/13 1230 08/22/13 0032 08/22/13 0521  HGB 8.6* 8.4* 8.2*  HCT 26.9* 27.1* 26.8*  WBC 11.5* 10.8* 11.6*  PLT 103* 121* 114*    COAGULATION No results found for this basename: INR,  in the last 168 hours  CARDIAC    Recent  Labs Lab 08/16/13 1200 08/16/13 1838 08/17/13 0238  TROPONINI 0.62* 0.54* 0.79*   No results found for this basename: PROBNP,  in the last 168 hours   CHEMISTRY  Recent Labs Lab 08/18/13 0510  08/19/13 0516  08/20/13 0508 08/20/13 1300 08/21/13 0420 08/21/13 1500 08/22/13 0521 08/22/13 1605  NA 126*  < > 129*  < > 135* 131* 129* 127* 128* 129*  K 4.9  < > 5.0  < > 3.5* 5.0 4.4 4.0 4.4 4.4  CL 93*  < > 95*  < > 104 97 95* 92* 93* 94*  CO2 21  < > 24  < > 20 24 22 22 23 23   GLUCOSE 151*  < > 116*  < > 94 141* 120* 93 95 86  BUN 32*  < > 37*  < > 33* 38* 37* 36* 38* 37*  CREATININE 0.35*  < > 0.40*  < > 0.34* 0.44* 0.38* 0.38* 0.41* 0.41*  CALCIUM 8.1*  < > 8.5  < > 6.8* 7.7* 8.2* 8.2* 8.1* 7.9*  MG 2.2  --  2.5  --  1.9  --  2.3  --  2.3  --   PHOS 2.7  < > 3.6  < > 2.1* 2.3 1.8* 3.2 3.7 3.5  < > = values in this interval not displayed. Estimated Creatinine Clearance: 55.7 ml/min (by C-G formula based on Cr of 0.41).   LIVER  Recent Labs Lab 08/19/13 0516 08/19/13 0930  08/20/13 0508 08/20/13 1300 08/21/13 0420 08/21/13 1500 08/22/13 0521 08/22/13 1605  AST 258*  --   --  294*  --  386*  --  298*  --   ALT 77*  --   --  89*  --  128*  --  116*  --   ALKPHOS 326*  --   --  284*  --  364*  --  341*  --   BILITOT 7.9* 7.9*  --  6.3*  --  7.3*  --  6.5*  --   PROT 5.5*  --   --  4.4*  --  5.7*  --  5.7*  --   ALBUMIN 1.5*  --   < > 1.2* 1.4* 1.6* 1.7* 1.6* 1.6*  < > = values in this interval not displayed.   INFECTIOUS No results found for this basename: LATICACIDVEN, PROCALCITON,  in the last 168 hours   ENDOCRINE CBG (last 3)   Recent Labs  08/12/2013 0010 08/17/2013 0404 08/22/2013 0810  GLUCAP 86 74 63*    IMAGING x48h  Ct Abdomen Pelvis Wo Contrast  08/21/2013   CLINICAL DATA:  Evaluate for abscess.  EXAM: CT CHEST, ABDOMEN AND PELVIS  WITHOUT CONTRAST  TECHNIQUE: Multidetector CT imaging of the chest, abdomen and pelvis was performed following the  standard protocol without IV contrast.  COMPARISON:  CT of the abdomen and pelvis 08/06/2013.  FINDINGS: CT CHEST FINDINGS  Mediastinum: Tracheostomy tube in position with tip in the trachea immediately above the level of the aortic arch. Left internal jugular central venous catheter with tip in the mid superior vena cava. Right internal jugular central venous catheter with tip terminating in the distal superior vena cava. Nasogastric tube extending into the antral pre-pyloric region of the stomach. Heart size is mildly enlarged. There is no significant pericardial fluid, thickening or pericardial calcification. There is atherosclerosis of the thoracic aorta, the great vessels of the mediastinum and the coronary arteries, including calcified atherosclerotic plaque in the left main, left anterior descending, left circumflex and right coronary arteries. 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 Numerous reactive size mediastinal lymph nodes. Please note that accurate exclusion of hilar adenopathy is limited on noncontrast CT scans.  Lungs/Pleura: Widespread interstitial and airspace disease throughout the lungs bilaterally, predominantly in a peribronchovascular distribution, most suggestive of severe multilobar pneumonia. Near complete atelectasis of the left lower lobe with significant passive atelectasis in the right lower lobe as well. Moderate to large bilateral pleural effusions layering dependently are simple in appearance on today's non contrast CT examination.  Musculoskeletal: Median sternotomy wires. There are no aggressive appearing lytic or blastic lesions noted in the visualized portions of the skeleton.  CT ABDOMEN AND PELVIS FINDINGS  Abdomen/Pelvis: Intermediate to high attenuation material filling the gallbladder, favored to reflect biliary sludge. No current findings to suggest acute cholecystitis at this time. The unenhanced appearance of the liver, pancreas, spleen and  bilateral adrenal glands is unremarkable. Low-attenuation lesions again noted in the kidneys bilaterally, incompletely characterized on today's non contrast CT scan, largest of which measures 6.8 cm in the upper pole of the right kidney (previously characterized as simple cysts). Extensive atherosclerosis throughout the abdominal and pelvic vasculature, without evidence of aneurysm. Trace volume of ascites. Mesenteric edema. Status post partial sigmoidectomy with left lower quadrant colostomy. Parastomal hernia containing predominantly omental fat, similar to the prior study.  Image 87 and 88 of series 2 again demonstrate what appears to be in a defect in the lateral wall of a loop of small bowel in the left lower quadrant of the abdomen (likely jejunal), through which enteric contrast extends extraluminally. This then extends along the fistulous tract toward the anterior abdominal wall, although the exact delineation of the tract is not as well demonstrated as on recent prior examination 08/13/2013. 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 Several small locules of gas are also noted in the deep subcutaneous fat of the anterior abdominal wall in the area of the fistula.  Musculoskeletal: There are no aggressive appearing lytic or blastic lesions noted in the visualized portions of the skeleton. Diffuse body wall edema similar to the prior study.  IMPRESSION: 1. Persistent evidence of enterocutaneous fistula, the origin of which appears to be a defect in the lateral wall of a loop of small bowel in the left lower quadrant (likely jejunal), as above. 2. The appearance of the lungs is most compatible with severe multilobar pneumonia. Alternatively, similar findings could be seen in the setting of ARDS. Clinical correlation is recommended. 3.  Moderate to large  bilateral pleural effusions with complete atelectasis of the left lower lobe and significant passive atelectasis in the right lower lobe. 4. Gallbladder likely contains a large amount of biliary sludge. No current findings to suggest acute cholecystitis at this time. 5. Multiple additional findings, as above, similar to prior studies.   Electronically Signed   By: Trudie Reed M.D.   On: 08/21/2013 17:59   Ct Head Wo Contrast  08/21/2013   CLINICAL DATA:  Evaluate stroke.  EXAM: CT HEAD WITHOUT CONTRAST  TECHNIQUE: Contiguous axial images were obtained from the base of the skull through the vertex without intravenous contrast.  COMPARISON:  CT PORT HEAD WO CM dated 08/15/2013  FINDINGS: There is no evidence of mass effect, midline shift, or extra-axial fluid collections. There is no evidence of a space-occupying lesion or intracranial hemorrhage. There is no evidence of a cortical-based area of acute infarction. There is an old left cerebellar infarct. There is generalized cerebral atrophy. There is periventricular white matter low attenuation likely secondary to microangiopathy.  The ventricles and sulci are appropriate for the patient's age. The basal cisterns are patent.  Visualized portions of the orbits are unremarkable. There is opacification of bilateral mastoid air cells, right greater than left. Cerebrovascular atherosclerotic calcifications are noted.  The osseous structures are unremarkable.  IMPRESSION: 1. No acute intracranial pathology. 2. Complete opacification of the right mastoid air cells and partial left mastoid opacification.   Electronically Signed   By: Elige Ko   On: 08/21/2013 17:39   Ct Chest Wo Contrast  08/21/2013   CLINICAL DATA:  Evaluate for abscess.  EXAM: CT CHEST, ABDOMEN AND PELVIS WITHOUT CONTRAST  TECHNIQUE: Multidetector CT imaging of the chest, abdomen and pelvis was performed following the standard protocol without IV contrast.  COMPARISON:  CT of  the abdomen and pelvis 08/06/2013.  FINDINGS: CT CHEST FINDINGS  Mediastinum: Tracheostomy tube in position with tip in the trachea immediately above the level of the aortic arch. Left internal jugular central venous catheter with tip in the mid superior vena cava. Right internal jugular central venous catheter with tip terminating in the distal superior vena cava. Nasogastric tube extending into the antral pre-pyloric region of the stomach. Heart size is mildly enlarged. There is no significant pericardial fluid, thickening or pericardial calcification. There is atherosclerosis of the thoracic aorta, the great vessels of the mediastinum and the coronary arteries, including calcified atherosclerotic plaque in the left main, left anterior descending, left circumflex and right coronary arteries. 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 Numerous reactive size mediastinal lymph nodes. Please note that accurate exclusion of hilar adenopathy is limited on noncontrast CT scans.  Lungs/Pleura: Widespread interstitial and airspace disease throughout the lungs bilaterally, predominantly in a peribronchovascular distribution, most suggestive of severe multilobar pneumonia. Near complete atelectasis of the left lower lobe with significant passive atelectasis in the right lower lobe as well. Moderate to large bilateral pleural effusions layering dependently are simple in appearance on today's non contrast CT examination.  Musculoskeletal: Median sternotomy wires. There are no aggressive appearing lytic or blastic lesions noted in the visualized portions of the skeleton.  CT ABDOMEN AND PELVIS FINDINGS  Abdomen/Pelvis: Intermediate to high attenuation material filling the gallbladder, favored to reflect biliary sludge. No current findings to suggest acute cholecystitis at this time. The unenhanced appearance of the liver, pancreas, spleen and bilateral adrenal glands is unremarkable. Low-attenuation  lesions again noted in the kidneys bilaterally, incompletely characterized on today's non contrast  CT scan, largest of which measures 6.8 cm in the upper pole of the right kidney (previously characterized as simple cysts). Extensive atherosclerosis throughout the abdominal and pelvic vasculature, without evidence of aneurysm. Trace volume of ascites. Mesenteric edema. Status post partial sigmoidectomy with left lower quadrant colostomy. Parastomal hernia containing predominantly omental fat, similar to the prior study.  Image 87 and 88 of series 2 again demonstrate what appears to be in a defect in the lateral wall of a loop of small bowel in the left lower quadrant of the abdomen (likely jejunal), through which enteric contrast extends extraluminally. This then extends along the fistulous tract toward the anterior abdominal wall, although the exact delineation of the tract is not as well demonstrated as on recent prior examination 08/13/2013. 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 Several small locules of gas are also noted in the deep subcutaneous fat of the anterior abdominal wall in the area of the fistula.  Musculoskeletal: There are no aggressive appearing lytic or blastic lesions noted in the visualized portions of the skeleton. Diffuse body wall edema similar to the prior study.  IMPRESSION: 1. Persistent evidence of enterocutaneous fistula, the origin of which appears to be a defect in the lateral wall of a loop of small bowel in the left lower quadrant (likely jejunal), as above. 2. The appearance of the lungs is most compatible with severe multilobar pneumonia. Alternatively, similar findings could be seen in the setting of ARDS. Clinical correlation is recommended. 3. Moderate to large bilateral pleural effusions with complete  atelectasis of the left lower lobe and significant passive atelectasis in the right lower lobe. 4. Gallbladder likely contains a large amount of biliary sludge. No current findings to suggest acute cholecystitis at this time. 5. Multiple additional findings, as above, similar to prior studies.   Electronically Signed   By: Trudie Reed M.D.   On: 08/21/2013 17:59   Dg Chest Port 1 View  08/22/2013   CLINICAL DATA:  Assess pulmonary infiltrates and tracheostomy position  EXAM: PORTABLE CHEST - 1 VIEW  COMPARISON:  CT CHEST W/O CM dated 08/21/2013; DG CHEST 1V PORT dated 08/21/2013  FINDINGS: The lungs are adequately inflated. The interstitial markings remain increased bilaterally and are confluent in the right mid lung and in the retrocardiac region on the left. The left hemidiaphragm remains obscured. The cardiopericardial silhouette remains mildly enlarged. The pulmonary vascularity remains prominent centrally. The patient has undergone previous CABG.  The large caliber right internal jugular venous catheter is unchanged in position. The left internal jugular venous catheter tip also appears stable. The tracheostomy appliance tip lies of the level of the clavicular heads. The esophagogastric tube tip and proximal port project below the left hemidiaphragm.  IMPRESSION: 1. There are stable appearing pulmonary parenchymal interstitial and alveolar densities consistent with CHF. As has been previously mentioned ARDS may be developing. There has not been dramatic interval change since yesterday's study. 2. The support tubes and lines appear to be in appropriate position.   Electronically Signed   By: David  [place]   On: 08/22/2013 07:26    ASSESSMENT / PLAN:  PULMONARY A: Acute and chronic respiratory failure with prolonged mech vent/chronic critical illness  - s/p trach 08/14/13 COPD without exacerbation LLL PNA on CT abd 08/13/13 Large effusions, compressive atx P:   Wean PS 18-20, fails less than  this  continue neg 1 liter daily  CARDIOVASCULAR A:  Septic shock Cardiogenic shock S/p Cardiopulmonary arrest Acute on chronic systolic and diastolic CHF NSTEMI 2/23 [facility]  P:  Levophed to MAP goal 60 maintain stress steroids at 50mg  q 6 h until off pressors Keep neg balance Dc vaso , has made no change Max levo 25 mics   RENAL A:   ARF, hyponatremia  P:   CRRT per Nephrology Did well overall with neg 1 liter, repeat this  GASTROINTESTINAL A:  S/p HArtmans procedure Enterocolonic fistula  Protein calorie malnutrition Cholestasis confirmed by bili CT neg new abscess P:   SUP: IV PPI Cont TPN Surgery following- I am in active discussions with husband to dc support for comfort  HEMATOLOGIC A:   Anemia s/p PRBC 08/17/13 Mild thrombocytopenia, extensive hep exposure Plt 103 HIT neg P:  DVT px: SCDs Cont linazolid, watch plat count further if maintain this drug cb cin am   INFECTIOUS A:   Septic shock persists in setting of complicated GI surgery UTI P:   CT abdo /pelvis / chest - reviewed, no abscess No vre, dclinazolid Repeat BC - neg Continue imipenem, consider 21 days,will add stop date caspo empiric, add stop date 8 days, allow to dc today  ENDOCRINE A:   Hyperglycemia 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  CT head negative for acute abnormalities  P:   Goal RASS 0 to -1 Cont PRN fentanyl ct reviewed Will have 3rd meeting with husband for comfort plans today  I have fully examined this patient and agree with above findings.    And edited in full  Ccm time 30 min Will rediscuss consideration comfort  Mcarthur Rossettianiel J. Tyson AliasFeinstein, MD, FACP Pgr: [phone] [facility]

## 2013-09-04 NOTE — Progress Notes (Signed)
Patient ID: Leah [name], female   DOB: 1940-08-15, 73 y.o.   MRN: [id number]   [facility] Progress Note    Assessment/ Plan:   1 Anuric AKI: From ischemic/sepsis associated ATN-- continue CRRT for high daily fluid load from TPN and catabolic rate from sepsis. Continue UF of 15mL/hr (challenging as she remains on dual pressors). Overall prognosis appears poor with dense renal injury and multi-organ failure  2. Status post Hartman's procedure at [facility] complicated by small bowel fistula, enterocutaneous fistula and sepsis-with septic shock and on broad-spectrum antibiotics. 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  3 Volume overload: Volume overloaded with difficulty in ultrafiltration due to ongoing pressor needs.  4 Hyponatremia:From hypotonic TPN and due to limitations with UF   Subjective:   No acute events overnight-- plans to meet again with family today to revisit GOC    Objective:   BP 113/39  Pulse 37  Temp(Src) 98 F (36.7 C) (Oral)  Resp 22  Ht 5\' 1"  (1.549 m)  Wt 65.1 kg (143 lb 8.3 oz)  BMI 27.13 kg/m2  SpO2 100%  Intake/Output Summary (Last 24 hours) at 08/29/2013 0733 Last data filed at 08/05/2013 0700  Gross per 24 hour  Intake   3771 ml  Output   4560 ml  Net   -789 ml   Weight change: 1.5 kg (3 lb 4.9 oz)  Physical Exam: WUJ:[id number] resting on vent via trach AOZ:HYQMV RRR, normal s1 and s2 Resp:Coarse bilaterally, no rales HQI:ONGE, obese, open abdominal wound drain Ext:2+ LE edema  Imaging: Ct Abdomen Pelvis Wo Contrast  08/21/2013   CLINICAL DATA:  Evaluate for abscess.  EXAM: CT CHEST, ABDOMEN AND PELVIS WITHOUT CONTRAST  TECHNIQUE: Multidetector CT imaging of the chest, abdomen and pelvis was performed following the standard protocol without IV contrast.  COMPARISON:  CT of the abdomen and pelvis 08/06/2013.  FINDINGS: CT CHEST FINDINGS  Mediastinum: Tracheostomy tube  in position with tip in the trachea immediately above the level of the aortic arch. Left internal jugular central venous catheter with tip in the mid superior vena cava. Right internal jugular central venous catheter with tip terminating in the distal superior vena cava. Nasogastric tube extending into the antral pre-pyloric region of the stomach. Heart size is mildly enlarged. There is no significant pericardial fluid, thickening or pericardial calcification. There is atherosclerosis of the thoracic aorta, the great vessels of the mediastinum and the coronary arteries, including calcified atherosclerotic plaque in the left main, left anterior descending, left circumflex and right coronary arteries. 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 Numerous reactive size mediastinal lymph nodes. Please note that accurate exclusion of hilar adenopathy is limited on noncontrast CT scans.  Lungs/Pleura: Widespread interstitial and airspace disease throughout the lungs bilaterally, predominantly in a peribronchovascular distribution, most suggestive of severe multilobar pneumonia. Near complete atelectasis of the left lower lobe with significant passive atelectasis in the right lower lobe as well. Moderate to large bilateral pleural effusions layering dependently are simple in appearance on today's non contrast CT examination.  Musculoskeletal: Median sternotomy wires. There are no aggressive appearing lytic or blastic lesions noted in the visualized portions of the skeleton.  CT ABDOMEN AND PELVIS FINDINGS  Abdomen/Pelvis: Intermediate to high attenuation material filling the gallbladder, favored to reflect biliary sludge. No current findings to suggest acute cholecystitis at this time. The unenhanced appearance of the liver, pancreas, spleen and bilateral adrenal glands is unremarkable.  Low-attenuation lesions again noted in the kidneys bilaterally, incompletely characterized on today's non contrast  CT scan, largest of which measures 6.8 cm in the upper pole of the right kidney (previously characterized as simple cysts). Extensive atherosclerosis throughout the abdominal and pelvic vasculature, without evidence of aneurysm. Trace volume of ascites. Mesenteric edema. Status post partial sigmoidectomy with left lower quadrant colostomy. Parastomal hernia containing predominantly omental fat, similar to the prior study.  Image 87 and 88 of series 2 again demonstrate what appears to be in a defect in the lateral wall of a loop of small bowel in the left lower quadrant of the abdomen (likely jejunal), through which enteric contrast extends extraluminally. This then extends along the fistulous tract toward the anterior abdominal wall, although the exact delineation of the tract is not as well demonstrated as on recent prior examination 08/13/2013. 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 Several small locules of gas are also noted in the deep subcutaneous fat of the anterior abdominal wall in the area of the fistula.  Musculoskeletal: There are no aggressive appearing lytic or blastic lesions noted in the visualized portions of the skeleton. Diffuse body wall edema similar to the prior study.  IMPRESSION: 1. Persistent evidence of enterocutaneous fistula, the origin of which appears to be a defect in the lateral wall of a loop of small bowel in the left lower quadrant (likely jejunal), as above. 2. The appearance of the lungs is most compatible with severe multilobar pneumonia. Alternatively, similar findings could be seen in the setting of ARDS. Clinical correlation is recommended. 3. Moderate to large bilateral pleural effusions with complete atelectasis of the left lower lobe and significant passive atelectasis in the right lower lobe. 4.  Gallbladder likely contains a large amount of biliary sludge. No current findings to suggest acute cholecystitis at this time. 5. Multiple additional findings, as above, similar to prior studies.   Electronically Signed   By: Trudie Reed M.D.   On: 08/21/2013 17:59   Ct Head Wo Contrast  08/21/2013   CLINICAL DATA:  Evaluate stroke.  EXAM: CT HEAD WITHOUT CONTRAST  TECHNIQUE: Contiguous axial images were obtained from the base of the skull through the vertex without intravenous contrast.  COMPARISON:  CT PORT HEAD WO CM dated 08/15/2013  FINDINGS: There is no evidence of mass effect, midline shift, or extra-axial fluid collections. There is no evidence of a space-occupying lesion or intracranial hemorrhage. There is no evidence of a cortical-based area of acute infarction. There is an old left cerebellar infarct. There is generalized cerebral atrophy. There is periventricular white matter low attenuation likely secondary to microangiopathy.  The ventricles and sulci are appropriate for the patient's age. The basal cisterns are patent.  Visualized portions of the orbits are unremarkable. There is opacification of bilateral mastoid air cells, right greater than left. Cerebrovascular atherosclerotic calcifications are noted.  The osseous structures are unremarkable.  IMPRESSION: 1. No acute intracranial pathology. 2. Complete opacification of the right mastoid air cells and partial left mastoid opacification.   Electronically Signed   By: Elige Ko   On: 08/21/2013 17:39   Ct Chest Wo Contrast  08/21/2013   CLINICAL DATA:  Evaluate for abscess.  EXAM: CT CHEST, ABDOMEN AND PELVIS WITHOUT CONTRAST  TECHNIQUE: Multidetector CT imaging of the chest, abdomen and pelvis was performed following the standard protocol without IV  contrast.  COMPARISON:  CT of the abdomen and pelvis 08/06/2013.  FINDINGS: CT CHEST FINDINGS  Mediastinum: Tracheostomy tube in position with tip in the trachea immediately above the  level of the aortic arch. Left internal jugular central venous catheter with tip in the mid superior vena cava. Right internal jugular central venous catheter with tip terminating in the distal superior vena cava. Nasogastric tube extending into the antral pre-pyloric region of the stomach. Heart size is mildly enlarged. There is no significant pericardial fluid, thickening or pericardial calcification. There is atherosclerosis of the thoracic aorta, the great vessels of the mediastinum and the coronary arteries, including calcified atherosclerotic plaque in the left main, left anterior descending, left circumflex and right coronary arteries. 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 Numerous reactive size mediastinal lymph nodes. Please note that accurate exclusion of hilar adenopathy is limited on noncontrast CT scans.  Lungs/Pleura: Widespread interstitial and airspace disease throughout the lungs bilaterally, predominantly in a peribronchovascular distribution, most suggestive of severe multilobar pneumonia. Near complete atelectasis of the left lower lobe with significant passive atelectasis in the right lower lobe as well. Moderate to large bilateral pleural effusions layering dependently are simple in appearance on today's non contrast CT examination.  Musculoskeletal: Median sternotomy wires. There are no aggressive appearing lytic or blastic lesions noted in the visualized portions of the skeleton.  CT ABDOMEN AND PELVIS FINDINGS  Abdomen/Pelvis: Intermediate to high attenuation material filling the gallbladder, favored to reflect biliary sludge. No current findings to suggest acute cholecystitis at this time. The unenhanced appearance of the liver, pancreas, spleen and bilateral adrenal glands is unremarkable. Low-attenuation lesions again noted in the kidneys bilaterally, incompletely characterized on today's non contrast CT scan, largest of which measures 6.8 cm in the upper pole  of the right kidney (previously characterized as simple cysts). Extensive atherosclerosis throughout the abdominal and pelvic vasculature, without evidence of aneurysm. Trace volume of ascites. Mesenteric edema. Status post partial sigmoidectomy with left lower quadrant colostomy. Parastomal hernia containing predominantly omental fat, similar to the prior study.  Image 87 and 88 of series 2 again demonstrate what appears to be in a defect in the lateral wall of a loop of small bowel in the left lower quadrant of the abdomen (likely jejunal), through which enteric contrast extends extraluminally. This then extends along the fistulous tract toward the anterior abdominal wall, although the exact delineation of the tract is not as well demonstrated as on recent prior examination 08/13/2013. 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 Several small locules of gas are also noted in the deep subcutaneous fat of the anterior abdominal wall in the area of the fistula.  Musculoskeletal: There are no aggressive appearing lytic or blastic lesions noted in the visualized portions of the skeleton. Diffuse body wall edema similar to the prior study.  IMPRESSION: 1. Persistent evidence of enterocutaneous fistula, the origin of which appears to be a defect in the lateral wall of a loop of small bowel in the left lower quadrant (likely jejunal), as above. 2. The appearance of the lungs is most compatible with severe multilobar pneumonia. Alternatively, similar findings could be seen in the setting of ARDS. Clinical correlation is recommended. 3. Moderate to large bilateral pleural effusions with complete atelectasis of the left lower lobe and significant passive atelectasis in the right lower lobe. 4. Gallbladder  likely contains a large amount of biliary sludge.  No current findings to suggest acute cholecystitis at this time. 5. Multiple additional findings, as above, similar to prior studies.   Electronically Signed   By: Trudie Reedaniel  Entrikin M.D.   On: 08/21/2013 17:59   Dg Chest Port 1 View  08/22/2013   CLINICAL DATA:  Assess pulmonary infiltrates and tracheostomy position  EXAM: PORTABLE CHEST - 1 VIEW  COMPARISON:  CT CHEST W/O CM dated 08/21/2013; DG CHEST 1V PORT dated 08/21/2013  FINDINGS: The lungs are adequately inflated. The interstitial markings remain increased bilaterally and are confluent in the right mid lung and in the retrocardiac region on the left. The left hemidiaphragm remains obscured. The cardiopericardial silhouette remains mildly enlarged. The pulmonary vascularity remains prominent centrally. The patient has undergone previous CABG.  The large caliber right internal jugular venous catheter is unchanged in position. The left internal jugular venous catheter tip also appears stable. The tracheostomy appliance tip lies of the level of the clavicular heads. The esophagogastric tube tip and proximal port project below the left hemidiaphragm.  IMPRESSION: 1. There are stable appearing pulmonary parenchymal interstitial and alveolar densities consistent with CHF. As has been previously mentioned ARDS may be developing. There has not been dramatic interval change since yesterday's study. 2. The support tubes and lines appear to be in appropriate position.   Electronically Signed   By: David  SwazilandJordan   On: 08/22/2013 07:26    Labs: BMET  Recent Labs Lab 08/19/13 1631 08/20/13 0508 08/20/13 1300 08/21/13 0420 08/21/13 1500 08/22/13 0521 08/22/13 1605  NA 130* 135* 131* 129* 127* 128* 129*  K 5.2 3.5* 5.0 4.4 4.0 4.4 4.4  CL 95* 104 97 95* 92* 93* 94*  CO2 25 20 24 22 22 23 23   GLUCOSE 99 94 141* 120* 93 95 86  BUN 40* 33* 38* 37* 36* 38* 37*  CREATININE 0.43* 0.34* 0.44* 0.38* 0.38* 0.41* 0.41*  CALCIUM 8.6 6.8* 7.7* 8.2* 8.2* 8.1* 7.9*   PHOS 4.7* 2.1* 2.3 1.8* 3.2 3.7 3.5   CBC  Recent Labs Lab 08/18/13 0510 08/19/13 0516 08/20/13 0508  08/21/13 0420 08/21/13 1230 08/22/13 0032 08/22/13 0521  WBC 16.4* 16.0* 11.5*  < > 12.2* 11.5* 10.8* 11.6*  NEUTROABS 14.9* 14.1* 9.7*  --   --   --   --  9.8*  HGB 8.3* 8.2* 6.7*  < > 8.3* 8.6* 8.4* 8.2*  HCT 25.7* 25.7* 21.3*  < > 26.9* 26.9* 27.1* 26.8*  MCV 95.2 97.3 100.5*  < > 102.3* 101.9* 103.4* 103.1*  PLT 48* 59* 58*  < > 95* 103* 121* 114*  < > = values in this interval not displayed.  Medications:    . antiseptic oral rinse  15 mL Mouth Rinse QID  . chlorhexidine  15 mL Mouth Rinse BID  . hydrocortisone sod succinate (SOLU-CORTEF) inj  50 mg Intravenous Q6H  . imipenem-cilastatin  250 mg Intravenous 3 times per day  . insulin aspart  0-15 Units Subcutaneous 6 times per day  . linezolid  600 mg Intravenous Q12H  . micafungin (MYCAMINE) IV  100 mg Intravenous Daily  . pantoprazole (PROTONIX) IV  40 mg Intravenous Q24H  . sodium chloride  2,000 mL Intravenous Once   Zetta BillsJay Franny Selvage, MD 04/26/2014, 7:33 AM

## 2013-09-04 NOTE — Progress Notes (Signed)
Pt asystolic in ekg leads x 2; no spontaneous respirations noted; no apical heart tones; pupils fixed and dilated; findings verified with Gerre PebblesMavis Nyako, RN; Dr Tyson AliasFeinstein notified at 1507; Family at bedside; No belongings in room with patient.  Burnard BuntingKatrice Laquetta Racey, RN

## 2013-09-04 NOTE — Progress Notes (Signed)
Subjective: Eyes open but not responsive.  On vent and HD  Objective: Vital signs in last 24 hours: Temp:  [97.4 F (36.3 C)-98.7 F (37.1 C)] 98 F (36.7 C) (03/19 1922) Pulse Rate:  [37-88] 37 (03/20 0700) Resp:  [11-26] 22 (03/20 0700) BP: (95-127)/(37-80) 113/39 mmHg (03/20 0700) SpO2:  [98 %-100 %] 100 % (03/20 0700) FiO2 (%):  [40 %] 40 % (03/20 0327) Weight:  [143 lb 8.3 oz (65.1 kg)] 143 lb 8.3 oz (65.1 kg) (03/20 0600) Last BM Date: 08/14/13  Intake/Output from previous day: 03/19 0701 - 03/20 0700 In: 3771 [I.V.:789; NG/GT:90; IV Piggyback:900; TPN:1992] Out: 4560 [Emesis/NG output:150] Intake/Output this shift:    Incision/Wound:Eakins pouch in place.  Ostomy  Alive and viable .  Staples out.   Lab Results:   Recent Labs  08/22/13 0032 08/22/13 0521  WBC 10.8* 11.6*  HGB 8.4* 8.2*  HCT 27.1* 26.8*  PLT 121* 114*   BMET  Recent Labs  08/22/13 0521 08/22/13 1605  NA 128* 129*  K 4.4 4.4  CL 93* 94*  CO2 23 23  GLUCOSE 95 86  BUN 38* 37*  CREATININE 0.41* 0.41*  CALCIUM 8.1* 7.9*   PT/INR No results found for this basename: LABPROT, INR,  in the last 72 hours ABG No results found for this basename: PHART, PCO2, PO2, HCO3,  in the last 72 hours  Studies/Results: Ct Abdomen Pelvis Wo Contrast  08/21/2013   CLINICAL DATA:  Evaluate for abscess.  EXAM: CT CHEST, ABDOMEN AND PELVIS WITHOUT CONTRAST  TECHNIQUE: Multidetector CT imaging of the chest, abdomen and pelvis was performed following the standard protocol without IV contrast.  COMPARISON:  CT of the abdomen and pelvis 08/06/2013.  FINDINGS: CT CHEST FINDINGS  Mediastinum: Tracheostomy tube in position with tip in the trachea immediately above the level of the aortic arch. Left internal jugular central venous catheter with tip in the mid superior vena cava. Right internal jugular central venous catheter with tip terminating in the distal superior vena cava. Nasogastric tube extending into the  antral pre-pyloric region of the stomach. Heart size is mildly enlarged. There is no significant pericardial fluid, thickening or pericardial calcification. There is atherosclerosis of the thoracic aorta, the great vessels of the mediastinum and the coronary arteries, including calcified atherosclerotic plaque in the left main, left anterior descending, left circumflex and right coronary arteries. 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 Numerous reactive size mediastinal lymph nodes. Please note that accurate exclusion of hilar adenopathy is limited on noncontrast CT scans.  Lungs/Pleura: Widespread interstitial and airspace disease throughout the lungs bilaterally, predominantly in a peribronchovascular distribution, most suggestive of severe multilobar pneumonia. Near complete atelectasis of the left lower lobe with significant passive atelectasis in the right lower lobe as well. Moderate to large bilateral pleural effusions layering dependently are simple in appearance on today's non contrast CT examination.  Musculoskeletal: Median sternotomy wires. There are no aggressive appearing lytic or blastic lesions noted in the visualized portions of the skeleton.  CT ABDOMEN AND PELVIS FINDINGS  Abdomen/Pelvis: Intermediate to high attenuation material filling the gallbladder, favored to reflect biliary sludge. No current findings to suggest acute cholecystitis at this time. The unenhanced appearance of the liver, pancreas, spleen and bilateral adrenal glands is unremarkable. Low-attenuation lesions again noted in the kidneys bilaterally, incompletely characterized on today's non contrast CT scan, largest of which measures 6.8 cm in the upper pole of the right kidney (previously characterized as simple cysts).  Extensive atherosclerosis throughout the abdominal and pelvic vasculature, without evidence of aneurysm. Trace volume of ascites. Mesenteric edema. Status post partial sigmoidectomy  with left lower quadrant colostomy. Parastomal hernia containing predominantly omental fat, similar to the prior study.  Image 87 and 88 of series 2 again demonstrate what appears to be in a defect in the lateral wall of a loop of small bowel in the left lower quadrant of the abdomen (likely jejunal), through which enteric contrast extends extraluminally. This then extends along the fistulous tract toward the anterior abdominal wall, although the exact delineation of the tract is not as well demonstrated as on recent prior examination 08/13/2013. 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 Several small locules of gas are also noted in the deep subcutaneous fat of the anterior abdominal wall in the area of the fistula.  Musculoskeletal: There are no aggressive appearing lytic or blastic lesions noted in the visualized portions of the skeleton. Diffuse body wall edema similar to the prior study.  IMPRESSION: 1. Persistent evidence of enterocutaneous fistula, the origin of which appears to be a defect in the lateral wall of a loop of small bowel in the left lower quadrant (likely jejunal), as above. 2. The appearance of the lungs is most compatible with severe multilobar pneumonia. Alternatively, similar findings could be seen in the setting of ARDS. Clinical correlation is recommended. 3. Moderate to large bilateral pleural effusions with complete atelectasis of the left lower lobe and significant passive atelectasis in the right lower lobe. 4. Gallbladder likely contains a large amount of biliary sludge. No current findings to suggest acute cholecystitis at this time. 5. Multiple additional findings, as above, similar to prior studies.   Electronically Signed   By: Trudie Reedaniel  Entrikin M.D.   On: 08/21/2013 17:59   Ct Head Wo Contrast  08/21/2013    CLINICAL DATA:  Evaluate stroke.  EXAM: CT HEAD WITHOUT CONTRAST  TECHNIQUE: Contiguous axial images were obtained from the base of the skull through the vertex without intravenous contrast.  COMPARISON:  CT PORT HEAD WO CM dated 08/15/2013  FINDINGS: There is no evidence of mass effect, midline shift, or extra-axial fluid collections. There is no evidence of a space-occupying lesion or intracranial hemorrhage. There is no evidence of a cortical-based area of acute infarction. There is an old left cerebellar infarct. There is generalized cerebral atrophy. There is periventricular white matter low attenuation likely secondary to microangiopathy.  The ventricles and sulci are appropriate for the patient's age. The basal cisterns are patent.  Visualized portions of the orbits are unremarkable. There is opacification of bilateral mastoid air cells, right greater than left. Cerebrovascular atherosclerotic calcifications are noted.  The osseous structures are unremarkable.  IMPRESSION: 1. No acute intracranial pathology. 2. Complete opacification of the right mastoid air cells and partial left mastoid opacification.   Electronically Signed   By: Elige KoHetal  Patel   On: 08/21/2013 17:39   Ct Chest Wo Contrast  08/21/2013   CLINICAL DATA:  Evaluate for abscess.  EXAM: CT CHEST, ABDOMEN AND PELVIS WITHOUT CONTRAST  TECHNIQUE: Multidetector CT imaging of the chest, abdomen and pelvis was performed following the standard protocol without IV contrast.  COMPARISON:  CT of the abdomen and pelvis 08/06/2013.  FINDINGS: CT CHEST FINDINGS  Mediastinum: Tracheostomy tube in position with tip in the trachea immediately above the level of the aortic arch.  Left internal jugular central venous catheter with tip in the mid superior vena cava. Right internal jugular central venous catheter with tip terminating in the distal superior vena cava. Nasogastric tube extending into the antral pre-pyloric region of the stomach. Heart size is mildly  enlarged. There is no significant pericardial fluid, thickening or pericardial calcification. There is atherosclerosis of the thoracic aorta, the great vessels of the mediastinum and the coronary arteries, including calcified atherosclerotic plaque in the left main, left anterior descending, left circumflex and right coronary arteries. 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 Numerous reactive size mediastinal lymph nodes. Please note that accurate exclusion of hilar adenopathy is limited on noncontrast CT scans.  Lungs/Pleura: Widespread interstitial and airspace disease throughout the lungs bilaterally, predominantly in a peribronchovascular distribution, most suggestive of severe multilobar pneumonia. Near complete atelectasis of the left lower lobe with significant passive atelectasis in the right lower lobe as well. Moderate to large bilateral pleural effusions layering dependently are simple in appearance on today's non contrast CT examination.  Musculoskeletal: Median sternotomy wires. There are no aggressive appearing lytic or blastic lesions noted in the visualized portions of the skeleton.  CT ABDOMEN AND PELVIS FINDINGS  Abdomen/Pelvis: Intermediate to high attenuation material filling the gallbladder, favored to reflect biliary sludge. No current findings to suggest acute cholecystitis at this time. The unenhanced appearance of the liver, pancreas, spleen and bilateral adrenal glands is unremarkable. Low-attenuation lesions again noted in the kidneys bilaterally, incompletely characterized on today's non contrast CT scan, largest of which measures 6.8 cm in the upper pole of the right kidney (previously characterized as simple cysts). Extensive atherosclerosis throughout the abdominal and pelvic vasculature, without evidence of aneurysm. Trace volume of ascites. Mesenteric edema. Status post partial sigmoidectomy with left lower quadrant colostomy. Parastomal hernia containing  predominantly omental fat, similar to the prior study.  Image 87 and 88 of series 2 again demonstrate what appears to be in a defect in the lateral wall of a loop of small bowel in the left lower quadrant of the abdomen (likely jejunal), through which enteric contrast extends extraluminally. This then extends along the fistulous tract toward the anterior abdominal wall, although the exact delineation of the tract is not as well demonstrated as on recent prior examination 08/13/2013. 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 Several small locules of gas are also noted in the deep subcutaneous fat of the anterior abdominal wall in the area of the fistula.  Musculoskeletal: There are no aggressive appearing lytic or blastic lesions noted in the visualized portions of the skeleton. Diffuse body wall edema similar to the prior study.  IMPRESSION: 1. Persistent evidence of enterocutaneous fistula, the origin of which appears to be a defect in the lateral wall of a loop of small bowel in the left lower quadrant (likely jejunal), as above. 2. The appearance of the lungs is most compatible with severe multilobar pneumonia. Alternatively, similar findings could be seen in the setting of ARDS. Clinical correlation is recommended. 3. Moderate to large bilateral pleural effusions with complete atelectasis of the left lower lobe and significant passive atelectasis in the right lower lobe. 4. Gallbladder likely contains a large amount of biliary sludge. No current findings to suggest acute cholecystitis at this time. 5. Multiple additional findings, as above, similar to prior studies.   Electronically Signed   By:  Trudie Reed M.D.   On: 08/21/2013 17:59   Dg Chest Port 1 View  08/22/2013   CLINICAL DATA:  Assess pulmonary infiltrates and tracheostomy  position  EXAM: PORTABLE CHEST - 1 VIEW  COMPARISON:  CT CHEST W/O CM dated 08/21/2013; DG CHEST 1V PORT dated 08/21/2013  FINDINGS: The lungs are adequately inflated. The interstitial markings remain increased bilaterally and are confluent in the right mid lung and in the retrocardiac region on the left. The left hemidiaphragm remains obscured. The cardiopericardial silhouette remains mildly enlarged. The pulmonary vascularity remains prominent centrally. The patient has undergone previous CABG.  The large caliber right internal jugular venous catheter is unchanged in position. The left internal jugular venous catheter tip also appears stable. The tracheostomy appliance tip lies of the level of the clavicular heads. The esophagogastric tube tip and proximal port project below the left hemidiaphragm.  IMPRESSION: 1. There are stable appearing pulmonary parenchymal interstitial and alveolar densities consistent with CHF. As has been previously mentioned ARDS may be developing. There has not been dramatic interval change since yesterday's study. 2. The support tubes and lines appear to be in appropriate position.   Electronically Signed   By: David  [place]   On: 08/22/2013 07:26    Anti-infectives: Anti-infectives   Start     Dose/Rate Route Frequency Ordered Stop   08/19/13 1000  linezolid (ZYVOX) IVPB 600 mg     600 mg 300 mL/hr over 60 Minutes Intravenous Every 12 hours 08/19/13 0842     08/15/13 1200  micafungin (MYCAMINE) 100 mg in sodium chloride 0.9 % 100 mL IVPB     100 mg 100 mL/hr over 1 Hours Intravenous Daily 08/15/13 1134     08/10/13 0600  imipenem-cilastatin (PRIMAXIN) 250 mg in sodium chloride 0.9 % 100 mL IVPB     250 mg 200 mL/hr over 30 Minutes Intravenous 3 times per day 08/09/13 0021     08/09/13 1000  imipenem-cilastatin (PRIMAXIN) 250 mg in sodium chloride 0.9 % 100 mL IVPB  Status:  Discontinued     250 mg 200 mL/hr over 30 Minutes Intravenous Every 12 hours 08/09/13 0019  08/09/13 0021   08/08/13 1000  micafungin (MYCAMINE) 100 mg in sodium chloride 0.9 % 100 mL IVPB  Status:  Discontinued     100 mg 100 mL/hr over 1 Hours Intravenous Daily 08/08/13 0950 08/08/13 1001   08/04/2013 1800  vancomycin (VANCOCIN) IVPB 1000 mg/200 mL premix  Status:  Discontinued     1,000 mg 200 mL/hr over 60 Minutes Intravenous Every 24 hours 08/22/2013 1625 08/11/13 1057   08/25/2013 1700  imipenem-cilastatin (PRIMAXIN) 250 mg in sodium chloride 0.9 % 100 mL IVPB  Status:  Discontinued     250 mg 200 mL/hr over 30 Minutes Intravenous 3 times per day 08/24/2013 1624 08/09/13 0019      Assessment/Plan:   LOS: 16 days   S/p Hartman's procedure at [facility] complicated by [facility] fistula  PCM  -TPN  -fistula controlled with Eakin's pouch  CT negative for abscess  Prognosis poor at this point.  Palliative care consult may be helpful.   Donnah Levert A. 08/29/2013

## 2013-09-04 NOTE — Progress Notes (Signed)
Extensive discussion with family. We discussed the poor prognosis and likely poor quality of life. Family has decided to offer full comfort care. They are aware that the patient may be transferred to palliative care floor for continued comfort care needs. They have been fully updated on the process and expectations. Daniel J. Feinstein, MD, FACP Pgr: [phone] [facility]   

## 2013-09-04 DEATH — deceased

## 2014-09-08 IMAGING — CT CT HEAD W/O CM
1 series · 15 of 30 positions shown, 19 images · non-contrast
Comparison: CT PORT HEAD WO CM dated 08/15/2013

CLINICAL DATA: Evaluate stroke.

EXAM:
CT HEAD WITHOUT CONTRAST
TECHNIQUE: Contiguous axial images were obtained from the base of the skull
through the vertex without intravenous contrast.

[Series 2: head 5.0 h30s · axial · 0.42mm/px · z∈[-129,+16]mm · 15 of 33 slices shown, 19 images]
[im 2/33  brain]
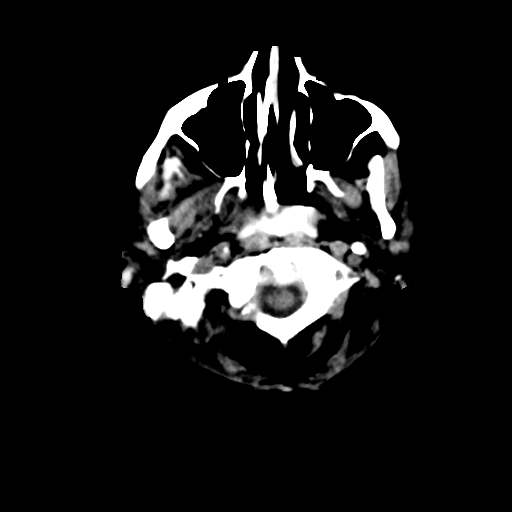
[im 2/33  bone]
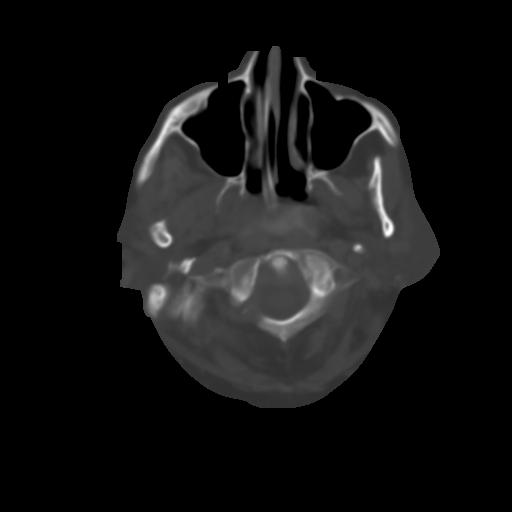
[im 4/33  brain]
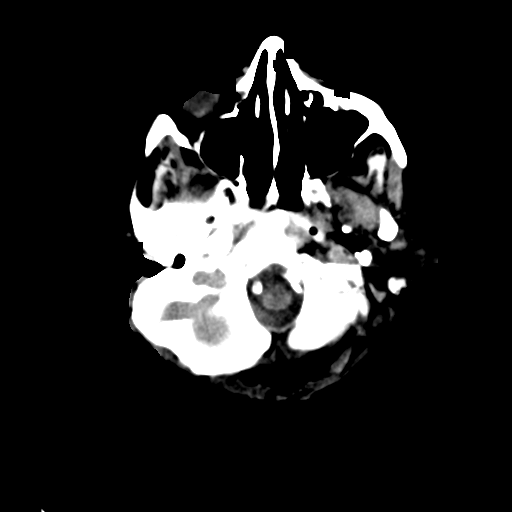
[im 6/33  brain]
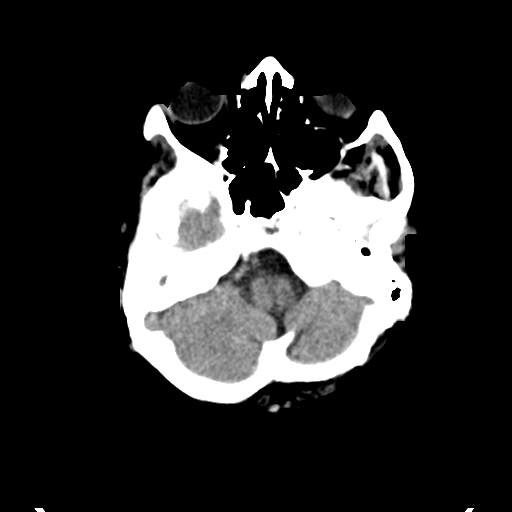
[im 8/33  brain]
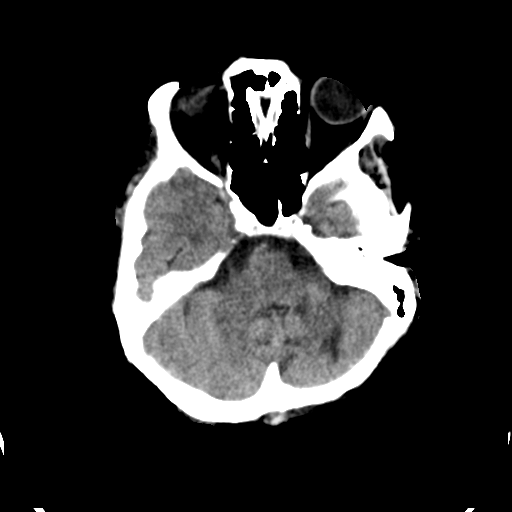
[im 10/33  brain]
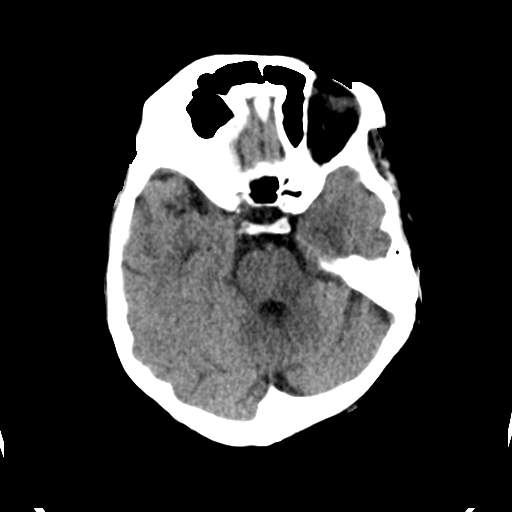
[im 10/33  bone]
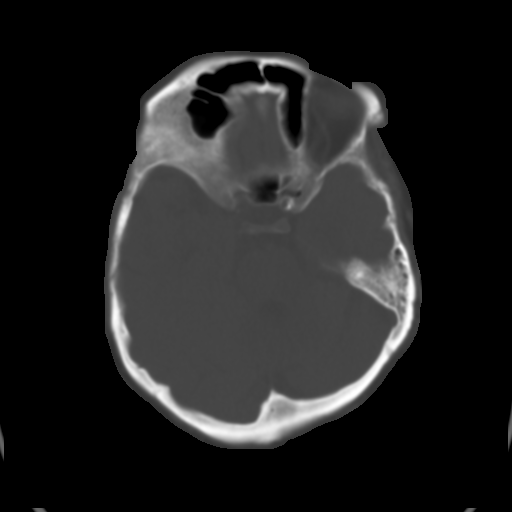
[im 13/33  brain]
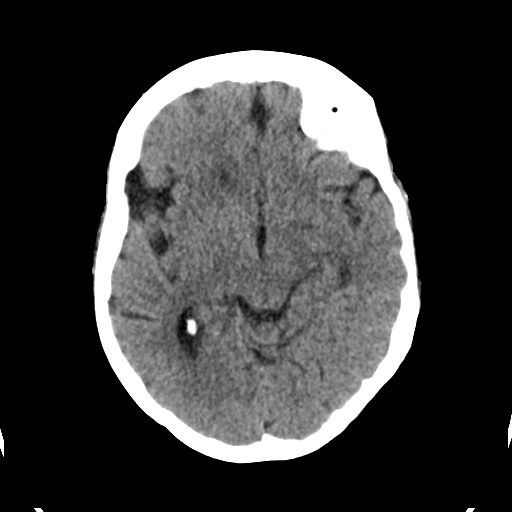
[im 15/33  brain]
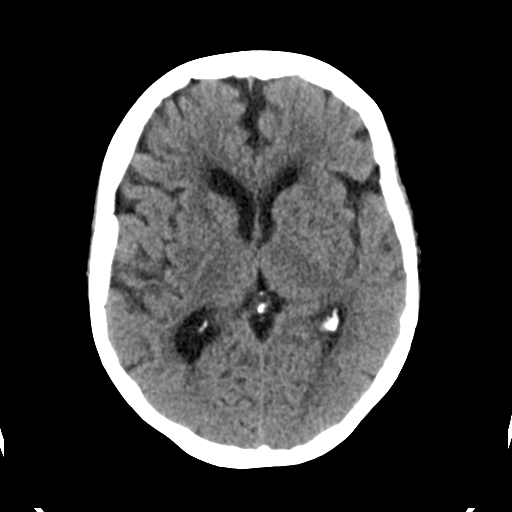
[im 17/33  brain]
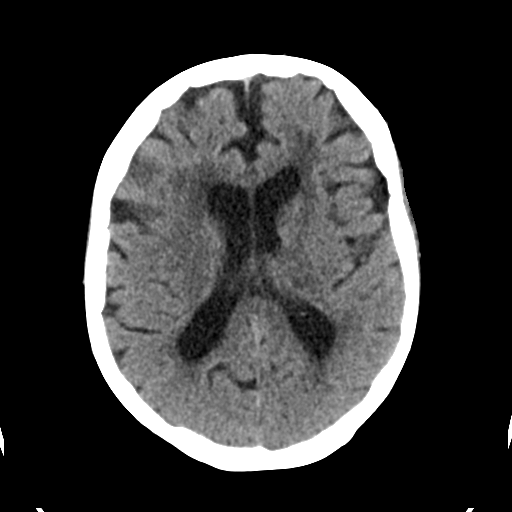
[im 18/33  brain]
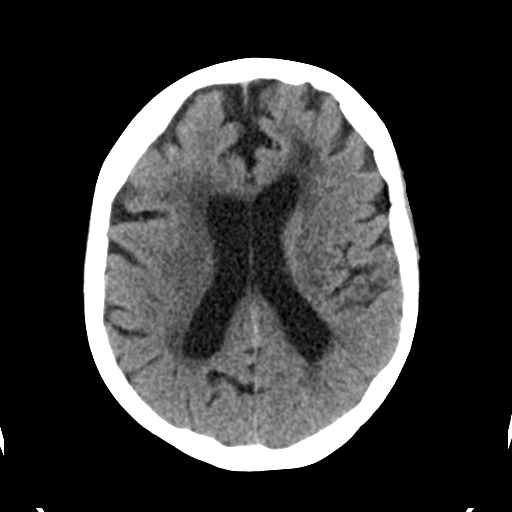
[im 18/33  bone]
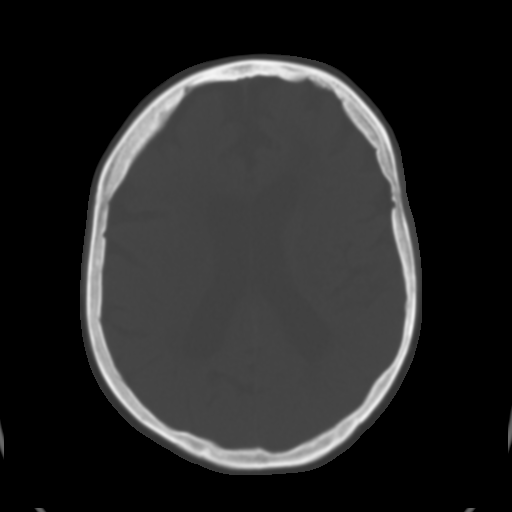
[im 20/33  brain]
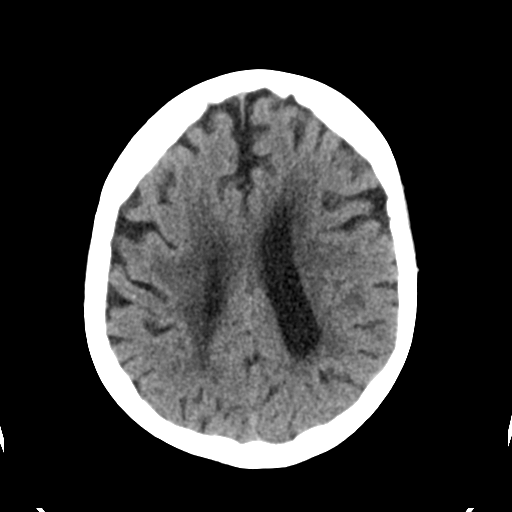
[im 23/33  brain]
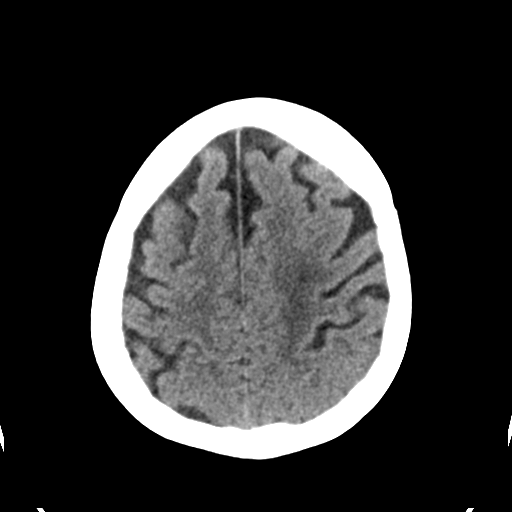
[im 25/33  brain]
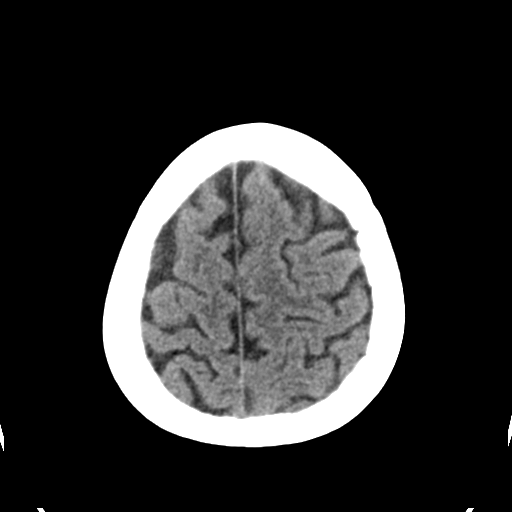
[im 27/33  brain]
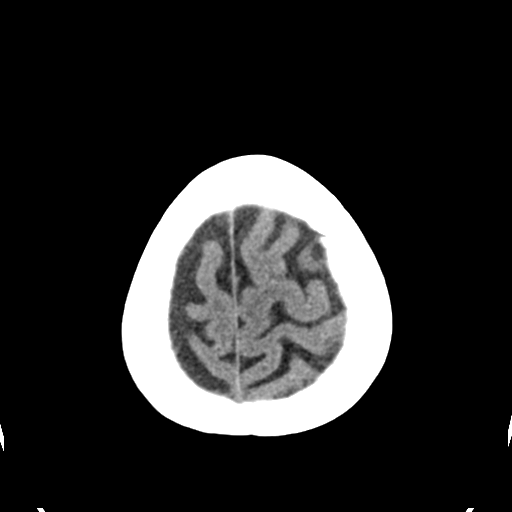
[im 27/33  bone]
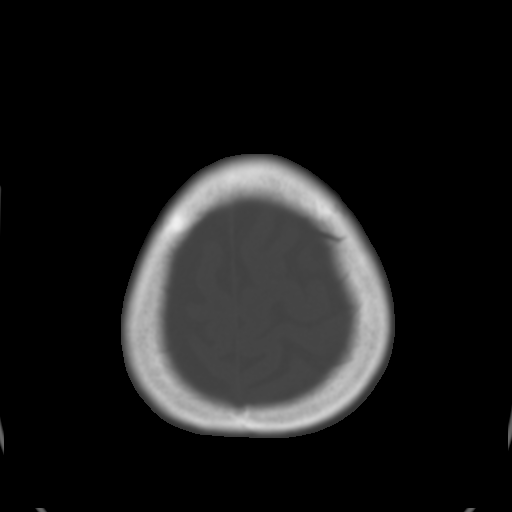
[im 29/33  brain]
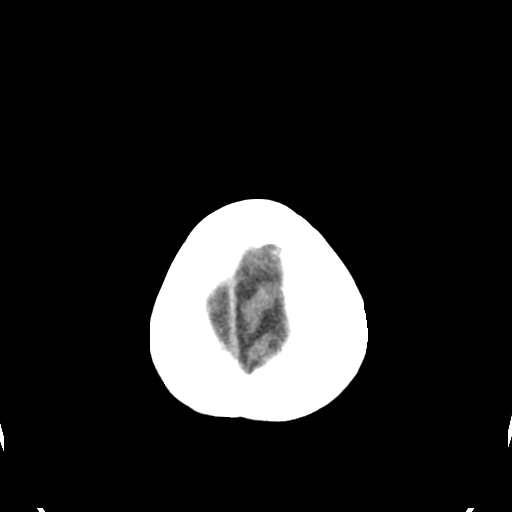
[im 31/33  brain]
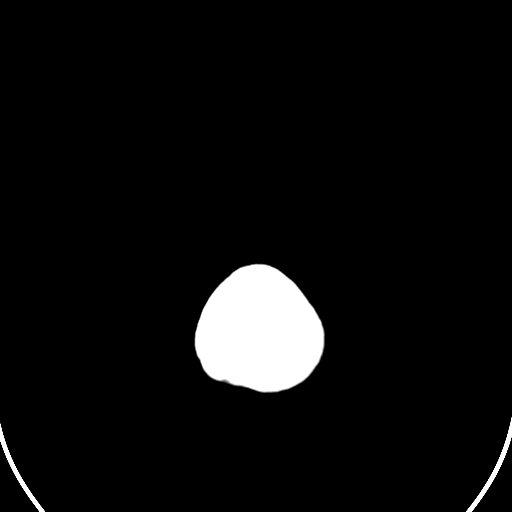

[15 of 30 positions shown; findings below may reference images not displayed]

FINDINGS: There is no evidence of mass effect, midline shift, or extra-axial
fluid collections. There is no evidence of a space-occupying lesion
or intracranial hemorrhage. There is no evidence of a cortical-based
area of acute infarction. There is an old left cerebellar infarct.
There is generalized cerebral atrophy. There is periventricular
white matter low attenuation likely secondary to microangiopathy.

The ventricles and sulci are appropriate for the patient's age. The
basal cisterns are patent.

Visualized portions of the orbits are unremarkable. There is
opacification of bilateral mastoid air cells, right greater than
left. Cerebrovascular atherosclerotic calcifications are noted.

The osseous structures are unremarkable.
IMPRESSION: 1. No acute intracranial pathology.
2. Complete opacification of the right mastoid air cells and partial
left mastoid opacification.
